# Patient Record
Sex: Male | Born: 1965 | Race: Black or African American | Hispanic: No | Smoking: Former smoker
Health system: Southern US, Community
[De-identification: ages and names within clinical notes are randomized; demographics above are authoritative.]

## PROBLEM LIST (undated history)

## (undated) DIAGNOSIS — J189 Pneumonia, unspecified organism: Secondary | ICD-10-CM

## (undated) DIAGNOSIS — C3411 Malignant neoplasm of upper lobe, right bronchus or lung: Secondary | ICD-10-CM

## (undated) DIAGNOSIS — E785 Hyperlipidemia, unspecified: Secondary | ICD-10-CM

## (undated) DIAGNOSIS — B1920 Unspecified viral hepatitis C without hepatic coma: Secondary | ICD-10-CM

## (undated) DIAGNOSIS — F1010 Alcohol abuse, uncomplicated: Secondary | ICD-10-CM

## (undated) DIAGNOSIS — I10 Essential (primary) hypertension: Secondary | ICD-10-CM

## (undated) HISTORY — PX: NO PAST SURGERIES: SHX2092

## (undated) HISTORY — DX: Unspecified viral hepatitis C without hepatic coma: B19.20

## (undated) HISTORY — DX: Hyperlipidemia, unspecified: E78.5

## (undated) HISTORY — DX: Alcohol abuse, uncomplicated: F10.10

## (undated) HISTORY — DX: Essential (primary) hypertension: I10

## (undated) HISTORY — DX: Malignant neoplasm of upper lobe, right bronchus or lung: C34.11

---

## 2000-09-09 ENCOUNTER — Emergency Department (HOSPITAL_COMMUNITY): Admission: EM | Admit: 2000-09-09 | Discharge: 2000-09-09 | Payer: Self-pay

## 2000-09-16 ENCOUNTER — Emergency Department (HOSPITAL_COMMUNITY): Admission: EM | Admit: 2000-09-16 | Discharge: 2000-09-16 | Payer: Self-pay | Admitting: Emergency Medicine

## 2004-12-17 ENCOUNTER — Emergency Department: Payer: Self-pay | Admitting: Emergency Medicine

## 2005-07-19 ENCOUNTER — Emergency Department: Payer: Self-pay | Admitting: Emergency Medicine

## 2005-07-20 ENCOUNTER — Emergency Department: Payer: Self-pay | Admitting: Emergency Medicine

## 2005-07-20 ENCOUNTER — Other Ambulatory Visit: Payer: Self-pay

## 2007-01-04 ENCOUNTER — Emergency Department: Payer: Self-pay | Admitting: Emergency Medicine

## 2009-06-20 ENCOUNTER — Emergency Department: Payer: Self-pay | Admitting: Emergency Medicine

## 2011-02-28 ENCOUNTER — Emergency Department: Payer: Self-pay | Admitting: Unknown Physician Specialty

## 2011-03-09 ENCOUNTER — Emergency Department: Payer: Self-pay | Admitting: Emergency Medicine

## 2011-04-14 ENCOUNTER — Emergency Department: Payer: Self-pay | Admitting: Internal Medicine

## 2011-11-19 ENCOUNTER — Ambulatory Visit: Payer: Self-pay | Admitting: Family Medicine

## 2011-11-23 LAB — HM HEPATITIS C SCREENING LAB: HM HEPATITIS C SCREENING: POSITIVE

## 2011-11-26 ENCOUNTER — Ambulatory Visit: Payer: Self-pay | Admitting: Family Medicine
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
## 2013-06-09 IMAGING — CR DG RIBS 2V*R*
1 series · 4 of 4 positions shown · non-contrast
Comparison: none

REASON FOR EXAM: rib pain
COMMENTS:

[Series 1: ap · 0.17mm/px · 4 of 4 slices shown]
[im 1/4]
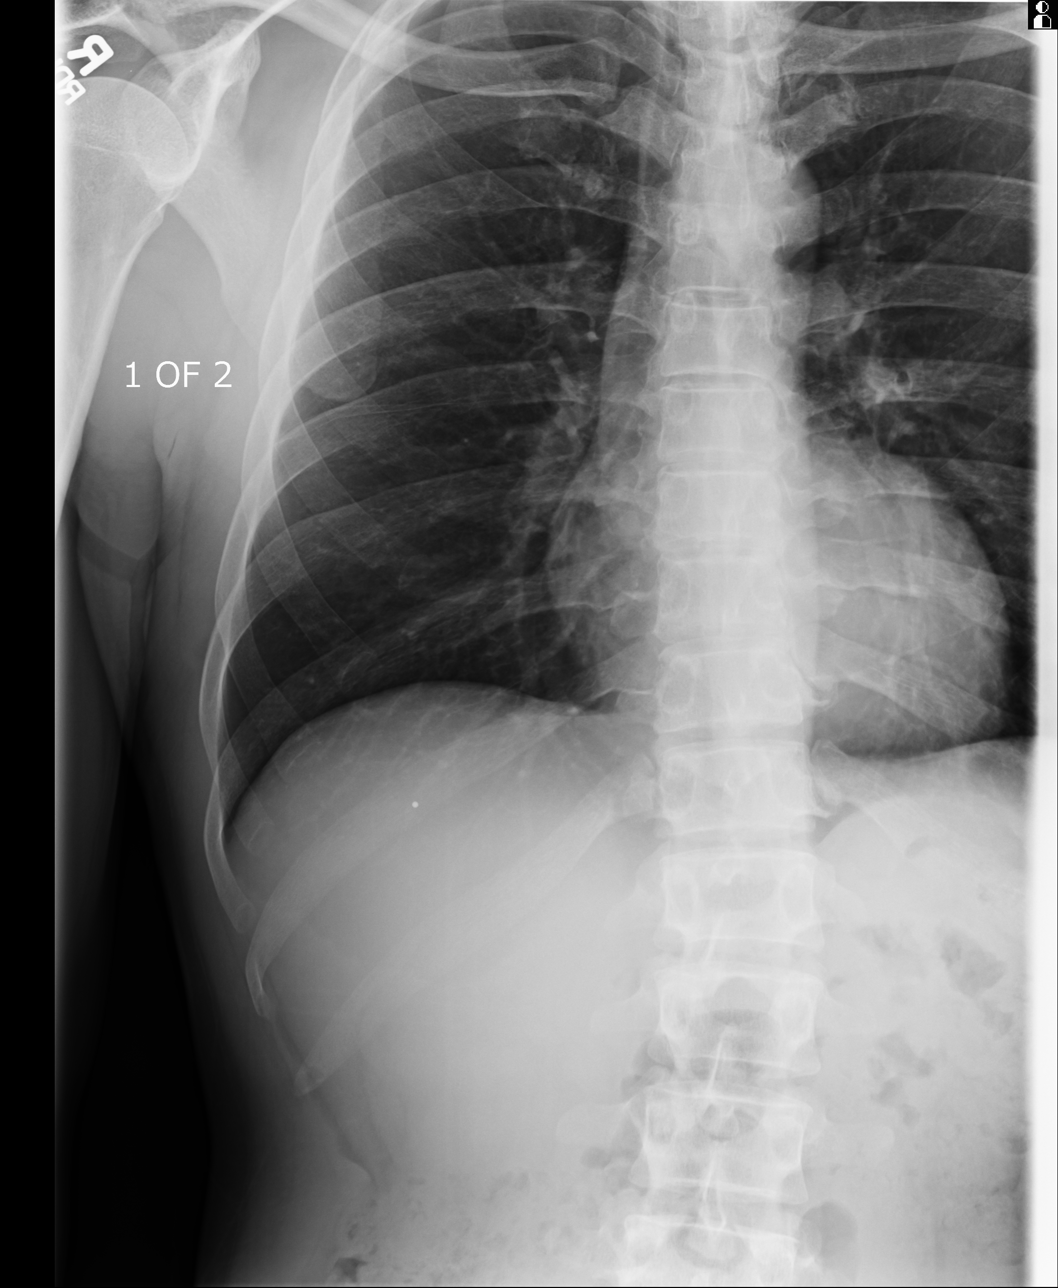
[im 2/4]
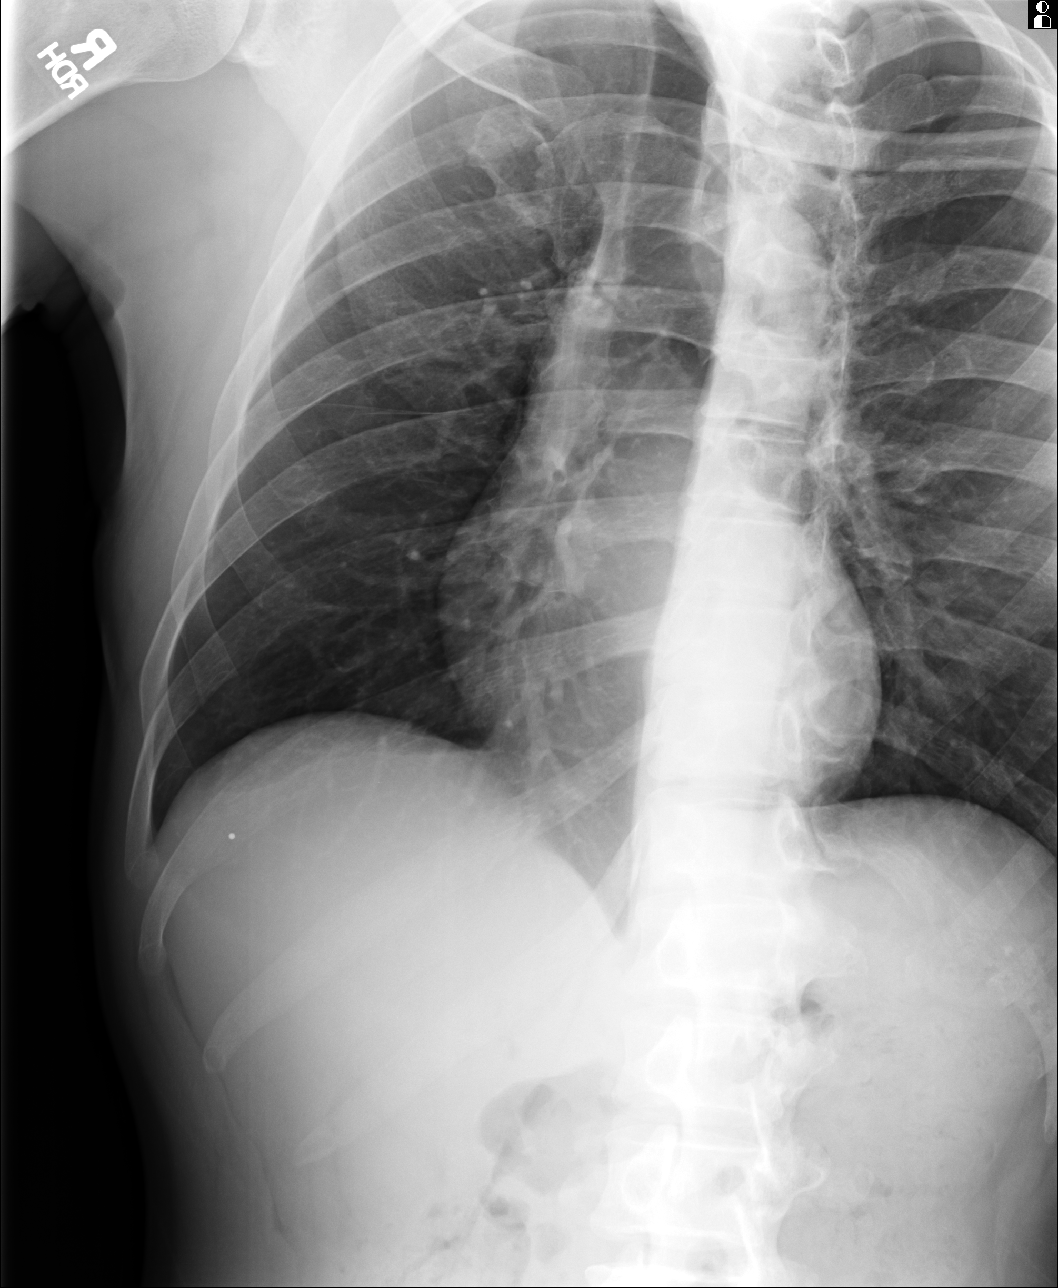
[im 3/4]
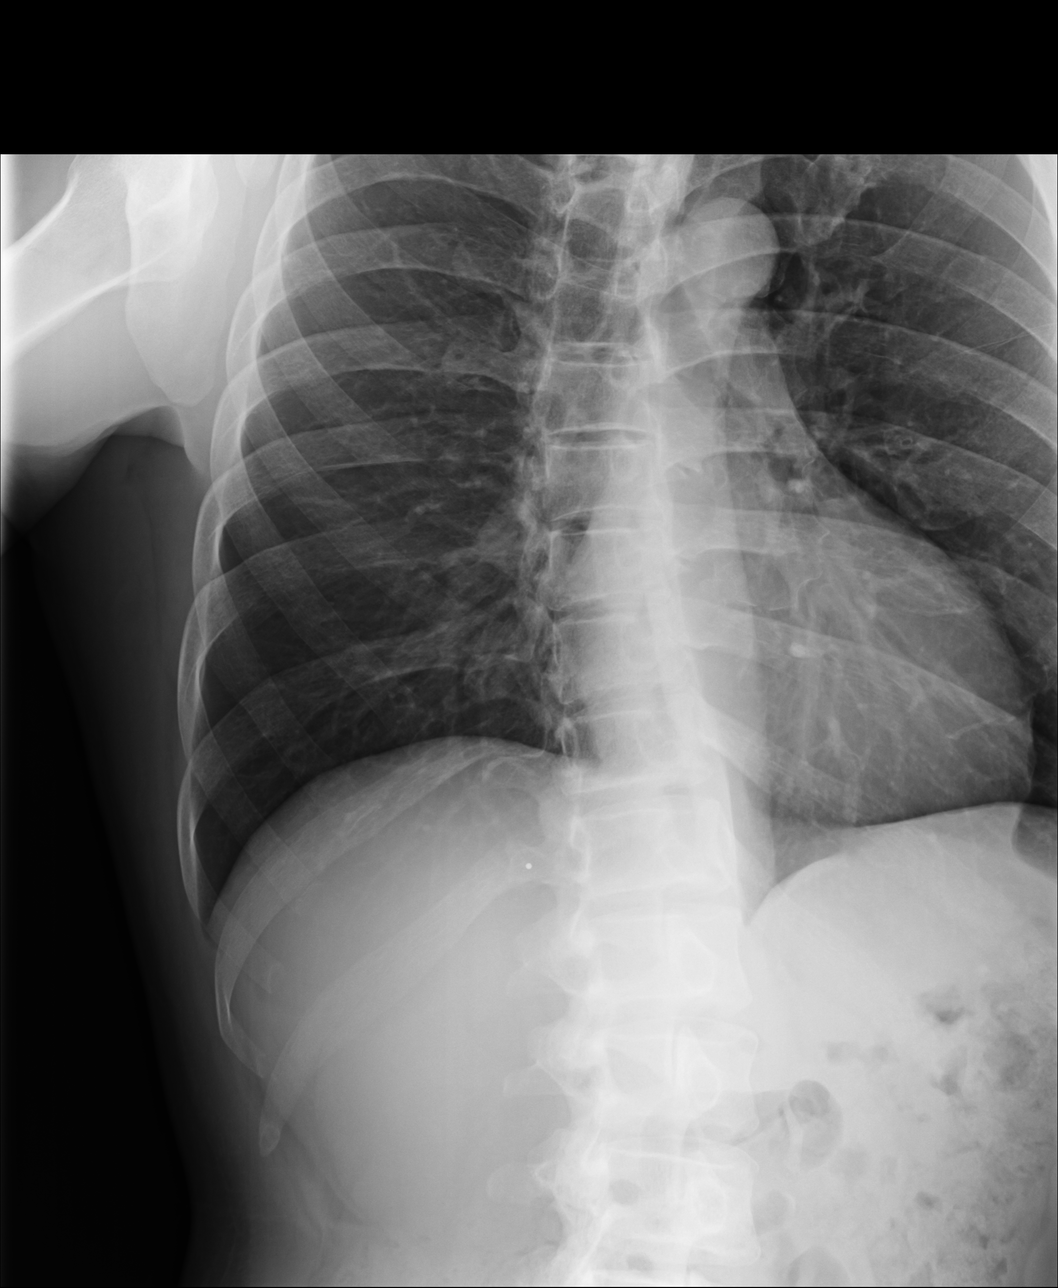
[im 4/4]
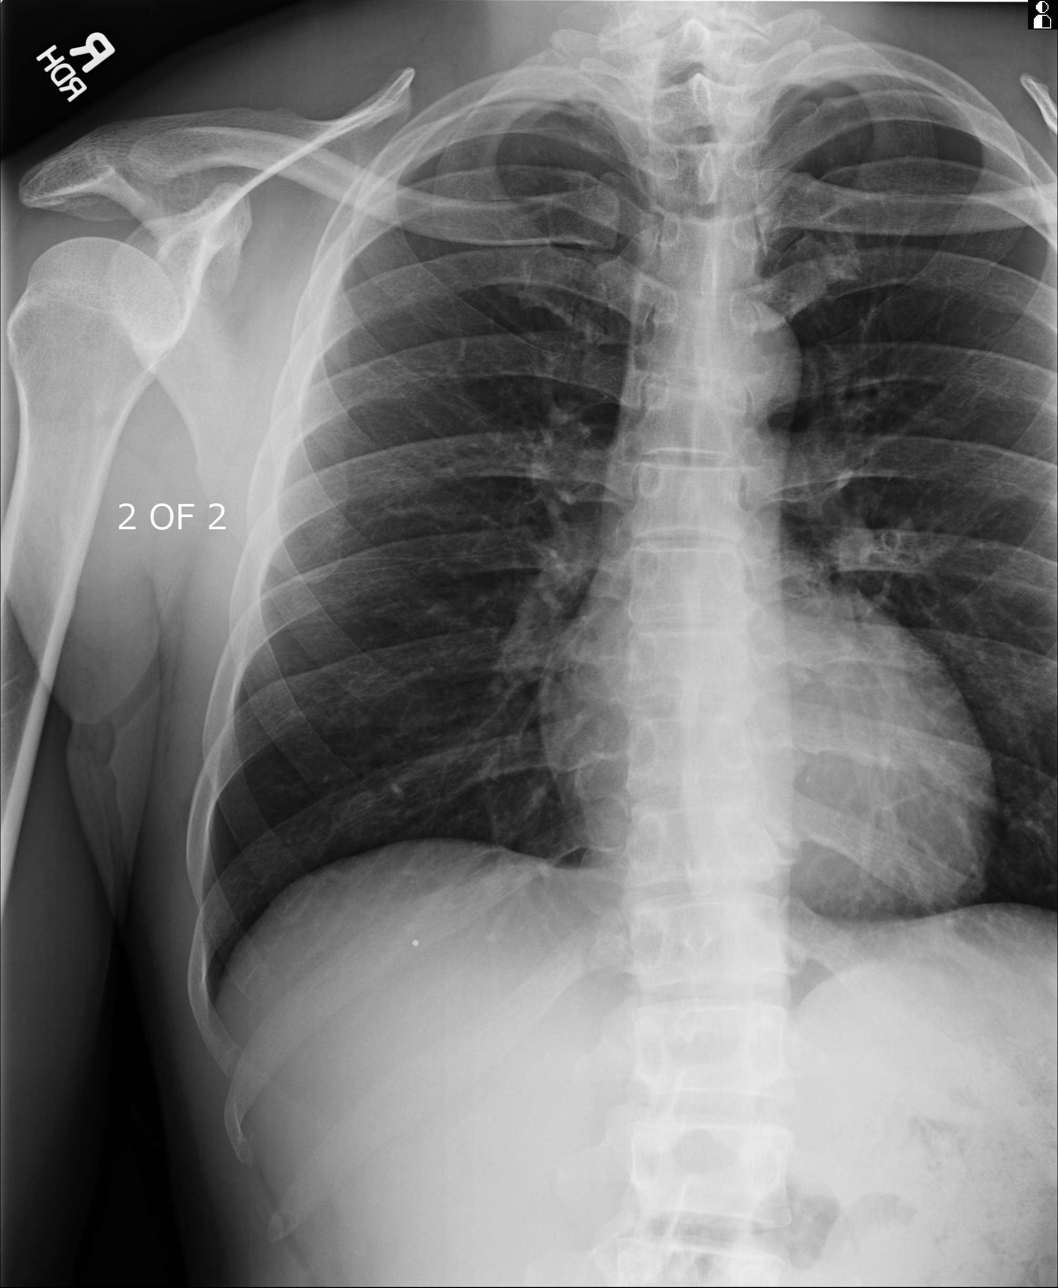

[4 of 4 positions shown; findings below may reference images not displayed]

PROCEDURE:     KDR - KDXR RIBS RIGHT UNILATERAL  - November 19, 2011 [DATE]

RESULT:     Multiple views of the right ribs were obtained. A metallic
marker was placed at the site of maximum symptomatology prior to
radiography. No fracture or other acute bony abnormality is identified. No
pneumothorax or pleural effusion is seen.
IMPRESSION: 1.     No rib fracture or other significant osseous abnormality of the right
ribs is identified.

## 2013-06-16 IMAGING — US ABDOMEN ULTRASOUND
1 series · 17 of 25 positions shown · non-contrast
Comparison: none

REASON FOR EXAM: complete   upper abd pain
COMMENTS:

[Series 1: abdomen ultrasound · 17 of 86 slices shown]
[im 1/86]
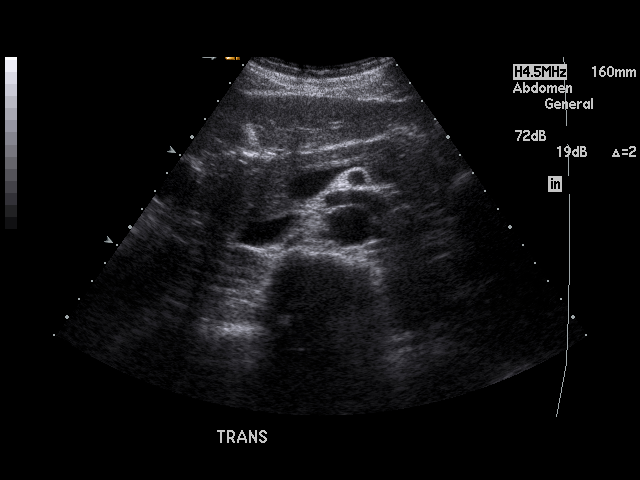
[im 8/86]
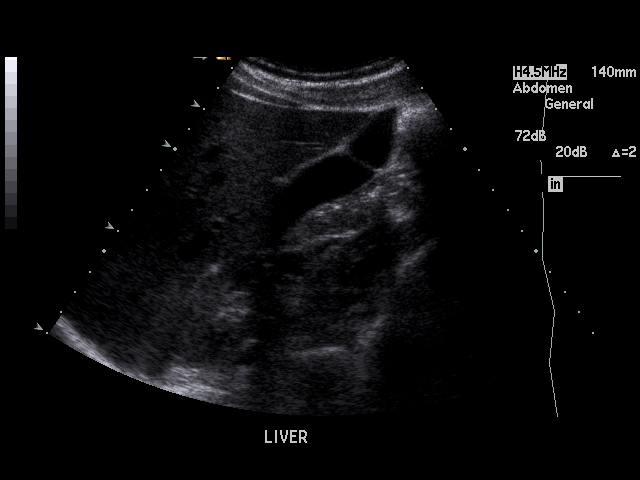
[im 11/86]
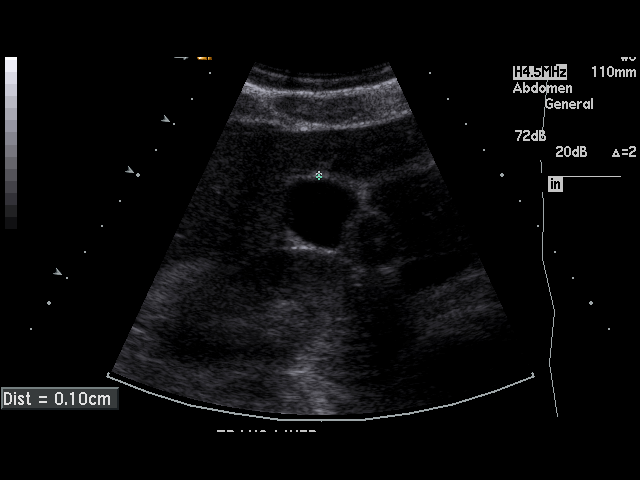
[im 18/86]
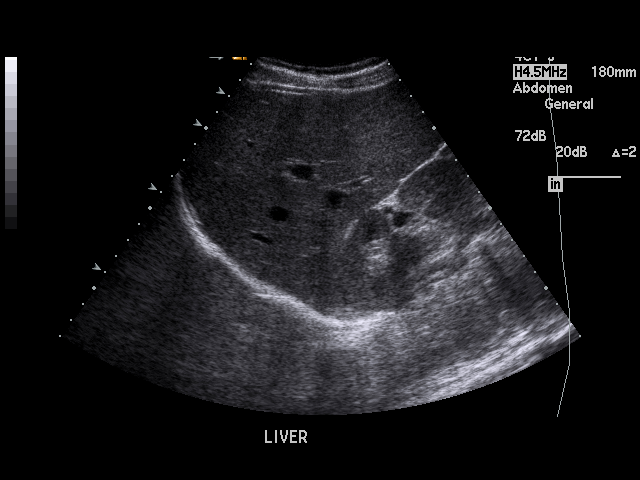
[im 22/86]
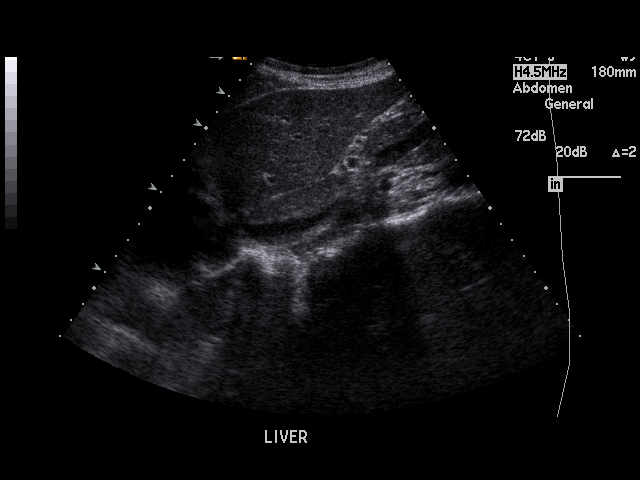
[im 29/86]
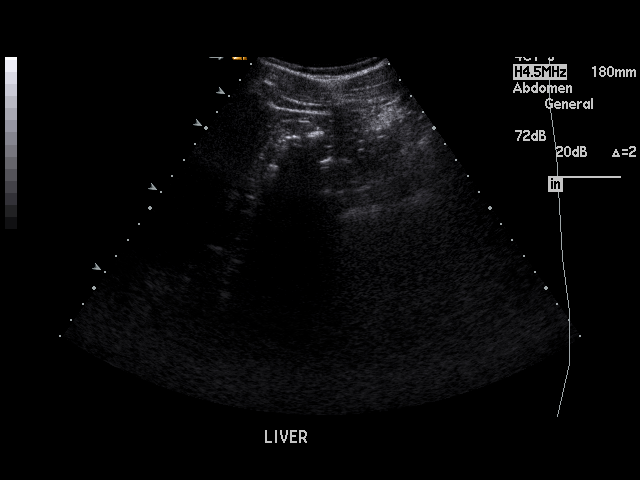
[im 32/86]
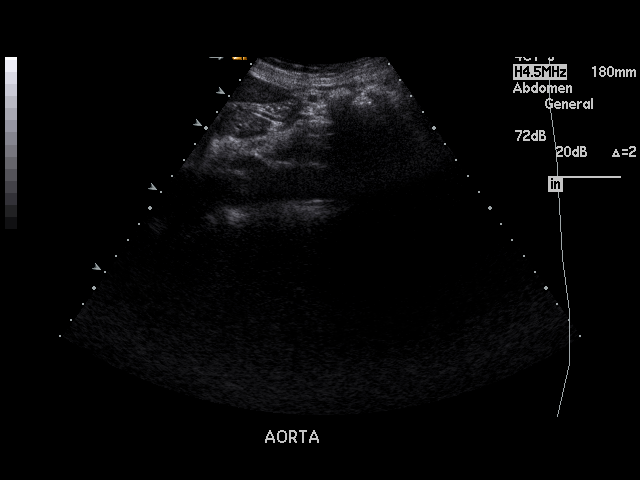
[im 39/86]
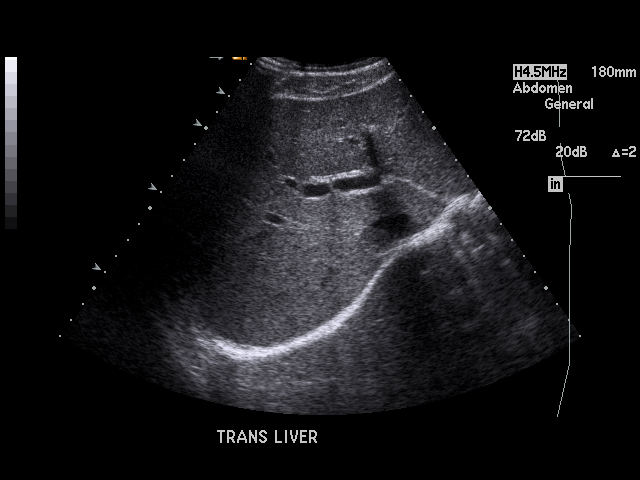
[im 43/86]
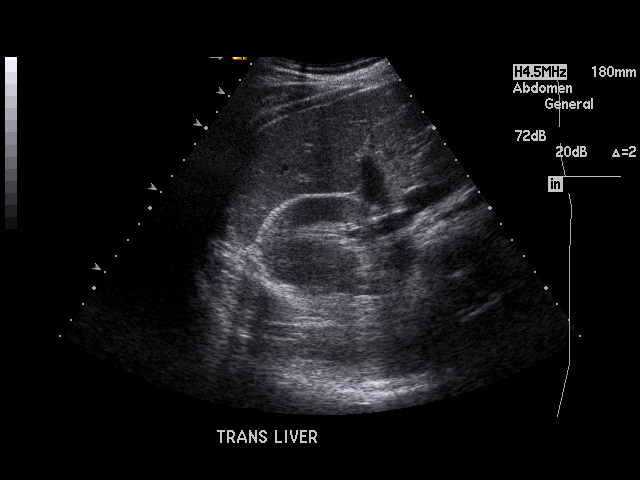
[im 47/86]
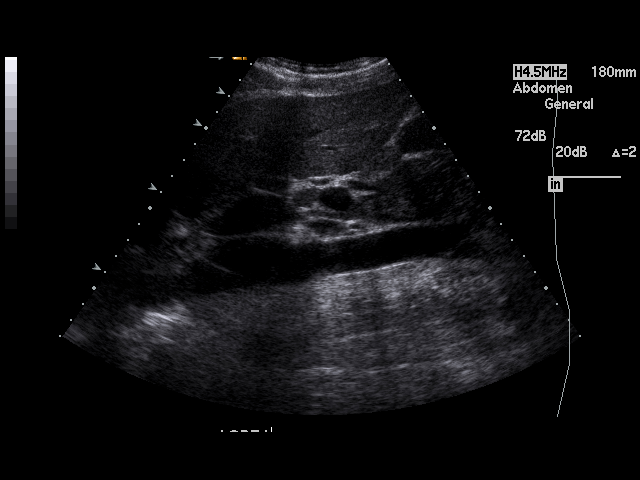
[im 54/86]
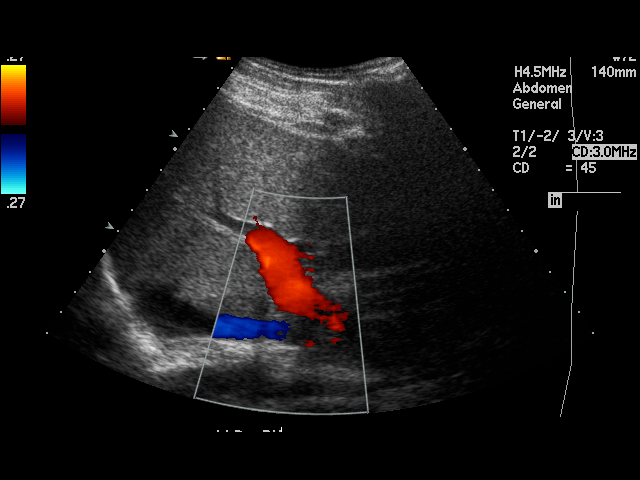
[im 57/86]
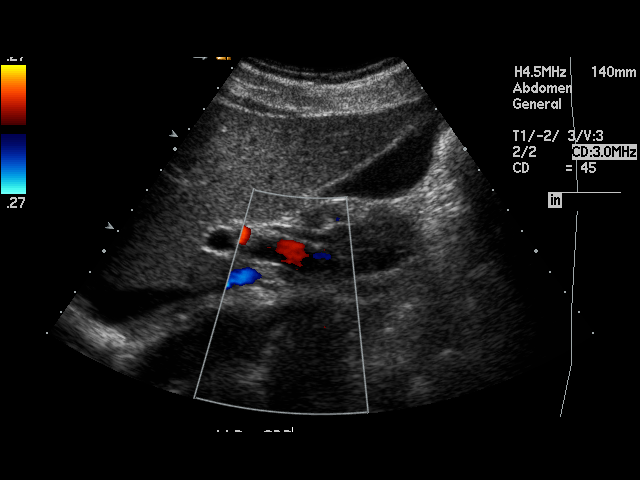
[im 64/86]
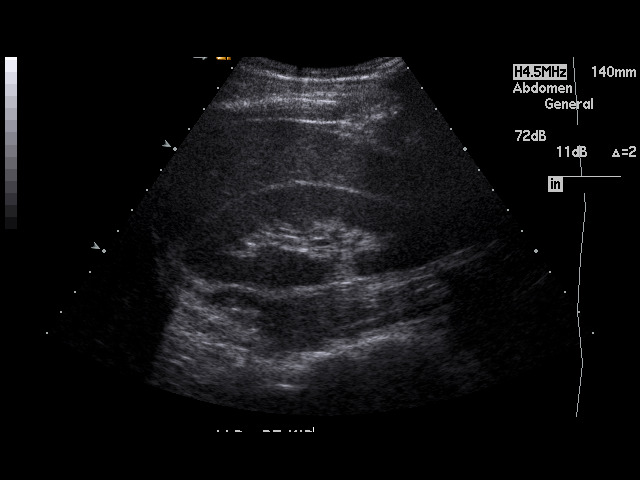
[im 68/86]
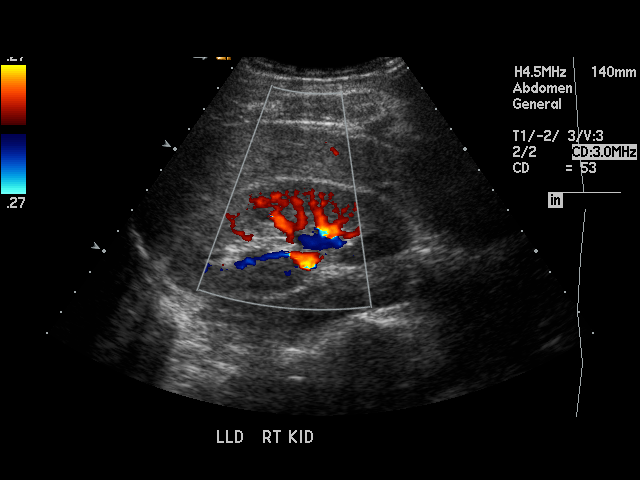
[im 75/86]
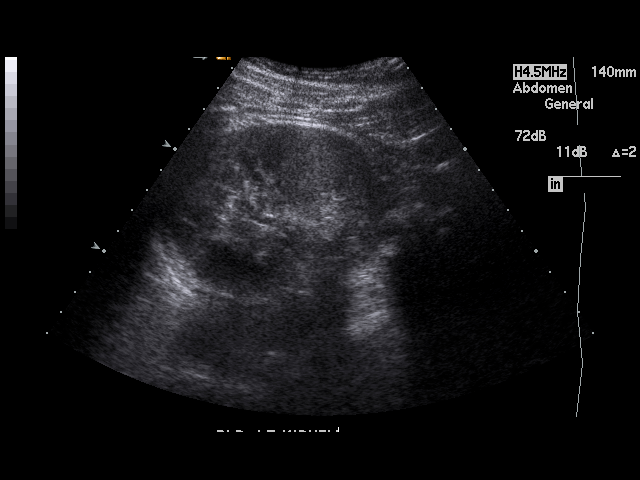
[im 78/86]
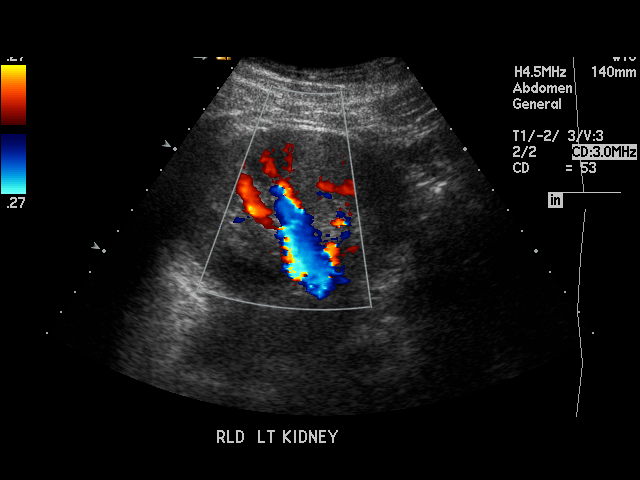
[im 86/86]
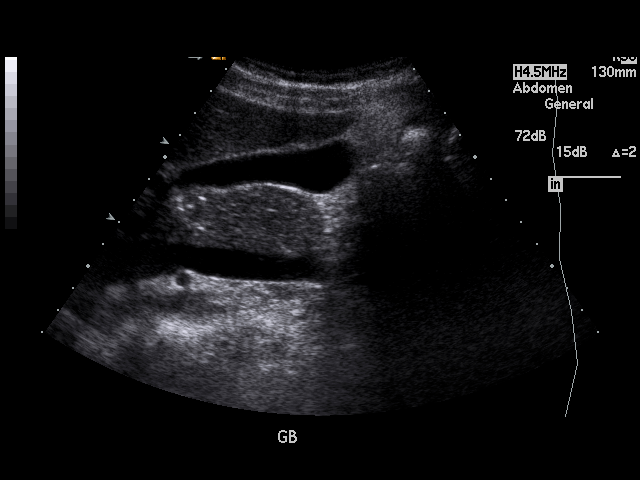

[17 of 25 positions shown; findings below may reference images not displayed]

PROCEDURE:     US  - US ABDOMEN GENERAL SURVEY  - November 26, 2011 [DATE]

RESULT:     The liver exhibits normal echotexture with no focal mass or
ductal dilation. Portal venous flow direction appears normal toward the
liver. The gallbladder is adequately distended with no evidence of stones,
wall thickening, or pericholecystic fluid. The common bile duct is normal at
5 mm in diameter.

The pancreas, spleen, abdominal aorta, inferior vena cava, and kidneys are
normal in appearance.
IMPRESSION: Normal abdominal ultrasound examination.

## 2014-09-16 ENCOUNTER — Ambulatory Visit: Payer: Self-pay | Admitting: Family Medicine

## 2014-09-16 LAB — CBC WITH DIFFERENTIAL/PLATELET
BASOS ABS: 0 10*3/uL (ref 0.0–0.1)
Basophil %: 0.9 %
EOS ABS: 0.1 10*3/uL (ref 0.0–0.7)
Eosinophil %: 2.9 %
HCT: 49.6 % (ref 40.0–52.0)
HGB: 15.8 g/dL (ref 13.0–18.0)
LYMPHS ABS: 2.3 10*3/uL (ref 1.0–3.6)
LYMPHS PCT: 49.3 %
MCH: 29.3 pg (ref 26.0–34.0)
MCHC: 31.9 g/dL — ABNORMAL LOW (ref 32.0–36.0)
MCV: 92 fL (ref 80–100)
MONOS PCT: 13.3 %
Monocyte #: 0.6 x10 3/mm (ref 0.2–1.0)
Neutrophil #: 1.6 10*3/uL (ref 1.4–6.5)
Neutrophil %: 33.6 %
Platelet: 136 10*3/uL — ABNORMAL LOW (ref 150–440)
RBC: 5.4 10*6/uL (ref 4.40–5.90)
RDW: 13.7 % (ref 11.5–14.5)
WBC: 4.6 10*3/uL (ref 3.8–10.6)

## 2014-09-16 LAB — COMPREHENSIVE METABOLIC PANEL
ALT: 138 U/L — AB
AST: 97 U/L — AB (ref 15–37)
Albumin: 3.8 g/dL (ref 3.4–5.0)
Alkaline Phosphatase: 53 U/L
Anion Gap: 4 — ABNORMAL LOW (ref 7–16)
BUN: 9 mg/dL (ref 7–18)
Bilirubin,Total: 0.4 mg/dL (ref 0.2–1.0)
CALCIUM: 9.3 mg/dL (ref 8.5–10.1)
Chloride: 106 mmol/L (ref 98–107)
Co2: 29 mmol/L (ref 21–32)
Creatinine: 0.96 mg/dL (ref 0.60–1.30)
EGFR (African American): >60
GLUCOSE: 105 mg/dL — AB (ref 65–99)
OSMOLALITY: 277 (ref 275–301)
Potassium: 4.1 mmol/L (ref 3.5–5.1)
Sodium: 139 mmol/L (ref 136–145)
Total Protein: 8 g/dL (ref 6.4–8.2)

## 2014-09-16 LAB — LIPID PANEL
CHOLESTEROL: 158 mg/dL (ref 0–200)
HDL Cholesterol: 67 mg/dL — ABNORMAL HIGH (ref 40–60)
LDL CHOLESTEROL, CALC: 83 mg/dL (ref 0–100)
Triglycerides: 42 mg/dL (ref 0–200)
VLDL Cholesterol, Calc: 8 mg/dL (ref 5–40)

## 2014-09-16 LAB — TSH: Thyroid Stimulating Horm: 1.23 u[IU]/mL

## 2014-09-17 LAB — PSA: PSA: 1.3 ng/mL (ref 0.0–4.0)

## 2014-10-23 ENCOUNTER — Ambulatory Visit: Payer: Self-pay | Admitting: Family Medicine

## 2014-10-23 LAB — COMPREHENSIVE METABOLIC PANEL
ANION GAP: 4 — AB (ref 7–16)
AST: 76 U/L — AB (ref 15–37)
Albumin: 3.4 g/dL (ref 3.4–5.0)
Alkaline Phosphatase: 48 U/L
BILIRUBIN TOTAL: 0.5 mg/dL (ref 0.2–1.0)
BUN: 9 mg/dL (ref 7–18)
CHLORIDE: 106 mmol/L (ref 98–107)
CO2: 30 mmol/L (ref 21–32)
Calcium, Total: 8.8 mg/dL (ref 8.5–10.1)
Creatinine: 0.94 mg/dL (ref 0.60–1.30)
Glucose: 81 mg/dL (ref 65–99)
Osmolality: 277 (ref 275–301)
Potassium: 3.7 mmol/L (ref 3.5–5.1)
SGPT (ALT): 99 U/L — ABNORMAL HIGH
Sodium: 140 mmol/L (ref 136–145)
Total Protein: 7.3 g/dL (ref 6.4–8.2)

## 2015-09-15 DIAGNOSIS — Z720 Tobacco use: Secondary | ICD-10-CM | POA: Insufficient documentation

## 2015-09-16 ENCOUNTER — Encounter: Payer: Self-pay | Admitting: Family Medicine

## 2015-09-16 ENCOUNTER — Ambulatory Visit (INDEPENDENT_AMBULATORY_CARE_PROVIDER_SITE_OTHER): Payer: PRIVATE HEALTH INSURANCE | Admitting: Family Medicine

## 2015-09-16 VITALS — BP 138/90 | HR 62 | Temp 97.9°F | Resp 14 | Wt 143.6 lb

## 2015-09-16 DIAGNOSIS — Z202 Contact with and (suspected) exposure to infections with a predominantly sexual mode of transmission: Secondary | ICD-10-CM

## 2015-09-16 MED ORDER — AZITHROMYCIN 1 G PO PACK: 2.0000 g | PACK | Freq: Once | ORAL | Status: DC

## 2015-09-16 NOTE — Progress Notes (Signed)
Patient ID: Nicholas Mccoy, male   DOB: October 21, 1965, 49 y.o.   MRN: [id number] Name: Nicholas Mccoy   MRN: [id number]    DOB: October 21, 1965   Date:09/16/2015       Progress Note  Subjective  Chief Complaint  Chief Complaint  Patient presents with  . Exposure to STD    Exposure to STD The patient's pertinent negatives include no genital itching or penile discharge. This is a new problem. The current episode started in the past 7 days. The patient is experiencing no pain. He is sexually active. Yes, his partner has an STD.   Past Surgical History  Procedure Laterality Date  . No past surgeries     Family History  Problem Relation Age of Onset  . Hypertension Mother   . Hypertension Father   . Heart disease Father   . Heart attack Father   . Heart attack Sister   . Hypertension Brother   . Healthy Brother   . Sickle cell anemia Sister    Patient Active Problem List   Diagnosis Date Noted  . Current tobacco use 09/15/2015    Social History  Substance Use Topics  . Smoking status: Current Every Day Smoker -- 0.50 packs/day for 25 years    Types: Cigarettes  . Smokeless tobacco: Never Used  . Alcohol Use: 0.0 oz/week    0 Standard drinks or equivalent per week     Comment: Moderate Use- 10 beers on the weekends    No current outpatient prescriptions on file.  No Known Allergies  Review of Systems  Constitutional: Negative.   HENT: Negative.   Eyes: Negative.   Respiratory: Negative.   Cardiovascular: Negative.   Gastrointestinal: Negative.   Genitourinary: Negative.  Negative for discharge.  Musculoskeletal: Negative.   Skin: Negative.   Neurological: Negative.   Endo/Heme/Allergies: Negative.   Psychiatric/Behavioral: Negative.     Objective  Filed Vitals:   09/16/15 1041  BP: 138/90  Pulse: 62  Temp: 97.9 F (36.6 C)  TempSrc: Oral  Resp: 14  Weight: 143 lb 9.6 oz (65.137 kg)  SpO2: 98%   Physical Exam  Constitutional: He is oriented to  person, place, and time and well-developed, well-nourished, and in no distress.  HENT:  Head: Normocephalic.  Eyes: Conjunctivae and EOM are normal.  White arcus bilaterally.  Neck: Neck supple.  Cardiovascular: Normal rate and regular rhythm.   Pulmonary/Chest: Effort normal and breath sounds normal.  Abdominal: Soft. Bowel sounds are normal.  Genitourinary: Penis normal. No discharge found.  Musculoskeletal: Normal range of motion.  Lymphadenopathy:    He has no cervical adenopathy.  Neurological: He is alert and oriented to person, place, and time.  Skin: No rash noted.  Psychiatric: Memory, affect and judgment normal.    Assessment & Plan  1. Exposure to STD Girlfriend diagnosed with chlamydia infection (patient unsure if it may have been trichomonas) and was advised to be checked for infection. No symptoms of dysuria or urethral discharge. He has no other sexual partners for the past 12 years. Will treat with azithromycin and get HIV antibody and NAA Chlamydia/Gonococcu/Trichomonas test. Recheck pending lab results. - HIV antibody (with reflex) - azithromycin (ZITHROMAX) 1 G powder; Take 2 packets (2 g total) by mouth once.  Dispense: 2 each; Refill: 0 - Chlamydia/Gonococcus/Trichomonas, NAA

## 2015-09-16 NOTE — Patient Instructions (Signed)
Sexually Transmitted Disease °A sexually transmitted disease (STD) is a disease or infection that may be passed (transmitted) from person to person, usually during sexual activity. This may happen by way of saliva, semen, blood, vaginal mucus, or urine. Common STDs include: °· Gonorrhea. °· Chlamydia. °· Syphilis. °· HIV and AIDS. °· Genital herpes. °· Hepatitis B and C. °· Trichomonas. °· Human papillomavirus (HPV). °· Pubic lice. °· Scabies. °· Mites. °· Bacterial vaginosis. °WHAT ARE CAUSES OF STDs? °An STD may be caused by bacteria, a virus, or parasites. STDs are often transmitted during sexual activity if one person is infected. However, they may also be transmitted through nonsexual means. STDs may be transmitted after:  °· Sexual intercourse with an infected person. °· Sharing sex toys with an infected person. °· Sharing needles with an infected person or using unclean piercing or tattoo needles. °· Having intimate contact with the genitals, mouth, or rectal areas of an infected person. °· Exposure to infected fluids during birth. °WHAT ARE THE SIGNS AND SYMPTOMS OF STDs? °Different STDs have different symptoms. Some people may not have any symptoms. If symptoms are present, they may include: °· Painful or bloody urination. °· Pain in the pelvis, abdomen, vagina, anus, throat, or eyes. °· A skin rash, itching, or irritation. °· Growths, ulcerations, blisters, or sores in the genital and anal areas. °· Abnormal vaginal discharge with or without bad odor. °· Penile discharge in men. °· Fever. °· Pain or bleeding during sexual intercourse. °· Swollen glands in the groin area. °· Yellow skin and eyes (jaundice). This is seen with hepatitis. °· Swollen testicles. °· Infertility. °· Sores and blisters in the mouth. °HOW ARE STDs DIAGNOSED? °To make a diagnosis, your health care provider may: °· Take a medical history. °· Perform a physical exam. °· Take a sample of any discharge to examine. °· Swab the throat,  cervix, opening to the penis, rectum, or vagina for testing. °· Test a sample of your first morning urine. °· Perform blood tests. °· Perform a Pap test, if this applies. °· Perform a colposcopy. °· Perform a laparoscopy. °HOW ARE STDs TREATED? °Treatment depends on the STD. Some STDs may be treated but not cured. °· Chlamydia, gonorrhea, trichomonas, and syphilis can be cured with antibiotic medicine. °· Genital herpes, hepatitis, and HIV can be treated, but not cured, with prescribed medicines. The medicines lessen symptoms. °· Genital warts from HPV can be treated with medicine or by freezing, burning (electrocautery), or surgery. Warts may come back. °· HPV cannot be cured with medicine or surgery. However, abnormal areas may be removed from the cervix, vagina, or vulva. °· If your diagnosis is confirmed, your recent sexual partners need treatment. This is true even if they are symptom-free or have a negative culture or evaluation. They should not have sex until their health care providers say it is okay. °· Your health care provider may test you for infection again 3 months after treatment. °HOW CAN I REDUCE MY RISK OF GETTING AN STD? °Take these steps to reduce your risk of getting an STD: °· Use latex condoms, dental dams, and water-soluble lubricants during sexual activity. Do not use petroleum jelly or oils. °· Avoid having multiple sex partners. °· Do not have sex with someone who has other sex partners °· Do not have sex with anyone you do not know or who is at high risk for an STD. °· Avoid risky sex practices that can break your skin. °· Do not have sex   if you have open sores on your mouth or skin. °· Avoid drinking too much alcohol or taking illegal drugs. Alcohol and drugs can affect your judgment and put you in a vulnerable position. °· Avoid engaging in oral and anal sex acts. °· Get vaccinated for HPV and hepatitis. If you have not received these vaccines in the past, talk to your health care  provider about whether one or both might be right for you. °· If you are at risk of being infected with HIV, it is recommended that you take a prescription medicine daily to prevent HIV infection. This is called pre-exposure prophylaxis (PrEP). You are considered at risk if: °¨ You are a man who has sex with other men (MSM). °¨ You are a heterosexual man or woman and are sexually active with more than one partner. °¨ You take drugs by injection. °¨ You are sexually active with a partner who has HIV. °· Talk with your health care provider about whether you are at high risk of being infected with HIV. If you choose to begin PrEP, you should first be tested for HIV. You should then be tested every 3 months for as long as you are taking PrEP. °WHAT SHOULD I DO IF I THINK I HAVE AN STD? °· See your health care provider. °· Tell your sexual partner(s). They should be tested and treated for any STDs. °· Do not have sex until your health care provider says it is okay. °WHEN SHOULD I GET IMMEDIATE MEDICAL CARE? °Contact your health care provider right away if:  °· You have severe abdominal pain. °· You are a man and notice swelling or pain in your testicles. °· You are a woman and notice swelling or pain in your vagina. °  °This information is not intended to replace advice given to you by your health care provider. Make sure you discuss any questions you have with your health care provider. °  °Document Released: 12/18/2002 Document Revised: 10/18/2014 Document Reviewed: 04/17/2013 °Elsevier Interactive Patient Education ©2016 [employer] ° °

## 2015-09-18 ENCOUNTER — Telehealth: Payer: Self-pay

## 2015-09-18 LAB — CHLAMYDIA/GONOCOCCUS/TRICHOMONAS, NAA
Chlamydia by NAA: NEGATIVE
GONOCOCCUS BY NAA: NEGATIVE
Trich vag by NAA: NEGATIVE

## 2015-09-18 NOTE — Telephone Encounter (Signed)
Patient advised as directed below. Patient verbalized understanding.  

## 2015-09-18 NOTE — Telephone Encounter (Signed)
LMTCB

## 2015-09-18 NOTE — Telephone Encounter (Signed)
-----   Message from Tamsen Roersennis E Chrismon, [place] sent at 09/18/2015  8:21 AM EST ----- No sign of any infections. The antibiotic given should prevent any STD problems.

## 2016-04-26 ENCOUNTER — Ambulatory Visit (INDEPENDENT_AMBULATORY_CARE_PROVIDER_SITE_OTHER): Payer: PRIVATE HEALTH INSURANCE | Admitting: Family Medicine

## 2016-04-26 ENCOUNTER — Encounter: Payer: Self-pay | Admitting: Family Medicine

## 2016-04-26 VITALS — BP 124/78 | HR 68 | Temp 98.2°F | Resp 16 | Wt 138.0 lb

## 2016-04-26 DIAGNOSIS — G44209 Tension-type headache, unspecified, not intractable: Secondary | ICD-10-CM

## 2016-04-26 MED ORDER — CYCLOBENZAPRINE HCL 5 MG PO TABS: 5.0000 mg | ORAL_TABLET | Freq: Three times a day (TID) | ORAL | Status: DC | PRN

## 2016-04-26 MED ORDER — IBUPROFEN 800 MG PO TABS: 800.0000 mg | ORAL_TABLET | Freq: Three times a day (TID) | ORAL | Status: DC | PRN

## 2016-04-26 NOTE — Patient Instructions (Signed)
Tension Headache A tension headache is a feeling of pain, pressure, or aching that is often felt over the front and sides of the head. The pain can be dull, or it can feel tight (constricting). Tension headaches are not normally associated with nausea or vomiting, and they do not get worse with physical activity. Tension headaches can last from 30 minutes to several days. This is the most common type of headache. CAUSES The exact cause of this condition is not known. Tension headaches often begin after stress, anxiety, or depression. Other triggers may include:  Alcohol.  Too much caffeine, or caffeine withdrawal.  Respiratory infections, such as colds, flu, or sinus infections.  Dental problems or teeth clenching.  Fatigue.  Holding your head and neck in the same position for a long period of time, such as while using a computer.  Smoking. SYMPTOMS Symptoms of this condition include:  A feeling of pressure around the head.  Dull, aching head pain.  Pain felt over the front and sides of the head.  Tenderness in the muscles of the head, neck, and shoulders. DIAGNOSIS This condition may be diagnosed based on your symptoms and a physical exam. Tests may be done, such as a CT scan or an MRI of your head. These tests may be done if your symptoms are severe or unusual. TREATMENT This condition may be treated with lifestyle changes and medicines to help relieve symptoms. HOME CARE INSTRUCTIONS Managing Pain  Take over-the-counter and prescription medicines only as told by your health care provider.  Lie down in a dark, quiet room when you have a headache.  If directed, apply ice to the head and neck area:  Put ice in a plastic bag.  Place a towel between your skin and the bag.  Leave the ice on for 20 minutes, 2-3 times per day.  Use a heating pad or a hot shower to apply heat to the head and neck area as told by your health care provider. Eating and Drinking  Eat meals on  a regular schedule.  Limit alcohol use.  Decrease your caffeine intake, or stop using caffeine. General Instructions  Keep all follow-up visits as told by your health care provider. This is important.  Keep a headache journal to help find out what may trigger your headaches. For example, write down:  What you eat and drink.  How much sleep you get.  Any change to your diet or medicines.  Try massage or other relaxation techniques.  Limit stress.  Sit up straight, and avoid tensing your muscles.  Do not use tobacco products, including cigarettes, chewing tobacco, or e-cigarettes. If you need help quitting, ask your health care provider.  Exercise regularly as told by your health care provider.  Get 7-9 hours of sleep, or the amount recommended by your health care provider. SEEK MEDICAL CARE IF:  Your symptoms are not helped by medicine.  You have a headache that is different from what you normally experience.  You have nausea or you vomit.  You have a fever. SEEK IMMEDIATE MEDICAL CARE IF:  Your headache becomes severe.  You have repeated vomiting.  You have a stiff neck.  You have a loss of vision.  You have problems with speech.  You have pain in your eye or ear.  You have muscular weakness or loss of muscle control.  You lose your balance or you have trouble walking.  You feel faint or you pass out.  You have confusion.     This information is not intended to replace advice given to you by your health care provider. Make sure you discuss any questions you have with your health care provider.   Document Released: 09/27/2005 Document Revised: 06/18/2015 Document Reviewed: 01/20/2015 Elsevier Interactive Patient Education 2016 Elsevier Inc.  

## 2016-04-26 NOTE — Progress Notes (Signed)
Patient: Nicholas RenWilliam B Mccoy Male    DOB: Jul 13, 1966   50 y.o.   MRN: [id number] Visit Date: 04/26/2016  Today's Provider: Dortha Kernennis Chrismon, PA   No chief complaint on file.  Subjective:    Headache  This is a new problem. The current episode started today. The problem occurs constantly. The pain is located in the temporal region. The pain radiates to the left neck. The pain quality is not similar to prior headaches. The quality of the pain is described as aching and sharp. The pain is moderate. Associated symptoms include ear pain. Pertinent negatives include no rhinorrhea. He has tried nothing for the symptoms.    No past medical history on file. Patient Active Problem List   Diagnosis Date Noted  . Current tobacco use 09/15/2015   Past Surgical History  Procedure Laterality Date  . No past surgeries     Family History  Problem Relation Age of Onset  . Hypertension Mother   . Hypertension Father   . Heart disease Father   . Heart attack Father   . Heart attack Sister   . Hypertension Brother   . Healthy Brother   . Sickle cell anemia Sister     No Known Allergies No current outpatient prescriptions on file prior to visit.   No current facility-administered medications on file prior to visit.   Review of Systems  Constitutional: Negative.   HENT: Positive for ear pain and nosebleeds. Negative for congestion, ear discharge, postnasal drip and rhinorrhea.   Respiratory: Negative.   Gastrointestinal: Negative.   Neurological: Positive for headaches.       Left temporal to occiput throbbing pressure pain today.   Social History  Substance Use Topics  . Smoking status: Current Every Day Smoker -- 0.50 packs/day for 25 years    Types: Cigarettes  . Smokeless tobacco: Never Used  . Alcohol Use: 0.0 oz/week    0 Standard drinks or equivalent per week     Comment: Moderate Use- 10 beers on the weekends   Objective:   BP 124/78 mmHg  Pulse 68  Temp(Src) 98.2  F (36.8 C)  Resp 16  Wt 138 lb (62.596 kg)  Physical Exam  Constitutional: He is oriented to person, place, and time. He appears well-developed and well-nourished. No distress.  HENT:  Head: Normocephalic and atraumatic.  Right Ear: Hearing and external ear normal.  Left Ear: Hearing and external ear normal.  Nose: Nose normal.  Mouth/Throat: Oropharynx is clear and moist.  Tender left temple back to the left occiput.  Eyes: Conjunctivae and lids are normal. Right eye exhibits no discharge. Left eye exhibits no discharge. No scleral icterus.  Neck: Neck supple. No thyromegaly present.  Cardiovascular: Normal rate and regular rhythm.   Pulmonary/Chest: Effort normal and breath sounds normal. No respiratory distress.  Abdominal: Bowel sounds are normal.  Musculoskeletal: Normal range of motion.  Lymphadenopathy:    He has no cervical adenopathy.  Neurological: He is alert and oriented to person, place, and time.  Skin: Skin is intact. No lesion and no rash noted.  Psychiatric: He has a normal mood and affect. His speech is normal and behavior is normal. Thought content normal.      Assessment & Plan:     1. Tension-type headache, not intractable, unspecified chronicity pattern Onset today with tenderness to palpate scalp in the left temple and occipital area. Full ROM of neck, no nystagmus, nausea or vomiting. May apply ice pack  or moist heat and be sure muscle relaxer and Ibuprofen does not cause too much sleepiness. Recheck prn. - cyclobenzaprine (FLEXERIL) 5 MG tablet; Take 1 tablet (5 mg total) by mouth 3 (three) times daily as needed for muscle spasms.  Dispense: 20 tablet; Refill: 0 - ibuprofen (ADVIL,MOTRIN) 800 MG tablet; Take 1 tablet (800 mg total) by mouth every 8 (eight) hours as needed for headache.  Dispense: 30 tablet; Refill: 0       Dortha Kern, PA  [facility]

## 2016-05-13 IMAGING — CR DG SHOULDER 3+V*R*
1 series · 3 of 3 positions shown · non-contrast
Comparison: None.

CLINICAL DATA: Right shoulder pain, no injury

EXAM:
DG SHOULDER 3+ VIEWS RIGHT

[Series 1: w shoulder grashey right · 0.14mm/px · 3 of 3 slices shown]
[im 1/3]
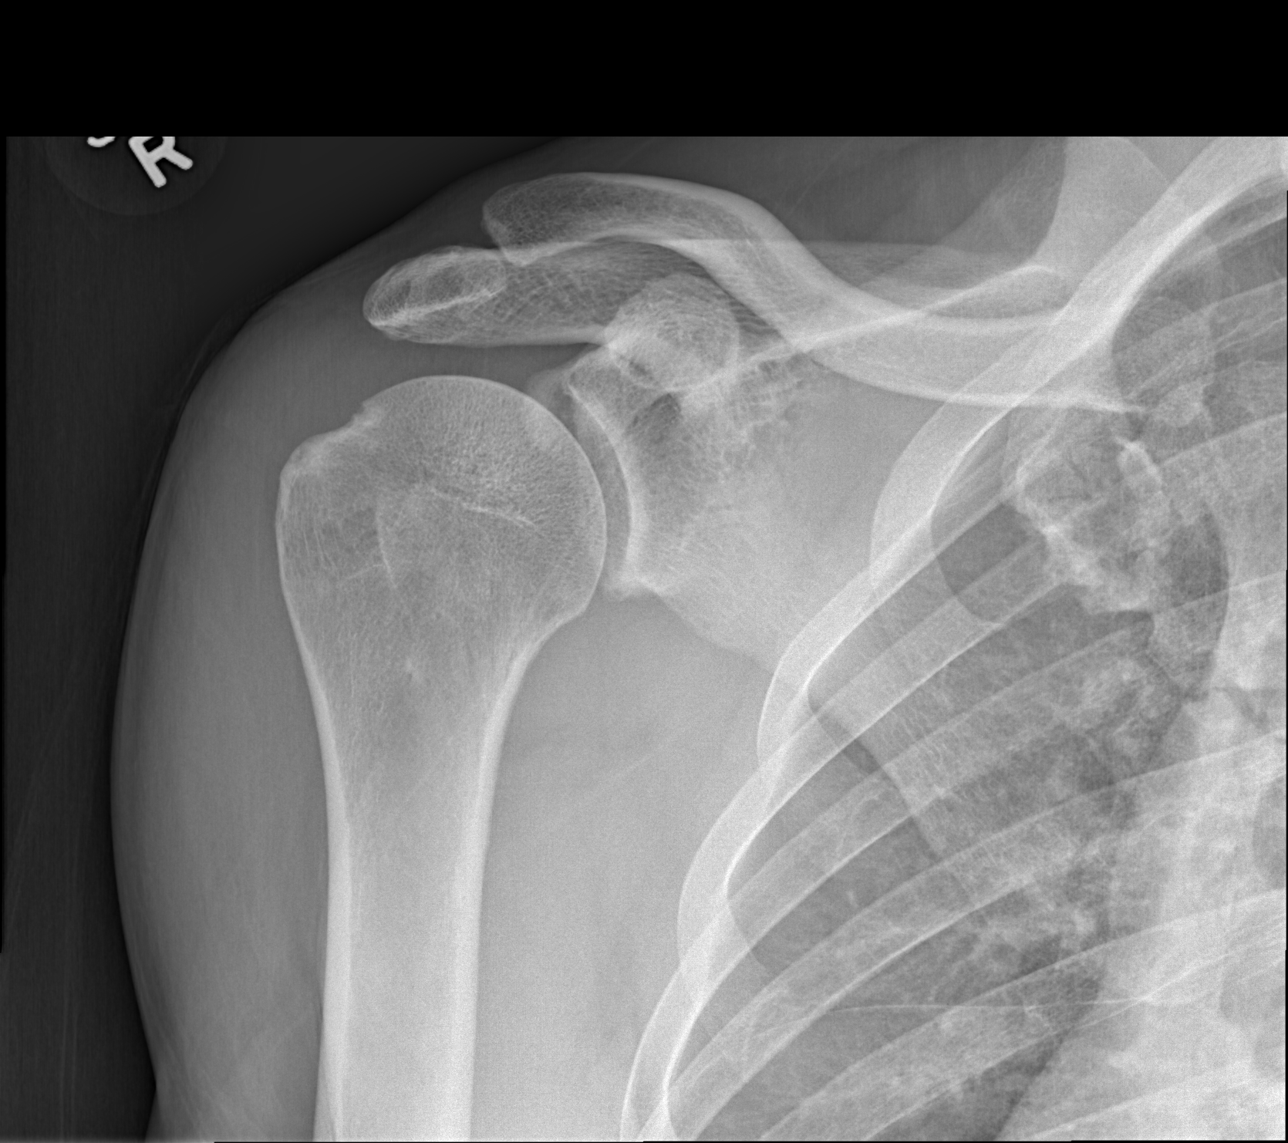
[im 2/3]
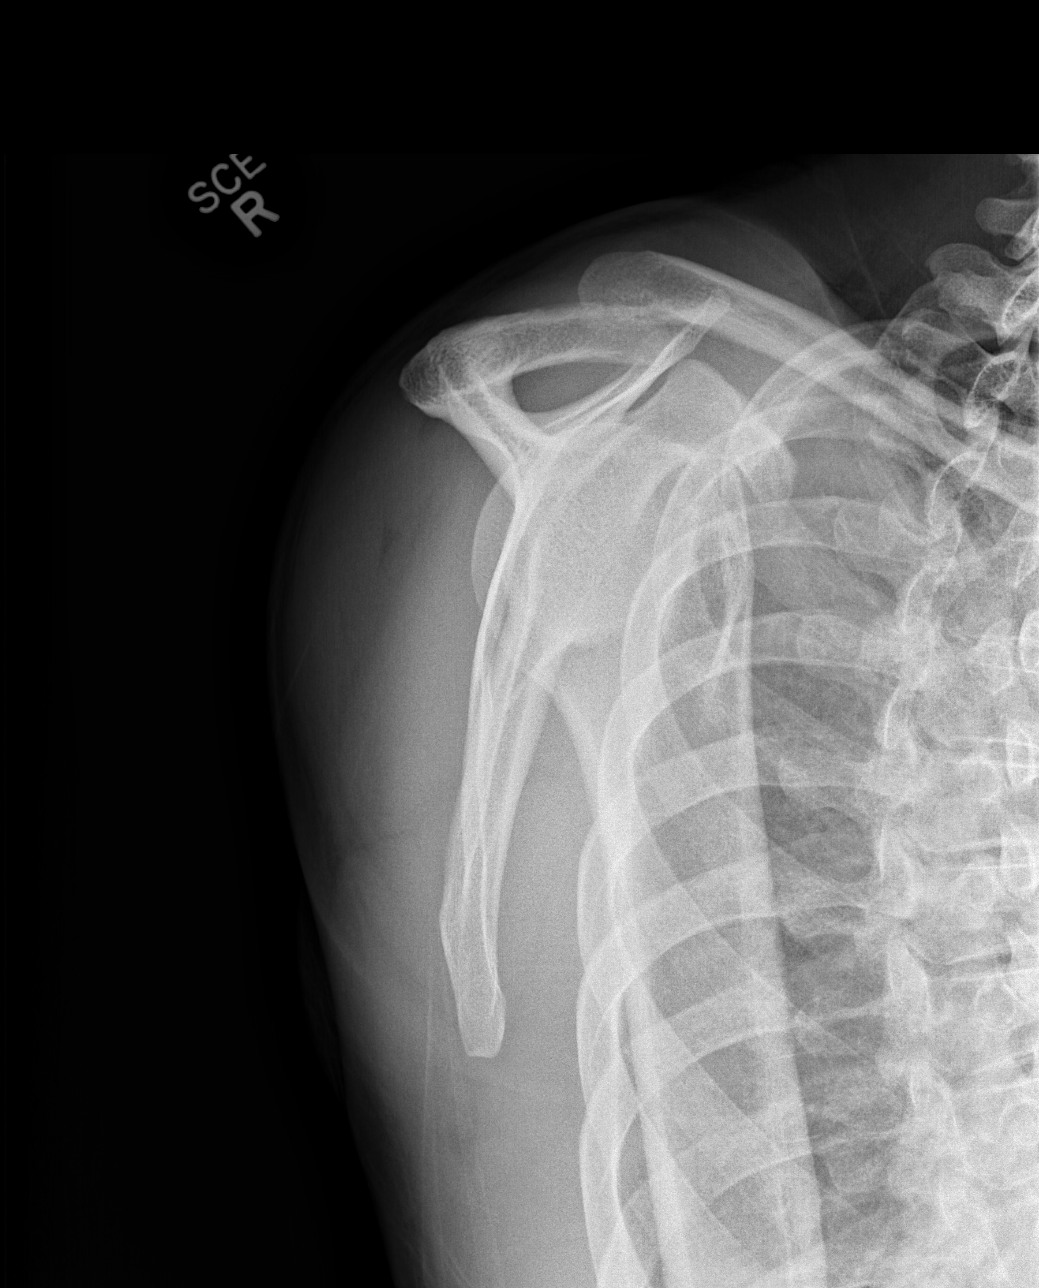
[im 3/3]
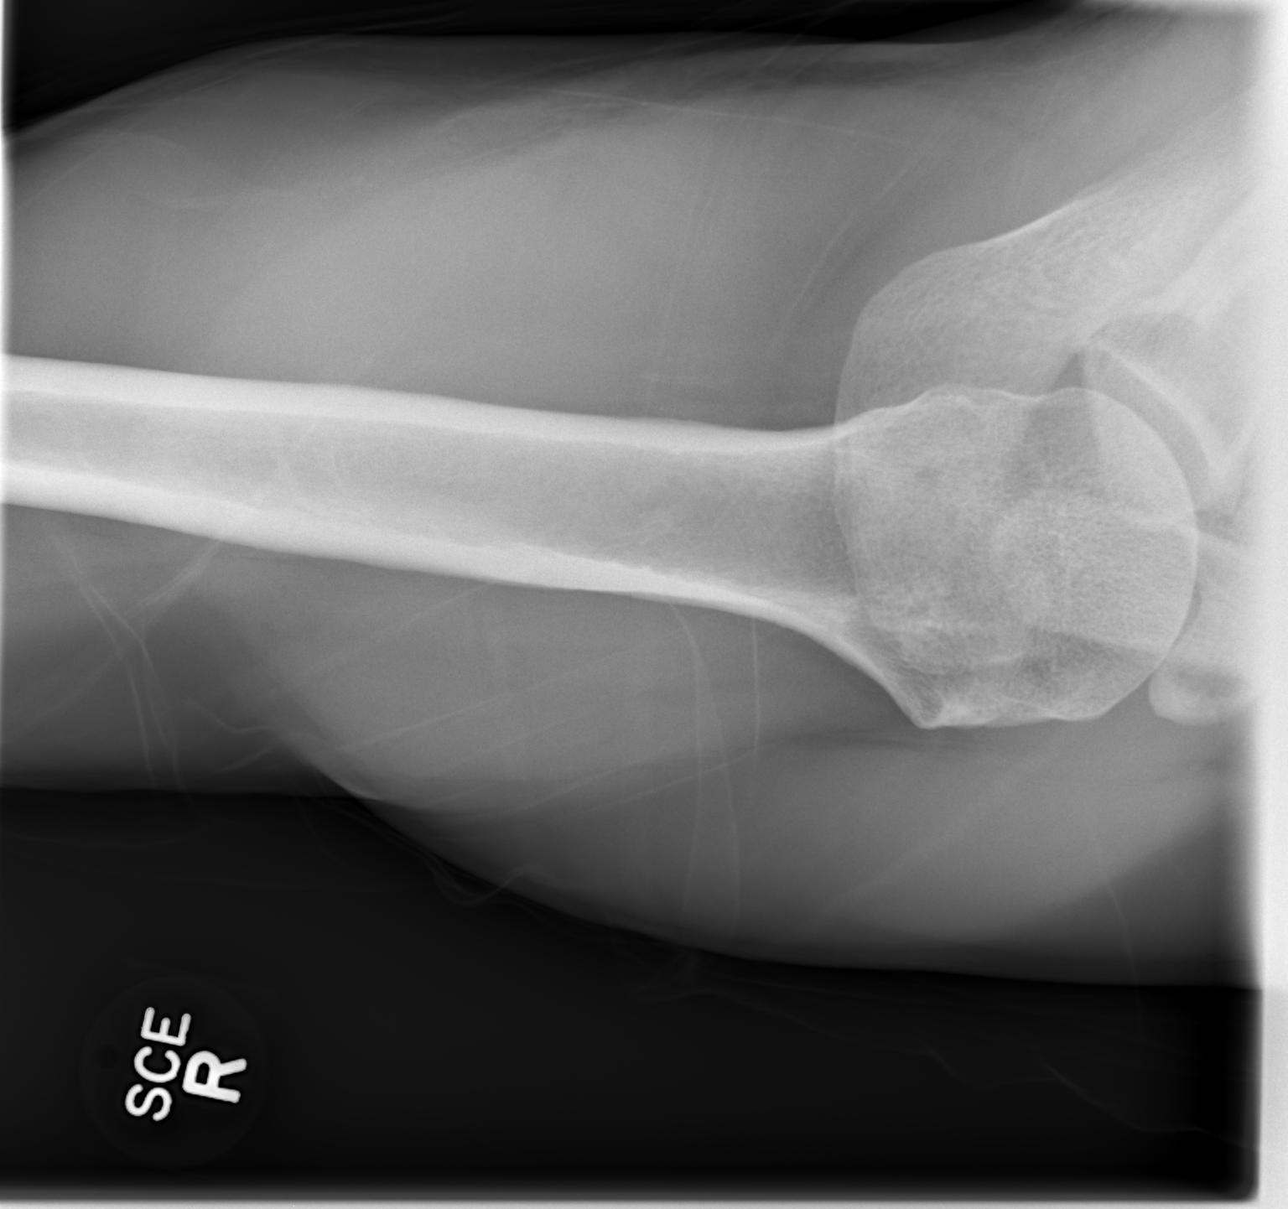

[3 of 3 positions shown; findings below may reference images not displayed]

FINDINGS: The right humeral head is in normal position with only mild
degenerative change of the right glenohumeral joint space. The right
AC joint is normally aligned. There is slight irregularity at the
insertion of the rotator cuff which may indicate chronic rotator
cuff disease. No soft tissue calcification is seen.
IMPRESSION: Mild degenerative joint disease of the right shoulder. Slight
irregularity at the insertion of the rotator cuff may indicate
chronic rotator cuff disease.

## 2018-03-24 ENCOUNTER — Ambulatory Visit (INDEPENDENT_AMBULATORY_CARE_PROVIDER_SITE_OTHER): Payer: PRIVATE HEALTH INSURANCE | Admitting: Family Medicine

## 2018-03-24 VITALS — BP 140/100 | HR 86 | Temp 98.1°F | Ht 67.0 in | Wt 151.2 lb

## 2018-03-24 DIAGNOSIS — M67432 Ganglion, left wrist: Secondary | ICD-10-CM | POA: Diagnosis not present

## 2018-03-24 DIAGNOSIS — I10 Essential (primary) hypertension: Secondary | ICD-10-CM | POA: Diagnosis not present

## 2018-03-24 DIAGNOSIS — Z114 Encounter for screening for human immunodeficiency virus [HIV]: Secondary | ICD-10-CM | POA: Diagnosis not present

## 2018-03-24 DIAGNOSIS — M7540 Impingement syndrome of unspecified shoulder: Secondary | ICD-10-CM

## 2018-03-24 DIAGNOSIS — Z720 Tobacco use: Secondary | ICD-10-CM | POA: Diagnosis not present

## 2018-03-24 DIAGNOSIS — Z23 Encounter for immunization: Secondary | ICD-10-CM | POA: Diagnosis not present

## 2018-03-24 DIAGNOSIS — M7520 Bicipital tendinitis, unspecified shoulder: Secondary | ICD-10-CM | POA: Insufficient documentation

## 2018-03-24 DIAGNOSIS — Z1211 Encounter for screening for malignant neoplasm of colon: Secondary | ICD-10-CM | POA: Diagnosis not present

## 2018-03-24 HISTORY — DX: Impingement syndrome of unspecified shoulder: M75.40

## 2018-03-24 HISTORY — DX: Bicipital tendinitis, unspecified shoulder: M75.20

## 2018-03-24 MED ORDER — LISINOPRIL 5 MG PO TABS: 5.0000 mg | ORAL_TABLET | Freq: Every day | ORAL | 1 refills | Status: DC

## 2018-03-24 NOTE — Progress Notes (Signed)
Patient: Nicholas Mccoy Male    DOB: 1966/09/24   52 y.o.   MRN: [id number] Visit Date: 03/24/2018   Today's Provider: Dortha Kernennis Chrismon, PA   Chief Complaint  Patient presents with  . Elevated Blood Pressure   Subjective:    HPI Patient presents today requesting a form be completed for employer due to HTN. He states he went for a pre-employment physical and his blood pressure was elevated. He reports BP reading was 156/101 sitting and 167/98 lying down. Patient denies dizziness and blurred vision. He states he does have episodes of headaches.     History reviewed. No pertinent past medical history. Patient Active Problem List   Diagnosis Date Noted  . Biceps tendinitis 03/24/2018  . Impingement syndrome of shoulder region 03/24/2018  . Current tobacco use 09/15/2015   Past Surgical History:  Procedure Laterality Date  . NO PAST SURGERIES     Family History  Problem Relation Age of Onset  . Hypertension Father   . Heart disease Father   . Heart attack Father   . Heart attack Sister   . Hypertension Brother   . Healthy Brother   . Sickle cell anemia Sister   . Hypertension Mother   . Hypertension Maternal Grandmother   . Hypertension Maternal Grandfather   . Hypertension Paternal Grandmother   . Hypertension Paternal Grandfather    No Known Allergies  No current outpatient medications on file.  Review of Systems  Constitutional: Negative.   Respiratory: Negative.   Cardiovascular: Negative.    Social History   Tobacco Use  . Smoking status: Current Every Day Smoker    Packs/day: 0.50    Years: 25.00    Pack years: 12.50    Types: Cigarettes  . Smokeless tobacco: Never Used  Substance Use Topics  . Alcohol use: Not Currently    Comment: stopped drinking 11/2017   Objective:   BP (!) 140/100 (BP Location: Right Arm, Patient Position: Sitting, Cuff Size: Normal)   Pulse 86   Temp 98.1 F (36.7 C) (Oral)   Ht 5\' 7"  (1.702 m)   Wt 151 lb 3.2 oz  (68.6 kg)   SpO2 98%   BMI 23.68 kg/m  BP Readings from Last 3 Encounters:  03/24/18 (!) 140/100  04/26/16 124/78  09/16/15 138/90   BP 140/98 RA supine, 142/100 RA sitting, 142/102 standing after 10 minute supine rest.  Physical Exam  Constitutional: He is oriented to person, place, and time. He appears well-developed and well-nourished. No distress.  HENT:  Head: Normocephalic and atraumatic.  Right Ear: Hearing and external ear normal.  Left Ear: Hearing and external ear normal.  Nose: Nose normal.  Many black teeth in very poor repair.  Eyes: Conjunctivae, EOM and lids are normal. Right eye exhibits no discharge. Left eye exhibits no discharge. No scleral icterus.  Bilateral white arcus senilis.  Neck: Normal range of motion. Neck supple. No JVD present. No thyromegaly present.  Cardiovascular: Normal rate, regular rhythm and normal heart sounds.  Pulmonary/Chest: Effort normal and breath sounds normal. No respiratory distress.  Abdominal: Soft. Bowel sounds are normal.  Musculoskeletal: Normal range of motion.  Soft ganglion cyst dorsum of the right wrist. Not tender and freely moveable.  Lymphadenopathy:    He has no cervical adenopathy.  Neurological: He is alert and oriented to person, place, and time.  Skin: Skin is warm, dry and intact. No lesion and no rash noted.  Psychiatric: He has a normal mood and  affect. His speech is normal and behavior is normal. Thought content normal.      Assessment & Plan:     1. 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 BP 140/98 RA supine, 142/100 RA sitting, 142/102 standing after 10 minute supine rest in the office today. Is a current smoker and rarely gets more than 2-3 caffeinated beverages in a day. Stopped all ETOH intake February 2019. Family history of hypertension in "nearly everybody". Will get routine screening labs and start Lisinopril 5 mg qd.  Recheck pending lab reports.  - CBC with Differential/Platelet - Comprehensive metabolic panel - Lipid panel - TSH - lisinopril (PRINIVIL,ZESTRIL) 5 MG tablet; Take 1 tablet (5 mg total) by mouth daily.  Dispense: 30 tablet; Refill: 1  2. Current tobacco use Has been smoking 1/2 ppd for the past 32 years. No chronic cough, dyspnea or wheezing. Recommend he discontinue and counseled about cessation programs. Wants to work on it himself by tapering off.  3. Ganglion cyst of dorsum of left wrist Asymptomatic soft ganglion cyst dorsum of the left wrist first noticed approximately 2 weeks ago. No known injury. Monitor for changes. May need aspiration.  4. Need for Tdap vaccination - Tdap vaccine greater than or equal to 7yo IM  5. Screening for HIV (human immunodeficiency virus) - HIV antibody  6. Colon cancer screening Asymptomatic. Get screening colonoscopy. - Ambulatory referral to Gastroenterology       Dortha Kern, PA  [facility]

## 2018-03-24 NOTE — Patient Instructions (Signed)

## 2018-03-25 LAB — LIPID PANEL
Chol/HDL Ratio: 3.4 ratio (ref 0.0–5.0)
Cholesterol, Total: 169 mg/dL (ref 100–199)
HDL: 50 mg/dL (ref >39)
LDL Calculated: 91 mg/dL (ref 0–99)
Triglycerides: 138 mg/dL (ref 0–149)
VLDL Cholesterol Cal: 28 mg/dL (ref 5–40)

## 2018-03-25 LAB — COMPREHENSIVE METABOLIC PANEL
ALBUMIN: 4.2 g/dL (ref 3.5–5.5)
ALT: 92 IU/L — AB (ref 0–44)
AST: 70 IU/L — ABNORMAL HIGH (ref 0–40)
Albumin/Globulin Ratio: 1.4 (ref 1.2–2.2)
Alkaline Phosphatase: 55 IU/L (ref 39–117)
BILIRUBIN TOTAL: 0.4 mg/dL (ref 0.0–1.2)
BUN / CREAT RATIO: 7 — AB (ref 9–20)
BUN: 5 mg/dL — ABNORMAL LOW (ref 6–24)
CALCIUM: 9.9 mg/dL (ref 8.7–10.2)
CHLORIDE: 103 mmol/L (ref 96–106)
CO2: 25 mmol/L (ref 20–29)
Creatinine, Ser: 0.71 mg/dL — ABNORMAL LOW (ref 0.76–1.27)
GFR, EST AFRICAN AMERICAN: 125 mL/min/{1.73_m2} (ref >59)
GFR, EST NON AFRICAN AMERICAN: 108 mL/min/{1.73_m2} (ref >59)
Globulin, Total: 3 g/dL (ref 1.5–4.5)
Glucose: 89 mg/dL (ref 65–99)
Potassium: 4 mmol/L (ref 3.5–5.2)
Sodium: 142 mmol/L (ref 134–144)
TOTAL PROTEIN: 7.2 g/dL (ref 6.0–8.5)

## 2018-03-25 LAB — CBC WITH DIFFERENTIAL/PLATELET
Basophils Absolute: 0 10*3/uL (ref 0.0–0.2)
Basos: 0 %
EOS (ABSOLUTE): 0.2 10*3/uL (ref 0.0–0.4)
Eos: 4 %
Hematocrit: 44 % (ref 37.5–51.0)
Hemoglobin: 14.7 g/dL (ref 13.0–17.7)
Immature Grans (Abs): 0 10*3/uL (ref 0.0–0.1)
Immature Granulocytes: 0 %
Lymphocytes Absolute: 2.8 10*3/uL (ref 0.7–3.1)
Lymphs: 57 %
MCH: 28.5 pg (ref 26.6–33.0)
MCHC: 33.4 g/dL (ref 31.5–35.7)
MCV: 85 fL (ref 79–97)
Monocytes Absolute: 0.4 10*3/uL (ref 0.1–0.9)
Monocytes: 9 %
Neutrophils Absolute: 1.5 10*3/uL (ref 1.4–7.0)
Neutrophils: 30 %
Platelets: 172 10*3/uL (ref 150–450)
RBC: 5.15 x10E6/uL (ref 4.14–5.80)
RDW: 14.1 % (ref 12.3–15.4)
WBC: 4.9 10*3/uL (ref 3.4–10.8)

## 2018-03-25 LAB — TSH: TSH: 1.42 u[IU]/mL (ref 0.450–4.500)

## 2018-03-25 LAB — HIV ANTIBODY (ROUTINE TESTING W REFLEX): HIV Screen 4th Generation wRfx: NONREACTIVE

## 2018-03-28 LAB — HEPATIC FUNCTION PANEL

## 2018-03-28 LAB — SPECIMEN STATUS REPORT

## 2018-03-29 LAB — HEPATITIS PANEL, ACUTE
HEP A IGM: NEGATIVE
HEP B C IGM: NEGATIVE
HEP B S AG: NEGATIVE
Hep C Virus Ab: >11 s/co ratio — ABNORMAL HIGH (ref 0.0–0.9)

## 2018-03-29 LAB — SPECIMEN STATUS REPORT

## 2018-03-30 ENCOUNTER — Telehealth: Payer: Self-pay

## 2018-03-30 DIAGNOSIS — R768 Other specified abnormal immunological findings in serum: Secondary | ICD-10-CM

## 2018-03-30 NOTE — Telephone Encounter (Signed)
LMTCB- lab is pending

## 2018-03-30 NOTE — Telephone Encounter (Signed)
-----   Message from Tamsen Roersennis E Chrismon, [place] sent at 03/30/2018  8:33 AM EDT ----- Hepatitis C antibody test is positive. Need to check HCV Nucleic Acid Amplification test. If positive, need referral to gastroenterology for treatment of active disease.

## 2018-03-30 NOTE — Telephone Encounter (Signed)
Patient advised. Lab slip printed at the front desk for pick up.  

## 2018-04-26 ENCOUNTER — Encounter: Payer: Self-pay | Admitting: *Deleted

## 2018-09-12 ENCOUNTER — Other Ambulatory Visit: Payer: Self-pay | Admitting: Family Medicine

## 2018-09-12 DIAGNOSIS — I10 Essential (primary) hypertension: Secondary | ICD-10-CM

## 2018-10-14 ENCOUNTER — Other Ambulatory Visit: Payer: Self-pay | Admitting: Family Medicine

## 2018-10-14 DIAGNOSIS — I10 Essential (primary) hypertension: Secondary | ICD-10-CM

## 2019-01-01 ENCOUNTER — Other Ambulatory Visit: Payer: Self-pay

## 2019-01-01 ENCOUNTER — Encounter: Payer: Self-pay | Admitting: Family Medicine

## 2019-01-01 ENCOUNTER — Telehealth: Payer: Self-pay | Admitting: Family Medicine

## 2019-01-01 ENCOUNTER — Ambulatory Visit: Payer: BLUE CROSS/BLUE SHIELD | Admitting: Family Medicine

## 2019-01-01 VITALS — BP 134/98 | HR 107 | Temp 98.4°F | Resp 16 | Wt 146.0 lb

## 2019-01-01 DIAGNOSIS — J320 Chronic maxillary sinusitis: Secondary | ICD-10-CM

## 2019-01-01 DIAGNOSIS — I10 Essential (primary) hypertension: Secondary | ICD-10-CM

## 2019-01-01 MED ORDER — AMOXICILLIN-POT CLAVULANATE 875-125 MG PO TABS: 1.0000 | ORAL_TABLET | Freq: Two times a day (BID) | ORAL | 0 refills | Status: DC

## 2019-01-01 MED ORDER — LISINOPRIL 10 MG PO TABS: 10.0000 mg | ORAL_TABLET | Freq: Every day | ORAL | 0 refills | Status: DC

## 2019-01-01 NOTE — Progress Notes (Signed)
Patient: Nicholas Mccoy Male    DOB: [birth year]   53 y.o.   MRN: [id number] Visit Date: 01/01/2019  Today's Provider: Dortha Kern, PA   Chief Complaint  Patient presents with  . Sinusitis   Subjective:     Sinusitis  This is a new problem. The current episode started in the past 7 days. The problem has been gradually worsening since onset. There has been no fever. The pain is mild. Associated symptoms include congestion, coughing, diaphoresis, headaches, sinus pressure and sneezing. Pertinent negatives include no chills, ear pain, hoarse voice, neck pain, shortness of breath, sore throat or swollen glands. Past treatments include nothing.   Patient has had sinus congestion and sinus pressure for 1 week. Patient also has symptoms of cough, sweats, headaches, and sneezing. Patient has not been taking any medications for his symptoms.   History reviewed. No pertinent past medical history. Patient Active Problem List   Diagnosis Date Noted  . Biceps tendinitis 03/24/2018  . Impingement syndrome of shoulder region 03/24/2018  . Current tobacco use 09/15/2015   Past Surgical History:  Procedure Laterality Date  . NO PAST SURGERIES     Family History  Problem Relation Age of Onset  . Hypertension Father   . Heart disease Father   . Heart attack Father   . Heart attack Sister   . Hypertension Brother   . Healthy Brother   . Sickle cell anemia Sister   . Hypertension Mother   . Hypertension Maternal Grandmother   . Hypertension Maternal Grandfather   . Hypertension Paternal Grandmother   . Hypertension Paternal Grandfather    No Known Allergies  Current Outpatient Medications:  .  lisinopril (PRINIVIL,ZESTRIL) 5 MG tablet, TAKE 1 TABLET BY MOUTH EVERY DAY, Disp: 90 tablet, Rfl: 0  Review of Systems  Constitutional: Positive for diaphoresis. Negative for appetite change, chills and fever.  HENT: Positive for congestion, sinus pressure and sneezing. Negative  for ear pain, hoarse voice and sore throat.   Respiratory: Positive for cough. Negative for chest tightness, shortness of breath and wheezing.   Cardiovascular: Negative for chest pain and palpitations.  Gastrointestinal: Negative for abdominal pain, nausea and vomiting.  Musculoskeletal: Negative for neck pain.  Neurological: Positive for headaches.   Social History   Tobacco Use  . Smoking status: Current Every Day Smoker    Packs/day: 0.50    Years: 25.00    Pack years: 12.50    Types: Cigarettes  . Smokeless tobacco: Never Used  Substance Use Topics  . Alcohol use: Not Currently    Comment: stopped drinking 11/2017     Objective:   BP (!) 140/100 (BP Location: Right Arm, Patient Position: Sitting, Cuff Size: Large)   Pulse (!) 107   Temp 98.4 F (36.9 C) (Oral)   Resp 16   Wt 146 lb (66.2 kg)   SpO2 96%   BMI 22.87 kg/m    BP Readings from Last 3 Encounters:  01/01/19 (!) 134/98  03/24/18 (!) 140/100  04/26/16 124/78    Vitals:   01/01/19 1450  BP: (!) 140/100  Pulse: (!) 107  Resp: 16  Temp: 98.4 F (36.9 C)  TempSrc: Oral  SpO2: 96%  Weight: 146 lb (66.2 kg)   Physical Exam Constitutional:      General: He is not in acute distress.    Appearance: He is well-developed.  HENT:     Head: Normocephalic and atraumatic.     Right Ear:  Hearing and tympanic membrane normal.     Left Ear: Hearing and tympanic membrane normal.     Nose: Nose normal.     Comments: Tender left maxillary sinus with no transillumination.    Mouth/Throat:     Pharynx: Oropharynx is clear.  Eyes:     General: Lids are normal. No scleral icterus.       Right eye: No discharge.        Left eye: No discharge.     Conjunctiva/sclera: Conjunctivae normal.  Neck:     Musculoskeletal: Neck supple.  Cardiovascular:     Rate and Rhythm: Regular rhythm. Tachycardia present.     Pulses: Normal pulses.     Heart sounds: Normal heart sounds.  Pulmonary:     Effort: Pulmonary effort is  normal. No respiratory distress.  Abdominal:     General: Bowel sounds are normal.     Palpations: Abdomen is soft.  Musculoskeletal: Normal range of motion.  Skin:    Findings: No lesion or rash.  Neurological:     Mental Status: He is alert and oriented to person, place, and time.  Psychiatric:        Speech: Speech normal.        Behavior: Behavior normal.        Thought Content: Thought content normal.       Assessment & Plan    1. Left maxillary sinusitis Onset with rhinitis from dust at work and increase in pollen counts over the past 2 weeks. Has progressed to some sweats and left maxillary sinus pressure pain. No cough, fever or shortness of breath. May use Mucinex-DM prn cough, Flonase Nasal Spray at bedtime and Claritin once a day. Add Augmentin and increase fluid intake. Follow up if no better in 7-10 days. Out of work 3 days. - amoxicillin-clavulanate (AUGMENTIN) 875-125 MG tablet; Take 1 tablet by mouth 2 (two) times daily.  Dispense: 20 tablet; Refill: 0  2. Essential hypertension BP still elevated with slight tachycardia. Will increase Lisinopril to 10 mg qd and asked him to recheck BP at home. Call report of readings in 7-10 days. - lisinopril (PRINIVIL,ZESTRIL) 10 MG tablet; Take 1 tablet (10 mg total) by mouth daily.  Dispense: 90 tablet; Refill: 0     Dortha Kern, PA  [facility]

## 2019-01-08 ENCOUNTER — Telehealth: Payer: Self-pay

## 2019-01-08 ENCOUNTER — Encounter: Payer: Self-pay | Admitting: *Deleted

## 2019-01-08 MED ORDER — ONDANSETRON 4 MG PO TBDP: 4.0000 mg | ORAL_TABLET | Freq: Three times a day (TID) | ORAL | 0 refills | Status: DC | PRN

## 2019-01-08 NOTE — Telephone Encounter (Signed)
Patient was advised. Rx sent to pharmacy.  

## 2019-01-08 NOTE — Telephone Encounter (Signed)
Patient was advised. Patient stated he would like to an antiemetic medication.

## 2019-01-08 NOTE — Telephone Encounter (Signed)
Please advise 

## 2019-01-08 NOTE — Telephone Encounter (Signed)
May have the note for Monday through Wednesday this week and should recheck Thursday if no better. Should have finished the antibiotic by that time. May need to be seen sooner if still vomiting tomorrow. Does he need an antiemetic medication?

## 2019-01-08 NOTE — Telephone Encounter (Signed)
Recommend Zofran-ODT 4 mg TID prn nausea and vomiting #12 (which ever pharmacy she prefers). GI upset may be due to Augmentin and need Activia or Probiotic supplement.

## 2019-01-08 NOTE — Telephone Encounter (Signed)
Patient reports nausea and vomiting daily. Patient was seen on 01/01/2019 and wants a note to stay out of work for a few more days.

## 2019-01-11 ENCOUNTER — Telehealth: Payer: Self-pay

## 2019-01-11 NOTE — Telephone Encounter (Signed)
Advised 

## 2019-01-11 NOTE — Telephone Encounter (Signed)
Advise patient a note was written to state he did not meet the screening criteria for a coronavirus infection. It may be picked up at the front desk.

## 2019-01-11 NOTE — Telephone Encounter (Signed)
Patient requesting a note stating that is did not have coronavirus and that he was treated for sinus infection. sd

## 2019-01-11 NOTE — Telephone Encounter (Signed)
Please review

## 2019-03-30 ENCOUNTER — Other Ambulatory Visit: Payer: Self-pay | Admitting: Family Medicine

## 2019-03-30 DIAGNOSIS — I10 Essential (primary) hypertension: Secondary | ICD-10-CM

## 2019-09-24 ENCOUNTER — Other Ambulatory Visit: Payer: Self-pay

## 2019-09-24 DIAGNOSIS — Z20822 Contact with and (suspected) exposure to covid-19: Secondary | ICD-10-CM

## 2019-09-25 ENCOUNTER — Other Ambulatory Visit: Payer: Self-pay | Admitting: Family Medicine

## 2019-09-25 DIAGNOSIS — I10 Essential (primary) hypertension: Secondary | ICD-10-CM

## 2019-09-25 LAB — NOVEL CORONAVIRUS, NAA: SARS-CoV-2, NAA: NOT DETECTED

## 2019-09-30 ENCOUNTER — Other Ambulatory Visit: Payer: Self-pay | Admitting: Family Medicine

## 2019-09-30 DIAGNOSIS — I10 Essential (primary) hypertension: Secondary | ICD-10-CM

## 2019-09-30 MED ORDER — LISINOPRIL 10 MG PO TABS: 10.0000 mg | ORAL_TABLET | Freq: Every day | ORAL | 0 refills | Status: DC

## 2020-01-18 ENCOUNTER — Ambulatory Visit: Payer: Self-pay | Attending: Internal Medicine

## 2020-01-18 DIAGNOSIS — Z23 Encounter for immunization: Secondary | ICD-10-CM

## 2020-01-18 NOTE — Progress Notes (Signed)
   Covid-19 Vaccination Clinic  Name:  Nicholas Mccoy    MRN: [id number] DOB: 07-21-1966  01/18/2020  Nicholas Mccoy was observed post Covid-19 immunization for 15 minutes without incident. He was provided with Vaccine Information Sheet and instruction to access the V-Safe system.   Nicholas Mccoy was instructed to call 911 with any severe reactions post vaccine: Marland Kitchen Difficulty breathing  . Swelling of face and throat  . A fast heartbeat  . A bad rash all over body  . Dizziness and weakness   Immunizations Administered    Name Date Dose VIS Date Route   Pfizer COVID-19 Vaccine 01/18/2020  9:32 AM 0.3 mL 09/21/2019 Intramuscular   Manufacturer: [employer], [employer]   Lot: [id number]   NDC: [address]-6

## 2020-02-13 ENCOUNTER — Ambulatory Visit: Payer: Self-pay | Attending: Internal Medicine

## 2021-10-13 ENCOUNTER — Emergency Department: Payer: Self-pay

## 2021-10-13 ENCOUNTER — Encounter: Payer: Self-pay | Admitting: *Deleted

## 2021-10-13 ENCOUNTER — Other Ambulatory Visit: Payer: Self-pay

## 2021-10-13 DIAGNOSIS — I10 Essential (primary) hypertension: Secondary | ICD-10-CM | POA: Insufficient documentation

## 2021-10-13 DIAGNOSIS — F17200 Nicotine dependence, unspecified, uncomplicated: Secondary | ICD-10-CM | POA: Insufficient documentation

## 2021-10-13 DIAGNOSIS — Z20822 Contact with and (suspected) exposure to covid-19: Secondary | ICD-10-CM | POA: Insufficient documentation

## 2021-10-13 DIAGNOSIS — J189 Pneumonia, unspecified organism: Secondary | ICD-10-CM | POA: Insufficient documentation

## 2021-10-13 LAB — RESP PANEL BY RT-PCR (FLU A&B, COVID) ARPGX2
Influenza A by PCR: NEGATIVE
Influenza B by PCR: NEGATIVE
SARS Coronavirus 2 by RT PCR: NEGATIVE

## 2021-10-13 NOTE — ED Triage Notes (Signed)
Pt  has sinus congestion and cough for 3 days.  Fever today  pt taking alka seltzer without relief.  Pt alert  speech clear.

## 2021-10-14 ENCOUNTER — Emergency Department
Admission: EM | Admit: 2021-10-14 | Discharge: 2021-10-14 | Disposition: A | Discharge status: Home / Self Care | Payer: Self-pay | Attending: Emergency Medicine | Admitting: Emergency Medicine

## 2021-10-14 DIAGNOSIS — J189 Pneumonia, unspecified organism: Secondary | ICD-10-CM

## 2021-10-14 LAB — CBC WITH DIFFERENTIAL/PLATELET
Abs Immature Granulocytes: 0.01 10*3/uL (ref 0.00–0.07)
Basophils Absolute: 0 10*3/uL (ref 0.0–0.1)
Basophils Relative: 0 %
Eosinophils Absolute: 0.2 10*3/uL (ref 0.0–0.5)
Eosinophils Relative: 3 %
HCT: 48.1 % (ref 39.0–52.0)
Hemoglobin: 16 g/dL (ref 13.0–17.0)
Immature Granulocytes: 0 %
Lymphocytes Relative: 31 %
Lymphs Abs: 1.6 10*3/uL (ref 0.7–4.0)
MCH: 30.2 pg (ref 26.0–34.0)
MCHC: 33.3 g/dL (ref 30.0–36.0)
MCV: 90.9 fL (ref 80.0–100.0)
Monocytes Absolute: 0.7 10*3/uL (ref 0.1–1.0)
Monocytes Relative: 13 %
Neutro Abs: 2.7 10*3/uL (ref 1.7–7.7)
Neutrophils Relative %: 53 %
Platelets: 116 10*3/uL — ABNORMAL LOW (ref 150–400)
RBC: 5.29 MIL/uL (ref 4.22–5.81)
RDW: 12.4 % (ref 11.5–15.5)
Smear Review: DECREASED
WBC: 5.1 10*3/uL (ref 4.0–10.5)
nRBC: 0 % (ref 0.0–0.2)

## 2021-10-14 LAB — BASIC METABOLIC PANEL
Anion gap: 6 (ref 5–15)
BUN: 10 mg/dL (ref 6–20)
CO2: 28 mmol/L (ref 22–32)
Calcium: 9.9 mg/dL (ref 8.9–10.3)
Chloride: 104 mmol/L (ref 98–111)
Creatinine, Ser: 0.88 mg/dL (ref 0.61–1.24)
GFR, Estimated: >60 mL/min (ref >60)
Glucose, Bld: 92 mg/dL (ref 70–99)
Potassium: 4.1 mmol/L (ref 3.5–5.1)
Sodium: 138 mmol/L (ref 135–145)

## 2021-10-14 LAB — PROCALCITONIN: Procalcitonin: <0.1 ng/mL

## 2021-10-14 LAB — LACTIC ACID, PLASMA: Lactic Acid, Venous: 1.2 mmol/L (ref 0.5–1.9)

## 2021-10-14 MED ORDER — AMOXICILLIN-POT CLAVULANATE 875-125 MG PO TABS: 1.0000 | ORAL_TABLET | Freq: Two times a day (BID) | ORAL | 0 refills | Status: AC

## 2021-10-14 MED ORDER — AMOXICILLIN-POT CLAVULANATE 875-125 MG PO TABS
1.0000 | ORAL_TABLET | Freq: Once | ORAL | Status: AC
  Administered 2021-10-14: 1 via ORAL

## 2021-10-14 MED ORDER — AZITHROMYCIN 500 MG PO TABS
ORAL_TABLET | ORAL | Status: AC
  Filled 2021-10-14: qty 1

## 2021-10-14 MED ORDER — AZITHROMYCIN 250 MG PO TABS: ORAL_TABLET | ORAL | 0 refills | Status: DC

## 2021-10-14 MED ORDER — ACETAMINOPHEN 500 MG PO TABS
1000.0000 mg | ORAL_TABLET | Freq: Once | ORAL | Status: AC
  Administered 2021-10-14: 1000 mg via ORAL
  Filled 2021-10-14: qty 2

## 2021-10-14 MED ORDER — AMLODIPINE BESYLATE 5 MG PO TABS: 5.0000 mg | ORAL_TABLET | Freq: Every day | ORAL | 1 refills | Status: DC

## 2021-10-14 MED ORDER — AMOXICILLIN-POT CLAVULANATE 875-125 MG PO TABS
ORAL_TABLET | ORAL | Status: AC
  Filled 2021-10-14: qty 1

## 2021-10-14 MED ORDER — AZITHROMYCIN 500 MG PO TABS
500.0000 mg | ORAL_TABLET | Freq: Once | ORAL | Status: AC
  Administered 2021-10-14: 500 mg via ORAL

## 2021-10-14 NOTE — ED Provider Notes (Signed)
[facility] Provider Note    Event Date/Time   First MD Initiated Contact with Patient 10/14/21 0518     (approximate)   History   Nasal Congestion   HPI  Nicholas Mccoy is a 56 y.o. male with a history of hypertension who presents for evaluation of cough and congestion.  Patient reports that his symptoms started 3 days ago and have been getting progressively worse.  Reports productive cough but denies fever, body aches, chills, chest pain, shortness of breath, vomiting, diarrhea.  No known sick contact exposures.  Patient is a smoker but denies history of COPD or asthma.  Has been taking Alka-Seltzer at home.    PMH HTN  Past Surgical History:  Procedure Laterality Date   NO PAST SURGERIES       Physical Exam   Triage Vital Signs: ED Triage Vitals  Enc Vitals Group     BP 10/13/21 2133 (!) 163/113     Pulse Rate 10/13/21 2133 (!) 112     Resp 10/13/21 2133 20     Temp 10/13/21 2133 100 F (37.8 C)     Temp Source 10/13/21 2133 Oral     SpO2 10/13/21 2133 99 %     Weight 10/13/21 2134 40 lb (18.1 kg)     Height 10/13/21 2134 5\' 6"  (1.676 m)     Head Circumference --      Peak Flow --      Pain Score 10/13/21 2133 8     Pain Loc --      Pain Edu? --      Excl. in GC? --     Most recent vital signs: Vitals:   10/13/21 2133 10/14/21 0521  BP: (!) 163/113 (!) 164/107  Pulse: (!) 112 86  Resp: 20 18  Temp: 100 F (37.8 C) 98.7 F (37.1 C)  SpO2: 99% 98%     Constitutional: Alert and oriented. Well appearing and in no apparent distress. HEENT:      Head: Normocephalic and atraumatic.         Eyes: Conjunctivae are normal. Sclera is non-icteric.       Mouth/Throat: Mucous membranes are moist.       Neck: Supple with no signs of meningismus. Cardiovascular: Regular rate and rhythm. No murmurs, gallops, or rubs. 2+ symmetrical distal pulses are present in all extremities.  Respiratory: Normal respiratory effort. Lungs are  clear to auscultation bilaterally.  Gastrointestinal: Soft, non tender, and non distended with positive bowel sounds. No rebound or guarding. Genitourinary: No CVA tenderness. Musculoskeletal:  No edema, cyanosis, or erythema of extremities. Neurologic: Normal speech and language. Face is symmetric. Moving all extremities. No gross focal neurologic deficits are appreciated. Skin: Skin is warm, dry and intact. No rash noted. Psychiatric: Mood and affect are normal. Speech and behavior are normal.  ED Results / Procedures / Treatments   Labs (all labs ordered are listed, but only abnormal results are displayed) Labs Reviewed  CBC WITH DIFFERENTIAL/PLATELET - Abnormal; Notable for the following components:      Result Value   Platelets 116 (*)    All other components within normal limits  RESP PANEL BY RT-PCR (FLU A&B, COVID) ARPGX2  BASIC METABOLIC PANEL  LACTIC ACID, PLASMA  PROCALCITONIN     EKG  none   RADIOLOGY I have personally reviewed the images performed during this visit and I agree with the Radiologist's read.   Interpretation by Radiologist:  DG Chest 2 View  Result Date: 10/13/2021 CLINICAL DATA:  Cough.  Fever. EXAM: CHEST - 2 VIEW COMPARISON:  Remote radiograph 11/29/2011 FINDINGS: Patchy perifissural opacity in the right upper lobe. Mild patchy opacity at the left lung base. Heart is in size with normal mediastinal contours. No pulmonary edema, pleural effusion, or pneumothorax. No acute osseous abnormalities are seen. IMPRESSION: Patchy right upper lobe and left lower lobe opacities, suspicious for pneumonia in the setting of cough and fever. Electronically Signed   By: Narda RutherfordMelanie  Sanford M.D.   On: 10/13/2021 21:58      PROCEDURES:  Critical Care performed: No  Procedures    IMPRESSION / MDM / ASSESSMENT AND PLAN / ED COURSE  I reviewed the triage vital signs and the nursing notes.  56 y.o. male with a history of hypertension who presents for evaluation  of cough and congestion x 3 days.  Patient initially had a low-grade fever and mild tachycardia and that resolved after Tylenol.  Patient has normal work of breathing and normal sats, otherwise extremely well-appearing.  Patient was ambulated by me and remained with sats at 100% and normal work of breathing.  Lungs are clear to auscultation.  Ddx: Viral syndrome versus sinusitis versus COVID versus flu versus pneumonia   Plan: Chest x-ray, CBC, BMP, procalcitonin, lactic acid, COVID and flu swab.   MEDICATIONS GIVEN IN ED: Medications  azithromycin (ZITHROMAX) 500 MG tablet (has no administration in time range)  amoxicillin-clavulanate (AUGMENTIN) 875-125 MG per tablet (has no administration in time range)  acetaminophen (TYLENOL) tablet 1,000 mg (1,000 mg Oral Given 10/14/21 0132)  amoxicillin-clavulanate (AUGMENTIN) 875-125 MG per tablet 1 tablet (1 tablet Oral Given 10/14/21 0536)  azithromycin (ZITHROMAX) tablet 500 mg (500 mg Oral Given 10/14/21 0536)     ED COURSE: Chest x-ray visualized by me consistent with pneumonia, confirmed by radiology.  COVID and flu negative.  No signs of sepsis with normal white count, negative procalcitonin and lactic acidosis.  Tachycardia resolved after patient defervesced.  No hypoxia, no tachypnea, no tachycardia, normal work of breathing and normal sats both at rest and with ambulation.  With no signs of sepsis, normal work of breathing, normal sats patient does not require admission to the hospital at this time.  Discussed my standard return precautions for chest pain or shortness of breath.  Patient was started on Augmentin and azithromycin.  Recommended close follow-up with his primary care doctor.  Patient has a history of hypertension has been out of his medications for a very long time.  Prescription for amlodipine was provided.   Consults: None   EMR reviewed review patient's last visit with his primary care doctor from March 2020 where he was seen for  his high blood pressure         FINAL CLINICAL IMPRESSION(S) / ED DIAGNOSES   Final diagnoses:  Community acquired pneumonia, unspecified laterality     Rx / DC Orders   ED Discharge Orders          Ordered    amoxicillin-clavulanate (AUGMENTIN) 875-125 MG tablet  2 times daily        10/14/21 0523    azithromycin (ZITHROMAX) 250 MG tablet        10/14/21 0523    amLODipine (NORVASC) 5 MG tablet  Daily        10/14/21 0523             Note:  This document was prepared using Dragon voice recognition software and may include unintentional dictation errors.  Don Perking, [place], MD 10/14/21 0630

## 2021-10-14 NOTE — ED Notes (Signed)
Patient discharged to home per MD order. Patient in stable condition, and deemed medically cleared by ED provider for discharge. Discharge instructions reviewed with patient/family using "Teach Back"; verbalized understanding of medication education and administration, and information about follow-up care. Denies further concerns. ° °

## 2021-10-14 NOTE — Discharge Instructions (Signed)

## 2023-04-01 ENCOUNTER — Encounter: Payer: Self-pay | Admitting: Family Medicine

## 2023-04-01 ENCOUNTER — Ambulatory Visit (INDEPENDENT_AMBULATORY_CARE_PROVIDER_SITE_OTHER): Payer: 59 | Admitting: Family Medicine

## 2023-04-01 VITALS — BP 138/100 | HR 78 | Temp 98.3°F | Ht 66.0 in | Wt 144.0 lb

## 2023-04-01 DIAGNOSIS — Z0000 Encounter for general adult medical examination without abnormal findings: Secondary | ICD-10-CM

## 2023-04-01 DIAGNOSIS — F1010 Alcohol abuse, uncomplicated: Secondary | ICD-10-CM | POA: Diagnosis not present

## 2023-04-01 DIAGNOSIS — K921 Melena: Secondary | ICD-10-CM

## 2023-04-01 DIAGNOSIS — Z716 Tobacco abuse counseling: Secondary | ICD-10-CM

## 2023-04-01 DIAGNOSIS — R768 Other specified abnormal immunological findings in serum: Secondary | ICD-10-CM | POA: Diagnosis not present

## 2023-04-01 DIAGNOSIS — I10 Essential (primary) hypertension: Secondary | ICD-10-CM

## 2023-04-01 DIAGNOSIS — D696 Thrombocytopenia, unspecified: Secondary | ICD-10-CM | POA: Diagnosis not present

## 2023-04-01 DIAGNOSIS — Z136 Encounter for screening for cardiovascular disorders: Secondary | ICD-10-CM | POA: Diagnosis not present

## 2023-04-01 DIAGNOSIS — Z7689 Persons encountering health services in other specified circumstances: Secondary | ICD-10-CM

## 2023-04-01 DIAGNOSIS — H18413 Arcus senilis, bilateral: Secondary | ICD-10-CM | POA: Diagnosis not present

## 2023-04-01 DIAGNOSIS — Z1211 Encounter for screening for malignant neoplasm of colon: Secondary | ICD-10-CM

## 2023-04-01 LAB — BASIC METABOLIC PANEL
Potassium: 4.6 mEq/L (ref 3.5–5.1)
Sodium: 141 (ref 137–147)

## 2023-04-01 MED ORDER — NICOTINE 14 MG/24HR TD PT24: 14.0000 mg | MEDICATED_PATCH | Freq: Every day | TRANSDERMAL | 0 refills | Status: DC

## 2023-04-01 MED ORDER — AMLODIPINE BESYLATE 5 MG PO TABS: 5.0000 mg | ORAL_TABLET | Freq: Every day | ORAL | 1 refills | Status: DC

## 2023-04-01 NOTE — Progress Notes (Signed)
New patient visit   Patient: Nicholas Mccoy   DOB: [birth year]   57 y.o. Male  MRN: [id number] Visit Date: 04/01/2023  Today's healthcare provider: Sherlyn Hay, DO   Chief Complaint  Patient presents with   Annual Exam    Pt states he has been having blood in his stool for about 5 months intermittently. Pt had blood in stool this morning and may go a week or two without seeing blood in stool.   Subjective    Nicholas Mccoy is a 57 y.o. male who presents today as a new patient to establish care.  HPI HPI     Annual Exam    Additional comments: Pt states he has been having blood in his stool for about 5 months intermittently. Pt had blood in stool this morning and may go a week or two without seeing blood in stool.      Last edited by Daneen Schick, CMA on 04/01/2023 10:33 AM.      Patient states he last drinks insurance for some time and that is why he has not been back into the doctor.  He endorses that he has had blood occasionally in his stool and sometimes while wiping over the past 5 months; these instances are occasionally separated by several weeks.  Alcohol abuse 3-4x per day drinking 1/2 ppd smoking since 57 years old when he started smoking; stopped for three years then restarted  No constipation/diarrhea. Has bowel movement every day. No history of hemorrhoids to his knowledge. No colonoscopy previously  In 2020 - quit drinking for 1.5-2 years after having pains in his liver region and being advised to stop. - Loves sweet tea - Doesn't like sodas   History reviewed. No pertinent past medical history. Past Surgical History:  Procedure Laterality Date   NO PAST SURGERIES     Family Status  Relation Name Status   Father  Deceased       2014-03-10   Sister 1 Deceased at age 37   Brother 1 Alive   Brother 2 Alive   Sister 2 Alive   Mother  Alive   Daughter  Alive   Son  Alive   MGM  Deceased   MGF  Deceased   PGM  Deceased   PGF   Deceased   Daughter  Alive   Family History  Problem Relation Age of Onset   Hypertension Father    Heart disease Father    Heart attack Father    Heart attack Sister    Hypertension Brother    Healthy Brother    Sickle cell anemia Sister    Hypertension Mother    Hypertension Maternal Grandmother    Hypertension Maternal Grandfather    Hypertension Paternal Grandmother    Hypertension Paternal Grandfather    Social History   Socioeconomic History   Marital status: Divorced    Spouse name: Not on file   Number of children: Not on file   Years of education: Not on file   Highest education level: Not on file  Occupational History   Not on file  Tobacco Use   Smoking status: Every Day    Packs/day: 0.50    Years: 25.00    Additional pack years: 0.00    Total pack years: 12.50    Types: Cigarettes   Smokeless tobacco: Never  Substance and Sexual Activity   Alcohol use: Yes    Comment: stopped drinking 11/2017   Drug use: No  Sexual activity: Yes  Other Topics Concern   Not on file  Social History Narrative   Not on file   Social Determinants of Health   Financial Resource Strain: Not on file  Food Insecurity: Not on file  Transportation Needs: Not on file  Physical Activity: Not on file  Stress: Not on file  Social Connections: Not on file   Outpatient Medications Prior to Visit  Medication Sig Note   [DISCONTINUED] amLODipine (NORVASC) 5 MG tablet Take 1 tablet (5 mg total) by mouth daily. 04/01/2023: Pt states he stopped taking in 2022 because he felt better   [DISCONTINUED] azithromycin (ZITHROMAX) 250 MG tablet Take 1 a day for 4 days (Patient not taking: Reported on 04/01/2023)    [DISCONTINUED] ondansetron (ZOFRAN ODT) 4 MG disintegrating tablet Take 1 tablet (4 mg total) by mouth 3 (three) times daily as needed for nausea or vomiting. (Patient not taking: Reported on 04/01/2023)    No facility-administered medications prior to visit.   No Known  Allergies  Immunization History  Administered Date(s) Administered   PFIZER(Purple Top)SARS-COV-2 Vaccination 01/18/2020   Tdap 03/24/2018    Health Maintenance  Topic Date Due   Colonoscopy  Never done   Zoster Vaccines- Shingrix (1 of 2) Never done   COVID-19 Vaccine (2 - 2023-24 season) 06/11/2022   INFLUENZA VACCINE  05/12/2023   DTaP/Tdap/Td (2 - Td or Tdap) 03/24/2028   Hepatitis C Screening  Completed   HIV Screening  Completed   HPV VACCINES  Aged Out    Patient Care Team: Decie Verne, Monico Blitz, DO as PCP - General (Family Medicine)  Review of Systems  Gastrointestinal:  Positive for blood in stool.  Allergic/Immunologic: Positive for environmental allergies.  All other systems reviewed and are negative.      Objective    BP (!) 138/100 (BP Location: Right Arm, Patient Position: Sitting, Cuff Size: Normal)   Pulse 78   Temp 98.3 F (36.8 C) (Oral)   Ht 5\' 6"  (1.676 m)   Wt 144 lb (65.3 kg)   BMI 23.24 kg/m    Physical Exam Vitals reviewed.  Constitutional:      General: He is not in acute distress.    Appearance: Normal appearance. He is well-developed. He is not diaphoretic.  HENT:     Head: Normocephalic and atraumatic.     Right Ear: Tympanic membrane, ear canal and external ear normal.     Left Ear: Tympanic membrane, ear canal and external ear normal.     Nose: Nose normal.     Mouth/Throat:     Mouth: Mucous membranes are moist.     Pharynx: Oropharynx is clear. No oropharyngeal exudate.  Eyes:     General: Lids are normal. Vision grossly intact. Gaze aligned appropriately. Scleral icterus (bilateral) present.     Conjunctiva/sclera:     Right eye: Right conjunctiva is injected.     Left eye: Left conjunctiva is injected.     Pupils: Pupils are equal, round, and reactive to light.      Comments: Bilateral bluish-grey rings around iris  Neck:     Thyroid: No thyromegaly.  Cardiovascular:     Rate and Rhythm: Normal rate and regular rhythm.      Pulses: Normal pulses.     Heart sounds: Normal heart sounds. No murmur heard. Pulmonary:     Effort: Pulmonary effort is normal. No respiratory distress.     Breath sounds: Normal breath sounds. No wheezing or rales.  Abdominal:  General: There is no distension.     Palpations: Abdomen is soft.     Tenderness: There is no abdominal tenderness.  Musculoskeletal:        General: No deformity.     Cervical back: Neck supple.     Right lower leg: No edema.     Left lower leg: No edema.  Lymphadenopathy:     Cervical: No cervical adenopathy.  Skin:    General: Skin is warm and dry.     Findings: No rash.  Neurological:     Mental Status: He is alert and oriented to person, place, and time. Mental status is at baseline.     Gait: Gait normal.  Psychiatric:        Mood and Affect: Mood normal.        Behavior: Behavior normal.        Thought Content: Thought content normal.     Depression Screen    04/01/2023   10:35 AM 03/24/2018    2:21 PM  PHQ 2/9 Scores  PHQ - 2 Score 0 0  PHQ- 9 Score 0    No results found for any visits on 04/01/23.  Assessment & Plan     1. Establishing care with new doctor, encounter for  2. Annual physical exam Physical exam done today with abnormalities as noted.  Will check routine blood work as noted below. - Comprehensive metabolic panel - Lipid panel  3. Screening for AAA (abdominal aortic aneurysm) 16-pack-year smoking history.  Patient has not had an presented to screen for an abdominal Reddick aneurysm that he knows of or that I see in his chart.  Will go ahead and order one as noted below - VAS [place] AAA DUPLEX; Future  4. Essential hypertension Patient's blood pressure is mildly elevated today.  However, he did not realize the ER started him on a regular blood pressure medication and finished the medications he was given last year without any follow-up.  Re-prescribed amlodipine as noted below.  Will also check a urine microalbumin  ratio in addition to the routine lab work noted elsewhere. - Microalbumin / creatinine urine ratio - amLODipine (NORVASC) 5 MG tablet; Take 1 tablet (5 mg total) by mouth daily.  Dispense: 30 tablet; Refill: 1  5. Thrombocytopenia (HCC) Patient has history of thrombocytopenia in the 110s.  Will recheck today. - CBC  6. Hepatitis C antibody positive in blood Going through records, I noted that the patient had a positive hepatitis C screening in 2019.  I see a recommendation/order 03/30/2018 for a follow-up HCV nucleic acid amplification test with plan for referral to GI if positive.  However, I do not see any evidence that the patient ever followed up to have this done.  It appears that this coincided with the time he was advised to stop drinking in relation to his right upper quadrant pain. - HCV Ab w Reflex to Quant PCR  7. Arcus senilis of cornea, bilateral Noted during physical exam.  Checking lipid panel today.  8. Encounter for smoking cessation counseling Patient initially was uninterested in smoking cessation.  However, while being counseled regarding availability of medications should he change his mind, he opted to trial a nicotine patch, which is prescribed below. - nicotine (NICODERM CQ - DOSED IN MG/24 HOURS) 14 mg/24hr patch; Place 1 patch (14 mg total) onto the skin daily.  Dispense: 28 patch; Refill: 0  9. Alcohol abuse, daily use Spent approximately 10 to 15 minutes counseling patient on  the risks of continued alcohol abuse.  Strongly encouraged him to cut back to, at most, one drink daily, with an occasional second drink approximately 2 times per week.  I advised him that, if he was willing to cut back to no alcohol at all, I definitely encourage that.  I discussed with him alternative drinks he could have instead as noted in HPI.  10. Hematochezia Patient has had occasional hematochezia over the past 5 months with no notable hemorrhoids.  Will refer to GI as noted. -  Ambulatory referral to Gastroenterology  11. Colon cancer screening Patient is overdue for colon cancer screening and he is amenable to referral.  Referral sent as noted below. - Ambulatory referral to Gastroenterology    Return in about 4 weeks (around 04/29/2023) for EtOH/BP.     The entirety of the information documented in the History of Present Illness, Review of Systems and Physical Exam were personally obtained by me. Portions of this information were initially documented by the CMA, Hyacinth Meeker, and reviewed by me for thoroughness and accuracy.   I discussed the assessment and treatment plan with the patient  The patient was provided an opportunity to ask questions and all were answered. The patient agreed with the plan and demonstrated an understanding of the instructions.   The patient was advised to call back or seek an in-person evaluation if the symptoms worsen or if the condition fails to improve as anticipated.    Sherlyn Hay, DO  [facility] [phone] (phone) [phone] (fax)  [facility]

## 2023-04-03 LAB — CBC
Hematocrit: 51.1 % — ABNORMAL HIGH (ref 37.5–51.0)
Hemoglobin: 16.7 g/dL (ref 13.0–17.7)
MCH: 29 pg (ref 26.6–33.0)
MCHC: 32.7 g/dL (ref 31.5–35.7)
MCV: 89 fL (ref 79–97)
Platelets: 164 10*3/uL (ref 150–450)
RBC: 5.76 x10E6/uL (ref 4.14–5.80)
RDW: 12.9 % (ref 11.6–15.4)
WBC: 4.3 10*3/uL (ref 3.4–10.8)

## 2023-04-03 LAB — HCV RT-PCR, QUANT (NON-GRAPH)
HCV log10: 5.613 log10 IU/mL
Hepatitis C Quantitation: 410000 IU/mL

## 2023-04-03 LAB — COMPREHENSIVE METABOLIC PANEL
ALT: 104 IU/L — ABNORMAL HIGH (ref 0–44)
AST: 122 IU/L — ABNORMAL HIGH (ref 0–40)
Albumin: 4.3 g/dL (ref 3.8–4.9)
Alkaline Phosphatase: 103 IU/L (ref 44–121)
BUN/Creatinine Ratio: 8 — ABNORMAL LOW (ref 9–20)
BUN: 7 mg/dL (ref 6–24)
Bilirubin Total: 0.5 mg/dL (ref 0.0–1.2)
CO2: 22 mmol/L (ref 20–29)
Calcium: 10.3 mg/dL — ABNORMAL HIGH (ref 8.7–10.2)
Chloride: 105 mmol/L (ref 96–106)
Creatinine, Ser: 0.83 mg/dL (ref 0.76–1.27)
Globulin, Total: 4.1 g/dL (ref 1.5–4.5)
Glucose: 94 mg/dL (ref 70–99)
Potassium: 4.6 mmol/L (ref 3.5–5.2)
Sodium: 141 mmol/L (ref 134–144)
Total Protein: 8.4 g/dL (ref 6.0–8.5)
eGFR: 102 mL/min/{1.73_m2} (ref >59)

## 2023-04-03 LAB — LIPID PANEL
Chol/HDL Ratio: 3.3 ratio (ref 0.0–5.0)
Cholesterol, Total: 182 mg/dL (ref 100–199)
HDL: 55 mg/dL (ref >39)
LDL Chol Calc (NIH): 112 mg/dL — ABNORMAL HIGH (ref 0–99)
Triglycerides: 79 mg/dL (ref 0–149)
VLDL Cholesterol Cal: 15 mg/dL (ref 5–40)

## 2023-04-03 LAB — MICROALBUMIN / CREATININE URINE RATIO
Creatinine, Urine: 124.2 mg/dL
Microalb/Creat Ratio: 31 mg/g creat — ABNORMAL HIGH (ref 0–29)
Microalbumin, Urine: 38.9 ug/mL

## 2023-04-03 LAB — HCV AB W REFLEX TO QUANT PCR: HCV Ab: REACTIVE — AB

## 2023-04-05 ENCOUNTER — Other Ambulatory Visit: Payer: Self-pay | Admitting: Family Medicine

## 2023-04-05 DIAGNOSIS — F1010 Alcohol abuse, uncomplicated: Secondary | ICD-10-CM

## 2023-04-05 DIAGNOSIS — Z136 Encounter for screening for cardiovascular disorders: Secondary | ICD-10-CM

## 2023-04-05 DIAGNOSIS — Z1211 Encounter for screening for malignant neoplasm of colon: Secondary | ICD-10-CM

## 2023-04-05 DIAGNOSIS — Z87891 Personal history of nicotine dependence: Secondary | ICD-10-CM

## 2023-04-05 DIAGNOSIS — Z716 Tobacco abuse counseling: Secondary | ICD-10-CM

## 2023-04-05 DIAGNOSIS — H18413 Arcus senilis, bilateral: Secondary | ICD-10-CM

## 2023-04-05 DIAGNOSIS — Z0000 Encounter for general adult medical examination without abnormal findings: Secondary | ICD-10-CM

## 2023-04-05 DIAGNOSIS — R768 Other specified abnormal immunological findings in serum: Secondary | ICD-10-CM

## 2023-04-05 DIAGNOSIS — Z7689 Persons encountering health services in other specified circumstances: Secondary | ICD-10-CM

## 2023-04-05 DIAGNOSIS — K921 Melena: Secondary | ICD-10-CM

## 2023-04-05 DIAGNOSIS — D696 Thrombocytopenia, unspecified: Secondary | ICD-10-CM

## 2023-04-05 DIAGNOSIS — I10 Essential (primary) hypertension: Secondary | ICD-10-CM

## 2023-04-06 ENCOUNTER — Other Ambulatory Visit: Payer: Self-pay | Admitting: Family Medicine

## 2023-04-06 ENCOUNTER — Encounter: Payer: Self-pay | Admitting: Family Medicine

## 2023-04-06 ENCOUNTER — Ambulatory Visit (INDEPENDENT_AMBULATORY_CARE_PROVIDER_SITE_OTHER): Payer: 59

## 2023-04-06 DIAGNOSIS — E785 Hyperlipidemia, unspecified: Secondary | ICD-10-CM | POA: Insufficient documentation

## 2023-04-06 DIAGNOSIS — Z136 Encounter for screening for cardiovascular disorders: Secondary | ICD-10-CM | POA: Diagnosis not present

## 2023-04-06 DIAGNOSIS — Z87891 Personal history of nicotine dependence: Secondary | ICD-10-CM | POA: Diagnosis not present

## 2023-04-06 MED ORDER — ROSUVASTATIN CALCIUM 20 MG PO TABS
20.0000 mg | ORAL_TABLET | Freq: Every day | ORAL | 3 refills | Status: DC
Start: 2023-04-06 — End: 2023-05-11

## 2023-04-21 LAB — IRON,TIBC AND FERRITIN PANEL
%SAT: 41
Iron: 106
TIBC: 258

## 2023-04-21 LAB — HEPATIC FUNCTION PANEL
ALT: 17 U/L (ref 10–40)
AST: 20 (ref 14–40)
Bilirubin, Total: 0.3

## 2023-04-21 LAB — CBC AND DIFFERENTIAL
Hemoglobin: 11.4 — AB (ref 13.5–17.5)
WBC: 5.2

## 2023-04-21 LAB — BASIC METABOLIC PANEL
BUN: 37 — AB (ref 4–21)
Chloride: 106 (ref 99–108)
Glucose: 98

## 2023-04-21 LAB — CBC: RBC: 3.73 — AB (ref 3.87–5.11)

## 2023-04-23 ENCOUNTER — Other Ambulatory Visit: Payer: Self-pay | Admitting: Family Medicine

## 2023-04-23 DIAGNOSIS — I10 Essential (primary) hypertension: Secondary | ICD-10-CM

## 2023-04-28 ENCOUNTER — Ambulatory Visit (INDEPENDENT_AMBULATORY_CARE_PROVIDER_SITE_OTHER): Payer: 59 | Admitting: Family Medicine

## 2023-04-28 ENCOUNTER — Encounter: Payer: Self-pay | Admitting: Family Medicine

## 2023-04-28 VITALS — BP 122/88 | HR 98 | Temp 97.7°F | Resp 12 | Ht 66.0 in | Wt 147.2 lb

## 2023-04-28 DIAGNOSIS — R252 Cramp and spasm: Secondary | ICD-10-CM | POA: Diagnosis not present

## 2023-04-28 DIAGNOSIS — R748 Abnormal levels of other serum enzymes: Secondary | ICD-10-CM | POA: Diagnosis not present

## 2023-04-28 DIAGNOSIS — F1010 Alcohol abuse, uncomplicated: Secondary | ICD-10-CM

## 2023-04-28 DIAGNOSIS — I10 Essential (primary) hypertension: Secondary | ICD-10-CM

## 2023-04-28 NOTE — Progress Notes (Unsigned)
Established patient visit   Patient: Nicholas Mccoy   DOB: 09/29/1966   57 y.o. Male  MRN: [id number] Visit Date: 04/28/2023  Today's healthcare provider: Sherlyn Hay, DO   Chief Complaint  Patient presents with   Medical Management of Chronic Issues    04/01/23 patient was restarted on amlodipine 5 mg daily for hypertension. Patient reports good compliance and tolerance of medication. Patient is not checking BP at home. He denies any chest pain, shortness of breath or swelling around feet or ankles.  Patient reports he was not able to tolerate patches. He reports they made him feel nauseated.    Subjective    HPI HPI     Medical Management of Chronic Issues    Additional comments: 04/01/23 patient was restarted on amlodipine 5 mg daily for hypertension. Patient reports good compliance and tolerance of medication. Patient is not checking BP at home. He denies any chest pain, shortness of breath or swelling around feet or ankles.  Patient reports he was not able to tolerate patches. He reports they made him feel nauseated.         Comments   Cramps: patient reports cramps in hands and toes x 2 weeks. Cramps come and go.       Last edited by Myles Lipps, CMA on 04/28/2023  9:25 AM.       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  - has been ongoing for approx. two weeks Started on rosuvastatin from previous lab results  Tobacco: Down to under a half pack a day (~6 cigarettes per day)  Alcohol: Some days doesn't dirnk at all (M-Th); other days (weekend), drinks two beers (or occasional shot of liquor) Drinking sweet tea and V8 tomato puree (up to two per day for breakfast with his meds, then will eat breakfast; chicken biscuit)   Medications: Outpatient Medications Prior to Visit  Medication  Sig   amLODipine (NORVASC) 5 MG tablet TAKE 1 TABLET (5 MG TOTAL) BY MOUTH DAILY.   rosuvastatin (CRESTOR) 20 MG tablet Take 1 tablet (20 mg total) by mouth daily.   [DISCONTINUED] nicotine (NICODERM CQ - DOSED IN MG/24 HOURS) 14 mg/24hr patch Place 1 patch (14 mg total) onto the skin daily. (Patient not taking: Reported on 04/28/2023)   No facility-administered medications prior to visit.    Review of Systems  Constitutional:  Negative for appetite change, chills and fever.  Respiratory:  Negative for chest tightness, shortness of breath and wheezing.   Cardiovascular:  Negative for chest pain and palpitations.  Gastrointestinal:  Negative for abdominal pain, nausea and vomiting.  Musculoskeletal:  Positive for myalgias (muscle spasms in hands).         Objective    BP 122/88 (BP Location: Right Arm, Patient Position: Sitting, Cuff Size: Normal)   Pulse 98   Temp 97.7 F (36.5 C) (Temporal)   Resp 12   Ht 5\' 6"  (1.676 m)   Wt 147 lb 3.2 oz (66.8 kg)   SpO2 99%   BMI 23.76 kg/m      Physical Exam Vitals and nursing note reviewed.  Constitutional:      General: He is not in acute distress.    Appearance: Normal appearance.  HENT:     Head: Normocephalic and atraumatic.  Eyes:  General: No scleral icterus.    Conjunctiva/sclera: Conjunctivae normal.  Cardiovascular:     Rate and Rhythm: Normal rate.  Pulmonary:     Effort: Pulmonary effort is normal.  Neurological:     Mental Status: He is alert and oriented to person, place, and time. Mental status is at baseline.  Psychiatric:        Mood and Affect: Mood normal.        Behavior: Behavior normal.      The 10-year ASCVD risk score (Arnett DK, et al., 2019) is: 17.2%   Values used to calculate the score:     Age: 7 years     Sex: Male     Is Non-Hispanic African American: Yes     Diabetic: No     Tobacco smoker: Yes     Systolic Blood Pressure: 122 mmHg     Is BP treated: Yes     HDL Cholesterol: 55  mg/dL     Total Cholesterol: 182 mg/dL    Assessment & Plan    Essential hypertension Assessment & Plan: Patient's blood pressure is improved today.  As he has only been taking it for about 3 weeks, we will continue him on amlodipine 5 mg.  If he remains elevated at the next visit, we will consider increasing the dose at that time.   Alcohol abuse, daily use Assessment & Plan: Patient has significantly improved his alcohol use and reports that he frequently does not drink during the weekdays.  Strongly encouraged him to continue this behavior.  Will continue to monitor.   Hypercalcemia Assessment & Plan: Re-check elevated calcium today.  If this is normal, will decrease your cholesterol medication (rosuvastatin) to 10 mg every night before bed.  If we do this, we will restart him on the 20 mg pills after 1 month of the lower dose.  Orders: -     Vitamin D 1,25 dihydroxy -     VITAMIN D 25 Hydroxy (Vit-D Deficiency, Fractures) -     Parathyroid hormone, intact (no Ca) -     Calcium, ionized -     Comprehensive metabolic panel  Cramping of hands Assessment & Plan: We will be checking calcium to evaluate this as a potential cause for the patient's cramping.  Also discussed the possibility of reducing the patient's rosuvastatin dose temporarily since that is the most recent change.  Patient had been on amlodipine before without problem, so this is less likely to be the source of his cramping.  Orders: -     Calcium, ionized  Cramping of feet -     Calcium, ionized -     Comprehensive metabolic panel  Elevated liver enzymes -     Comprehensive metabolic panel -     Gamma GT -     Protime-INR    Return in about 3 months (around 07/29/2023) for HTN.      I discussed the assessment and treatment plan with the patient  The patient was provided an opportunity to ask questions and all were answered. The patient agreed with the plan and demonstrated an understanding of the  instructions.   The patient was advised to call back or seek an in-person evaluation if the symptoms worsen or if the condition fails to improve as anticipated.    Sherlyn Hay, DO  [facility] [phone] (phone) [phone] (fax)  [facility]

## 2023-04-28 NOTE — Patient Instructions (Addendum)
Remember to talk with GI (stomach doctor) about your hepatitis C result.  We will check your elevated calcium today.  If this is normal, we will decrease your cholesterol medication (rosuvastatin) to 10 mg every night before bed.  Hold onto the 20 mg pills and we will restart them after 1 month of the lower dose.

## 2023-04-29 LAB — COMPREHENSIVE METABOLIC PANEL
ALT: 77 IU/L — ABNORMAL HIGH (ref 0–44)
AST: 96 IU/L — ABNORMAL HIGH (ref 0–40)
Albumin: 4.4 g/dL (ref 3.8–4.9)
Alkaline Phosphatase: 99 IU/L (ref 44–121)
Bilirubin Total: 0.6 mg/dL (ref 0.0–1.2)
CO2: 24 mmol/L (ref 20–29)
Calcium: 10.4 mg/dL — ABNORMAL HIGH (ref 8.7–10.2)
Globulin, Total: 3.9 g/dL (ref 1.5–4.5)
Total Protein: 8.3 g/dL (ref 6.0–8.5)
eGFR: 102 mL/min/{1.73_m2} (ref >59)

## 2023-04-29 LAB — PARATHYROID HORMONE, INTACT (NO CA): PTH: 13 pg/mL — ABNORMAL LOW (ref 15–65)

## 2023-04-29 LAB — PROTIME-INR: Prothrombin Time: 11.4 s (ref 9.1–12.0)

## 2023-04-29 LAB — CALCIUM, IONIZED

## 2023-04-29 LAB — VITAMIN D 25 HYDROXY (VIT D DEFICIENCY, FRACTURES): Vit D, 25-Hydroxy: 37 ng/mL (ref 30.0–100.0)

## 2023-04-30 LAB — COMPREHENSIVE METABOLIC PANEL
BUN/Creatinine Ratio: 10 (ref 9–20)
BUN: 8 mg/dL (ref 6–24)
Chloride: 103 mmol/L (ref 96–106)
Creatinine, Ser: 0.84 mg/dL (ref 0.76–1.27)
Glucose: 90 mg/dL (ref 70–99)
Potassium: 4.8 mmol/L (ref 3.5–5.2)
Sodium: 142 mmol/L (ref 134–144)

## 2023-04-30 LAB — VITAMIN D 1,25 DIHYDROXY

## 2023-04-30 LAB — PROTIME-INR: INR: 1.1 (ref 0.9–1.2)

## 2023-04-30 LAB — GAMMA GT: GGT: 111 IU/L — ABNORMAL HIGH (ref 0–65)

## 2023-05-02 NOTE — Progress Notes (Signed)
Order(s) created erroneously. Erroneous order ID: [id number]  Order moved by: Deatra Canter  Order move date/time: 05/02/2023 11:15 AM  Source Patient: [id number]  Source Contact: 04/28/2023  Destination Patient: [id number]  Destination Contact: 12/26/2012

## 2023-05-02 NOTE — Progress Notes (Signed)
Order(s) created erroneously. Erroneous order ID: [id number]  Order moved by: Deatra Canter  Order move date/time: 05/02/2023 11:16 AM  Source Patient: [id number]  Source Contact: 04/28/2023  Destination Patient: [id number]  Destination Contact: 12/26/2012

## 2023-05-02 NOTE — Progress Notes (Signed)
Order(s) created erroneously. Erroneous order ID: [id number]  Order moved by: Deatra Canter  Order move date/time: 05/02/2023 11:15 AM  Source Patient: [id number]  Source Contact: 04/28/2023  Destination Patient: [id number]  Destination Contact: 12/26/2012

## 2023-05-02 NOTE — Progress Notes (Signed)
Order(s) created erroneously. Erroneous order ID: [id number]  Order moved by: Deatra Canter  Order move date/time: 05/02/2023 11:16 AM  Source Patient: [id number]  Source Contact: 04/28/2023  Destination Patient: [id number]  Destination Contact: 12/26/2012

## 2023-05-02 NOTE — Progress Notes (Signed)
Order(s) created erroneously. Erroneous order ID: [id number]  Order moved by: Deatra Canter  Order move date/time: 05/02/2023 11:12 AM  Source Patient: [id number]  Source Contact: 04/28/2023  Destination Patient: [id number]  Destination Contact: 12/26/2012

## 2023-05-02 NOTE — Progress Notes (Signed)
Order(s) created erroneously. Erroneous order ID: [id number]  Order moved by: Deatra Canter  Order move date/time: 05/02/2023 11:14 AM  Source Patient: [id number]  Source Contact: 04/28/2023  Destination Patient: [id number]  Destination Contact: 12/26/2012

## 2023-05-04 ENCOUNTER — Ambulatory Visit: Payer: Self-pay | Admitting: *Deleted

## 2023-05-04 DIAGNOSIS — R748 Abnormal levels of other serum enzymes: Secondary | ICD-10-CM | POA: Insufficient documentation

## 2023-05-04 DIAGNOSIS — F1010 Alcohol abuse, uncomplicated: Secondary | ICD-10-CM

## 2023-05-04 DIAGNOSIS — R252 Cramp and spasm: Secondary | ICD-10-CM

## 2023-05-04 HISTORY — DX: Hypercalcemia: E83.52

## 2023-05-04 HISTORY — DX: Cramp and spasm: R25.2

## 2023-05-04 HISTORY — DX: Alcohol abuse, uncomplicated: F10.10

## 2023-05-04 NOTE — Assessment & Plan Note (Signed)
Patient has significantly improved his alcohol use and reports that he frequently does not drink during the weekdays.  Strongly encouraged him to continue this behavior.  Will continue to monitor.

## 2023-05-04 NOTE — Assessment & Plan Note (Signed)
We will be checking calcium to evaluate this as a potential cause for the patient's cramping.  Also discussed the possibility of reducing the patient's rosuvastatin dose temporarily since that is the most recent change.  Patient had been on amlodipine before without problem, so this is less likely to be the source of his cramping.

## 2023-05-04 NOTE — Assessment & Plan Note (Signed)
Patient's blood pressure is improved today.  As he has only been taking it for about 3 weeks, we will continue him on amlodipine 5 mg.  If he remains elevated at the next visit, we will consider increasing the dose at that time.

## 2023-05-04 NOTE — Telephone Encounter (Signed)
Message from Phill Myron sent at 05/04/2023 12:23 PM EDT  Summary: itching after taking medication   Patient states he is having a reaction after taking the medication: rosuvastatin (CRESTOR) 20 MG tablet medication it is making him itch.          Call History  Contact Date/Time Type Contact Phone/Fax User  05/04/2023 12:20 PM EDT Phone (Incoming) Ahamed, [place] (Self) [phone] Robbie Louis   Reason for Disposition  [1] Caller has URGENT medicine question about med that PCP or specialist prescribed AND [2] triager unable to answer question    Crestor is causing him to itch.   When he doesn't take it he stops itching.   Dr. Payton Mccallum mentioned lowering his dose from 20 mg to 10 mg depending on one last lab result she is waiting to come in per a MyChart message Dr. Payton Mccallum sent him this morning.   He last took Crestor on 7/21 and he has not been itching since then.  Answer Assessment - Initial Assessment Questions 1. NAME of MEDICINE: "What medicine(s) are you calling about?"     Crestor is making me itch.   04/06/2023  20 mg was started.  I've got some bumps on my chest.  I'm itching on my face.   It is causing me to have cramps on my right side in my right leg and arm.    Dr. Payton Mccallum mentioned she was  going to reduce it depending on what one of my lab results came back as.    I got a message that Dr. Payton Mccallum may reduce the dose via MyChart today.   She is still waiting on one more test result to come back.   She mentioned lowering the dose to 10 mg depending on the last test result she is waiting on.   2. QUESTION: "What is your question?" (e.g., double dose of medicine, side effect)     I needed to know if she is going to reduce the dose of Crestor. I also wanted to let her know that I started itching after starting the Crestor 20 mg.  I didn't take it one day and I did not itch.   I took it again and I was itching again.    I have not taken any more Crestor  since 05/01/2023 and I'm not itching any more.    I wanted to let Dr. Payton Mccallum know that before she does anything with my dose. I let him know I would send Dr. Payton Mccallum a message and let her know about the itching so she could factor that into her decision after the last lab result came in.    3. PRESCRIBER: "Who prescribed the medicine?" Reason: if prescribed by specialist, call should be referred to that group.     Dr. Payton Mccallum 4. SYMPTOMS: "Do you have any symptoms?" If Yes, ask: "What symptoms are you having?"  "How bad are the symptoms (e.g., mild, moderate, severe)     Itching.     If I don't take it I don't itch.   I start it back and I'm itching again.   I did get little bumps on my chest and my face was itching.   I took the last dose on 7/21 and I am not itching any more. 5. PREGNANCY:  "Is there any chance that you are pregnant?" "When was your last menstrual period?"     N/A  Protocols used: Medication Question Call-A-AH

## 2023-05-04 NOTE — Assessment & Plan Note (Signed)
Re-check elevated calcium today.  If this is normal, will decrease your cholesterol medication (rosuvastatin) to 10 mg every night before bed.  If we do this, we will restart him on the 20 mg pills after 1 month of the lower dose.

## 2023-05-04 NOTE — Telephone Encounter (Signed)
  Chief Complaint: Crestor is causing him to itch.   He received a message via MyChart from Dr. Payton Mccallum that she is waiting on one more lab result to come back before deciding whether to lower his dose of Crestor from 20 mg to 10 mg.   When he does not take the Crestor he stops itching but when he restarts it he is itching again. Symptoms: Little bumps on his chest and itching on his face and various areas.    He also c/o having cramps when taking the Crestor. Frequency: Itches when he takes it and stops itching when he does not take it. Pertinent Negatives: Patient denies rashes but did get little bumps on his chest. Disposition: [] ED /[] Urgent Care (no appt availability in office) / [] Appointment(In office/virtual)/ []  [facility]/ [] Home Care/ [] Refused Recommended Disposition /[] [place] [facility]/ [x]  Follow-up with PCP Additional Notes: Message sent to Dr. Payton Mccallum.    Pt. Agreeable to someone calling him back.

## 2023-05-05 ENCOUNTER — Encounter: Payer: Self-pay | Admitting: Family Medicine

## 2023-05-11 ENCOUNTER — Encounter: Payer: Self-pay | Admitting: Physician Assistant

## 2023-05-11 ENCOUNTER — Ambulatory Visit (INDEPENDENT_AMBULATORY_CARE_PROVIDER_SITE_OTHER): Payer: 59 | Admitting: Physician Assistant

## 2023-05-11 VITALS — BP 144/98 | HR 83 | Temp 97.9°F | Ht 66.0 in | Wt 147.5 lb

## 2023-05-11 DIAGNOSIS — B1710 Acute hepatitis C without hepatic coma: Secondary | ICD-10-CM

## 2023-05-11 DIAGNOSIS — K625 Hemorrhage of anus and rectum: Secondary | ICD-10-CM

## 2023-05-11 NOTE — Progress Notes (Signed)
Celso Amy, PA-C [address]  [address]  [place], [place] [address]  Main: [phone]  Fax: [phone]   Gastroenterology Consultation  Referring Provider:     Sherlyn Hay, DO Primary Care Physician:  Sherlyn Hay, DO Primary Gastroenterologist:  Celso Amy, PA-C / Dr. Wyline Mood   Reason for Consultation:     Rectal bleeding        HPI:   Nicholas Mccoy is a 57 y.o. y/o male referred for consultation & management  by Sherlyn Hay, DO.  Referred to evaluate rectal bleeding.  No previous colonoscopy.  No family history of colon cancer.  Patient states he has had intermittent rectal bleeding since February 2024.  Comes and goes.  He has seen bright red blood on the tissue and in the toilet after bowel movements.  He denies abdominal pain, constipation, diarrhea, rectal pain, or weight loss.  Patient has daily alcohol use for many years.  History of hypertension and elevated LFTs.  Recently diagnosed with active hepatitis C, no previous treatment.  Patient states he was initially diagnosed with hepatitis C 7 or 8 years ago.  He has never had treatment.    Labs 04/28/23 showed elevated AST 96, ALT 77.  Normal total bilirubin 0.6, alk phos 99.  Elevated calcium 10.4.  Otherwise normal BMP.  Low PTH 13.  Elevated GGT 111.  PT 11.4, INR 1.1.  Lab 03/2023 showed positive hepatitis C antibody, reactive.  Positive HCVRNA 410,000 IU/mL.  Labs consistent with active hepatitis C infection.  Lab 03/2023 showed AST 122, ALT 104.  Normal CBC with hemoglobin 16.7, platelets 164.  No recent abdominal imaging   History reviewed. No pertinent past medical history.  Past Surgical History:  Procedure Laterality Date   NO PAST SURGERIES      Prior to Admission medications   Medication Sig Start Date End Date Taking? Authorizing Provider  amLODipine (NORVASC) 5 MG tablet TAKE 1 TABLET (5 MG TOTAL) BY MOUTH DAILY. 04/25/23 04/24/24  Sherlyn Hay, DO  rosuvastatin (CRESTOR)  20 MG tablet Take 1 tablet (20 mg total) by mouth daily. 04/06/23   Sherlyn Hay, DO    Family History  Problem Relation Age of Onset   Hypertension Father    Heart disease Father    Heart attack Father    Heart attack Sister    Hypertension Brother    Healthy Brother    Sickle cell anemia Sister    Hypertension Mother    Hypertension Maternal Grandmother    Hypertension Maternal Grandfather    Hypertension Paternal Grandmother    Hypertension Paternal Grandfather      Social History   Tobacco Use   Smoking status: Every Day    Current packs/day: 0.50    Average packs/day: 0.5 packs/day for 25.0 years (12.5 ttl pk-yrs)    Types: Cigarettes   Smokeless tobacco: Never  Substance Use Topics   Alcohol use: Yes    Comment: stopped drinking 11/2017   Drug use: No    Allergies as of 05/11/2023   (No Known Allergies)    Review of Systems:    All systems reviewed and negative except where noted in HPI.   Physical Exam:  BP (!) 142/96   Pulse 83   Temp 97.9 F (36.6 C)   Ht 5\' 6"  (1.676 m)   Wt 147 lb 8 oz (66.9 kg)   BMI 23.81 kg/m  No LMP for male patient. Psych:  Alert and cooperative. Normal mood and affect. General:   Alert,  Well-developed, well-nourished, pleasant and cooperative in NAD Head:  Normocephalic and atraumatic. Eyes:  Sclera clear, no icterus.   Conjunctiva pink. Neck:  Supple; no masses or thyromegaly. Lungs:  Respirations even and unlabored.  Clear throughout to auscultation.   No wheezes, crackles, or rhonchi. No acute distress. Heart:  Regular rate and rhythm; no murmurs, clicks, rubs, or gallops. Abdomen:  Normal bowel sounds.  No bruits.  Soft, and non-distended without masses, hepatosplenomegaly or hernias noted.  No Tenderness.  No guarding or rebound tenderness.    Neurologic:  Alert and oriented x3;  grossly normal neurologically. Psych:  Alert and cooperative. Normal mood and affect.  Imaging Studies: No results found.  Assessment  and Plan:   Nicholas Mccoy is a 57 y.o. y/o male has been referred for:  1.  Active hepatitis C infection; no previous treatment  Labs: HIV, hepatitis C genotype, HCV RNA, hepatitis A and B labs.  RUQ ultrasound with elastography to check for fibrosis.  Follow-up with MD after colonoscopy to initiate treatment for HCV.  He is advised to stop all alcohol.  Patient education handout was given and discussed from up-to-date.  Preventive measures to prevent transmission was discussed.  2.  Rectal bleeding; recent hemoglobin 16.7g.  No anemia.  No previous colonoscopy.  Scheduling Colonoscopy I discussed risks of colonoscopy with patient to include risk of bleeding, colon perforation, and risk of sedation.  Patient expressed understanding and agrees to proceed with colonoscopy.   Follow up with MD after colonoscopy to discuss hepatitis C treatment.  Celso Amy, PA-C

## 2023-05-11 NOTE — Patient Instructions (Addendum)
Ultrasound scheduled @ [facility]  on 05/16/23 @ 8;15 arrival. Nothing to eat/drink after midnight.

## 2023-05-13 NOTE — Progress Notes (Signed)
1.  He is immune to hepatitis A.  No active hepatitis A infection. 2.  He is not immune to hepatitis B.  Please offer hepatitis B vaccine. 3.  HIV test is negative. 4.  Still waiting on hepatitis C genotype result.

## 2023-05-16 ENCOUNTER — Ambulatory Visit
Admission: RE | Admit: 2023-05-16 | Discharge: 2023-05-16 | Disposition: A | Discharge status: Home / Self Care | Payer: 59 | Source: Ambulatory Visit | Attending: Physician Assistant | Admitting: Physician Assistant

## 2023-05-16 ENCOUNTER — Telehealth: Payer: Self-pay

## 2023-05-16 DIAGNOSIS — K625 Hemorrhage of anus and rectum: Secondary | ICD-10-CM

## 2023-05-16 NOTE — Telephone Encounter (Signed)
.  Left message for patient to return call to discuss lab results.      He is immune to hepatitis A.  No active hepatitis A infection.  2.  He is not immune to hepatitis B.  Please offer hepatitis B vaccine.  3.  HIV test is negative.  4.  Still waiting on hepatitis C genotype result.
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
## 2023-05-18 ENCOUNTER — Ambulatory Visit
Admission: RE | Admit: 2023-05-18 | Discharge: 2023-05-18 | Disposition: A | Discharge status: Home / Self Care | Payer: 59 | Source: Ambulatory Visit | Attending: Physician Assistant | Admitting: Physician Assistant

## 2023-05-18 DIAGNOSIS — Z944 Liver transplant status: Secondary | ICD-10-CM | POA: Diagnosis not present

## 2023-05-18 DIAGNOSIS — K625 Hemorrhage of anus and rectum: Secondary | ICD-10-CM | POA: Diagnosis not present

## 2023-05-18 DIAGNOSIS — B1920 Unspecified viral hepatitis C without hepatic coma: Secondary | ICD-10-CM | POA: Diagnosis not present

## 2023-05-18 DIAGNOSIS — K709 Alcoholic liver disease, unspecified: Secondary | ICD-10-CM | POA: Diagnosis not present

## 2023-05-18 DIAGNOSIS — R932 Abnormal findings on diagnostic imaging of liver and biliary tract: Secondary | ICD-10-CM | POA: Diagnosis not present

## 2023-05-23 NOTE — Progress Notes (Signed)
Notify patient RUQ ultrasound shows normal liver and normal gallbladder.  No liver lesions or gallstones.  Very good elastography score 5.5 shows no evidence of cirrhosis or scar tissue in the liver.  Continue with current plan.

## 2023-05-24 ENCOUNTER — Telehealth: Payer: Self-pay

## 2023-05-24 NOTE — Telephone Encounter (Signed)
Left message for patient to return call to office regarding results.  Notify patient RUQ ultrasound shows normal liver and normal gallbladder.  No liver lesions or gallstones.  Very good elastography score 5.5 shows no evidence of cirrhosis or scar tissue in the liver.  Continue with current plan.

## 2023-05-25 NOTE — Telephone Encounter (Signed)
Left message for patient to return call to office regarding Ultrasound results.

## 2023-05-26 NOTE — Telephone Encounter (Signed)
Tired reaching patient multiple times-results message sent to my chart.

## 2023-06-21 ENCOUNTER — Encounter: Payer: Self-pay | Admitting: Gastroenterology

## 2023-06-27 ENCOUNTER — Telehealth: Payer: Self-pay | Admitting: Physician Assistant

## 2023-06-27 ENCOUNTER — Telehealth: Payer: Self-pay | Admitting: *Deleted

## 2023-06-27 MED ORDER — PEG 3350-KCL-NABCB-NACL-NASULF 236 G PO SOLR: 4000.0000 mL | Freq: Once | ORAL | 0 refills | Status: AC

## 2023-06-27 MED ORDER — GAVILYTE-C 240 G PO SOLR: 4000.0000 mL | Freq: Once | ORAL | 0 refills | Status: AC

## 2023-06-27 NOTE — Telephone Encounter (Signed)
Patient called office because he was not able to pick up his prep solution. Checked and apologize to patient. Sent Rx to [facility] as requested.  Verified Rx is in the process to be filled for patient.  Patient verbalized understanding.

## 2023-06-27 NOTE — Telephone Encounter (Signed)
This must be an old message, I have already spoken to patient this morning, 2 times.

## 2023-06-27 NOTE — Telephone Encounter (Signed)
Pt requesting prep to be called in for procedure to [facility].

## 2023-06-28 ENCOUNTER — Ambulatory Visit: Payer: 59 | Admitting: Certified Registered"

## 2023-06-28 ENCOUNTER — Ambulatory Visit
Admission: RE | Admit: 2023-06-28 | Discharge: 2023-06-28 | Disposition: A | Discharge status: Home / Self Care | Payer: 59 | Attending: Gastroenterology | Admitting: Gastroenterology

## 2023-06-28 ENCOUNTER — Encounter: Payer: Self-pay | Admitting: Gastroenterology

## 2023-06-28 ENCOUNTER — Encounter
Admission: RE | Disposition: A | Discharge status: Home / Self Care | Payer: Self-pay | Source: Home / Self Care | Attending: Gastroenterology

## 2023-06-28 DIAGNOSIS — D123 Benign neoplasm of transverse colon: Secondary | ICD-10-CM | POA: Insufficient documentation

## 2023-06-28 DIAGNOSIS — Z8619 Personal history of other infectious and parasitic diseases: Secondary | ICD-10-CM | POA: Diagnosis not present

## 2023-06-28 DIAGNOSIS — Z8249 Family history of ischemic heart disease and other diseases of the circulatory system: Secondary | ICD-10-CM | POA: Insufficient documentation

## 2023-06-28 DIAGNOSIS — D126 Benign neoplasm of colon, unspecified: Secondary | ICD-10-CM

## 2023-06-28 DIAGNOSIS — I10 Essential (primary) hypertension: Secondary | ICD-10-CM | POA: Insufficient documentation

## 2023-06-28 DIAGNOSIS — K635 Polyp of colon: Secondary | ICD-10-CM

## 2023-06-28 DIAGNOSIS — K649 Unspecified hemorrhoids: Secondary | ICD-10-CM | POA: Diagnosis not present

## 2023-06-28 DIAGNOSIS — K625 Hemorrhage of anus and rectum: Secondary | ICD-10-CM | POA: Diagnosis not present

## 2023-06-28 DIAGNOSIS — D128 Benign neoplasm of rectum: Secondary | ICD-10-CM | POA: Diagnosis not present

## 2023-06-28 DIAGNOSIS — K640 First degree hemorrhoids: Secondary | ICD-10-CM | POA: Diagnosis not present

## 2023-06-28 DIAGNOSIS — F17210 Nicotine dependence, cigarettes, uncomplicated: Secondary | ICD-10-CM | POA: Insufficient documentation

## 2023-06-28 HISTORY — PX: POLYPECTOMY: SHX5525

## 2023-06-28 HISTORY — PX: COLONOSCOPY WITH PROPOFOL: SHX5780

## 2023-06-28 HISTORY — DX: Hemorrhage of anus and rectum: K62.5

## 2023-06-28 SURGERY — COLONOSCOPY WITH PROPOFOL
Anesthesia: General

## 2023-06-28 MED ORDER — LIDOCAINE HCL (CARDIAC) PF 100 MG/5ML IV SOSY
PREFILLED_SYRINGE | INTRAVENOUS | Status: DC | PRN
  Administered 2023-06-28: 50 mg via INTRAVENOUS

## 2023-06-28 MED ORDER — PROPOFOL 10 MG/ML IV BOLUS
INTRAVENOUS | Status: DC | PRN
  Administered 2023-06-28: 50 mg via INTRAVENOUS

## 2023-06-28 MED ORDER — SODIUM CHLORIDE 0.9 % IV SOLN: INTRAVENOUS | Status: DC

## 2023-06-28 MED ORDER — PROPOFOL 500 MG/50ML IV EMUL
INTRAVENOUS | Status: DC | PRN
  Administered 2023-06-28: 150 ug/kg/min via INTRAVENOUS

## 2023-06-28 NOTE — Op Note (Signed)
[facility] Gastroenterology Patient Name: Nicholas Mccoy Procedure Date: 06/28/2023 9:43 AM MRN: [id number] Account #: [id number] Date of Birth: [birth year] Admit Type: Outpatient Age: 57 Room: [facility] ENDO ROOM 2 Gender: Male Note Status: Finalized Instrument Name: Nelda Marseille [id number] Procedure:             Colonoscopy Indications:           Rectal bleeding Providers:             Wyline Mood MD, MD Referring MD:          Jacquenette Shone Medicines:             Monitored Anesthesia Care Complications:         No immediate complications. Procedure:             Pre-Anesthesia Assessment:                        - Prior to the procedure, a History and Physical was                         performed, and patient medications, allergies and                         sensitivities were reviewed. The patient's tolerance                         of previous anesthesia was reviewed.                        - The risks and benefits of the procedure and the                         sedation options and risks were discussed with the                         patient. All questions were answered and informed                         consent was obtained.                        - ASA Grade Assessment: II - A patient with mild                         systemic disease.                        After obtaining informed consent, the colonoscope was                         passed under direct vision. Throughout the procedure,                         the patient's blood pressure, pulse, and oxygen                         saturations were monitored continuously. The                         Colonoscope was introduced through the anus and  advanced to the the cecum, identified by the                         appendiceal orifice. The colonoscopy was performed                         with ease. The patient tolerated the procedure well.                         The quality of the  bowel preparation was excellent.                         The ileocecal valve, appendiceal orifice, and rectum                         were photographed. Findings:      The perianal and digital rectal examinations were normal.      A 5 mm polyp was found in the transverse colon. The polyp was sessile.       The polyp was removed with a cold snare. Resection and retrieval were       complete.      A 10 mm polyp was found in the rectum. The polyp was semi-pedunculated.       The polyp was removed with a hot snare. Resection and retrieval were       complete.      Four sessile polyps were found in the rectum. The polyps were 5 to 6 mm       in size. These polyps were removed with a cold snare. Resection and       retrieval were complete.      Non-bleeding internal hemorrhoids were found during retroflexion. The       hemorrhoids were large and Grade I (internal hemorrhoids that do not       prolapse).      The exam was otherwise without abnormality on direct and retroflexion       views. Impression:            - One 5 mm polyp in the transverse colon, removed with                         a cold snare. Resected and retrieved.                        - One 10 mm polyp in the rectum, removed with a hot                         snare. Resected and retrieved.                        - Four 5 to 6 mm polyps in the rectum, removed with a                         cold snare. Resected and retrieved.                        - Non-bleeding internal hemorrhoids.                        - The  examination was otherwise normal on direct and                         retroflexion views. Recommendation:        - Discharge patient to home (with escort).                        - Resume previous diet.                        - Continue present medications.                        - Await pathology results.                        - Repeat colonoscopy for surveillance based on                         pathology  results. Procedure Code(s):     --- Professional ---                        [phone], Colonoscopy, flexible; with removal of                         tumor(s), polyp(s), or other lesion(s) by snare                         technique Diagnosis Code(s):     --- Professional ---                        D12.3, Benign neoplasm of transverse colon (hepatic                         flexure or splenic flexure)                        D12.8, Benign neoplasm of rectum                        K64.0, First degree hemorrhoids                        K62.5, Hemorrhage of anus and rectum CPT copyright 2022 American Medical Association. All rights reserved. The codes documented in this report are preliminary and upon coder review may  be revised to meet current compliance requirements. Wyline Mood, MD Wyline Mood MD, MD 06/28/2023 10:17:41 AM This report has been signed electronically. Number of Addenda: 0 Note Initiated On: 06/28/2023 9:43 AM Scope Withdrawal Time: 0 hours 16 minutes 9 seconds  Total Procedure Duration: 0 hours 21 minutes 57 seconds  Estimated Blood Loss:  Estimated blood loss: none.      [facility]

## 2023-06-28 NOTE — Anesthesia Postprocedure Evaluation (Signed)
Anesthesia Post Note  Patient: Nicholas Mccoy  Procedure(s) Performed: COLONOSCOPY WITH PROPOFOL POLYPECTOMY  Patient location during evaluation: PACU Anesthesia Type: General Level of consciousness: awake and alert Pain management: pain level controlled Vital Signs Assessment: post-procedure vital signs reviewed and stable Respiratory status: spontaneous breathing, nonlabored ventilation, respiratory function stable and patient connected to nasal cannula oxygen Cardiovascular status: blood pressure returned to baseline and stable Postop Assessment: no apparent nausea or vomiting Anesthetic complications: no  No notable events documented.   Last Vitals:  Vitals:   06/28/23 0910 06/28/23 1020  BP: (!) 151/94 109/81  Pulse: 60   Resp: 16   Temp: (!) 36.3 C (!) 36.3 C  SpO2: 100%     Last Pain:  Vitals:   06/28/23 1020  TempSrc: Temporal  PainSc: 0-No pain                 Stephanie Coup
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
## 2023-06-28 NOTE — Transfer of Care (Signed)
Immediate Anesthesia Transfer of Care Note  Patient: Nicholas Mccoy  Procedure(s) Performed: COLONOSCOPY WITH PROPOFOL POLYPECTOMY  Patient Location: PACU and Endoscopy Unit  Anesthesia Type:General  Level of Consciousness: drowsy and patient cooperative  Airway & Oxygen Therapy: Patient Spontanous Breathing  Post-op Assessment: Report given to RN and Post -op Vital signs reviewed and stable  Post vital signs: Reviewed and stable  Last Vitals:  Vitals Value Taken Time  BP 109/81 06/28/23 1019  Temp    Pulse 79 06/28/23 1020  Resp 20 06/28/23 1020  SpO2 99 % 06/28/23 1020  Vitals shown include unfiled device data.  Last Pain:  Vitals:   06/28/23 0910  TempSrc: Temporal         Complications: No notable events documented.

## 2023-06-28 NOTE — Anesthesia Procedure Notes (Signed)
Procedure Name: MAC Date/Time: 06/28/2023 9:53 AM  Performed by: Elmarie Mainland, CRNAPre-anesthesia Checklist: Patient identified, Emergency Drugs available, Suction available and Patient being monitored Patient Re-evaluated:Patient Re-evaluated prior to induction Oxygen Delivery Method: Nasal cannula

## 2023-06-28 NOTE — H&P (Signed)
Nicholas Mood, MD [address], [place], [place], [address] [address], [place], [place], [address] Phone: [phone]  Fax: [phone]  Primary Care Physician:  Sherlyn Hay, DO   Pre-Procedure History & Physical: HPI:  Nicholas Mccoy is a 57 y.o. male is here for an colonoscopy.   Past Medical History:  Diagnosis Date   Alcohol abuse    Hepatitis C    Hyperlipidemia    Hypertension     Past Surgical History:  Procedure Laterality Date   NO PAST SURGERIES      Prior to Admission medications   Medication Sig Start Date End Date Taking? Authorizing Provider  amLODipine (NORVASC) 5 MG tablet TAKE 1 TABLET (5 MG TOTAL) BY MOUTH DAILY. 04/25/23 04/24/24 Yes Pardue, Monico Blitz, DO    Allergies as of 05/11/2023   (No Known Allergies)    Family History  Problem Relation Age of Onset   Hypertension Father    Heart disease Father    Heart attack Father    Heart attack Sister    Hypertension Brother    Healthy Brother    Sickle cell anemia Sister    Hypertension Mother    Hypertension Maternal Grandmother    Hypertension Maternal Grandfather    Hypertension Paternal Grandmother    Hypertension Paternal Grandfather     Social History   Socioeconomic History   Marital status: Divorced    Spouse name: Not on file   Number of children: Not on file   Years of education: Not on file   Highest education level: 10th grade  Occupational History   Not on file  Tobacco Use   Smoking status: Every Day    Current packs/day: 0.50    Average packs/day: 0.5 packs/day for 25.0 years (12.5 ttl pk-yrs)    Types: Cigarettes   Smokeless tobacco: Never  Vaping Use   Vaping status: Never Used  Substance and Sexual Activity   Alcohol use: Yes    Comment: stopped drinking 11/2017   Drug use: No   Sexual activity: Yes  Other Topics Concern   Not on file  Social History Narrative   Not on file   Social Determinants of Health   Financial  Resource Strain: Low Risk  (04/27/2023)   Overall Financial Resource Strain (CARDIA)    Difficulty of Paying Living Expenses: Not hard at all  Food Insecurity: No Food Insecurity (04/27/2023)   Hunger Vital Sign    Worried About Running Out of Food in the Last Year: Never true    Ran Out of Food in the Last Year: Never true  Transportation Needs: No Transportation Needs (04/27/2023)   PRAPARE - Administrator, Civil Service (Medical): No    Lack of Transportation (Non-Medical): No  Physical Activity: Sufficiently Active (04/27/2023)   Exercise Vital Sign    Days of Exercise per Week: 5 days    Minutes of Exercise per Session: 30 min  Stress: Patient Declined (04/27/2023)   [employer] of Occupational Health - Occupational Stress Questionnaire    Feeling of Stress : Patient declined  Social Connections: Unknown (04/27/2023)   Social Connection and Isolation Panel [NHANES]    Frequency of Communication with Friends and Family: More than three times a week    Frequency of Social Gatherings with Friends and Family: Twice a week    Attends Religious Services: Patient declined    Database administrator or Organizations: No  Attends Banker Meetings: Not on file    Marital Status: Divorced  Intimate Partner Violence: Not on file    Review of Systems: See HPI, otherwise negative ROS  Physical Exam: BP (!) 151/94   Pulse 60   Temp (!) 97.4 F (36.3 C) (Temporal)   Resp 16   Wt 65.3 kg   SpO2 100%   BMI 23.24 kg/m  General:   Alert,  pleasant and cooperative in NAD Head:  Normocephalic and atraumatic. Neck:  Supple; no masses or thyromegaly. Lungs:  Clear throughout to auscultation, normal respiratory effort.    Heart:  +S1, +S2, Regular rate and rhythm, No edema. Abdomen:  Soft, nontender and nondistended. Normal bowel sounds, without guarding, and without rebound.   Neurologic:  Alert and  oriented x4;  grossly normal  neurologically.  Impression/Plan: Nicholas Mccoy is here for an colonoscopy to be performed for rectal bleeding  Risks, benefits, limitations, and alternatives regarding  colonoscopy have been reviewed with the patient.  Questions have been answered.  All parties agreeable.   Nicholas Mood, MD  06/28/2023, 9:45 AM

## 2023-06-29 ENCOUNTER — Encounter: Payer: Self-pay | Admitting: Gastroenterology

## 2023-07-07 LAB — SURGICAL PATHOLOGY

## 2023-07-11 ENCOUNTER — Encounter: Payer: Self-pay | Admitting: Gastroenterology

## 2023-07-26 ENCOUNTER — Ambulatory Visit: Payer: 59 | Admitting: Gastroenterology

## 2023-07-26 NOTE — Progress Notes (Deleted)
    Wyline Mood MD, MRCP(U.K) [address]  [address]  [place], [place] [address]  Main: [phone]  Fax: [phone]   Primary Care Physician: Sherlyn Hay, DO  Primary Gastroenterologist:  Dr. Wyline Mood   No chief complaint on file.   HPI: USBALDO PANNONE is a 57 y.o. male initially referred and seen for hep C diagnosed 7-8 years back  Gt 1a treatment naive , non cirrhotic , rectal bleeding , history of alochol abuse   Current Outpatient Medications  Medication Sig Dispense Refill   amLODipine (NORVASC) 5 MG tablet TAKE 1 TABLET (5 MG TOTAL) BY MOUTH DAILY. 90 tablet 1   No current facility-administered medications for this visit.    Allergies as of 07/26/2023   (No Known Allergies)       Interval history   ***/***/202*   ***/***/2024   ROS:  General: Negative for anorexia, weight loss, fever, chills, fatigue, weakness. ENT: Negative for hoarseness, difficulty swallowing , nasal congestion. CV: Negative for chest pain, angina, palpitations, dyspnea on exertion, peripheral edema.  Respiratory: Negative for dyspnea at rest, dyspnea on exertion, cough, sputum, wheezing.  GI: See history of present illness. GU:  Negative for dysuria, hematuria, urinary incontinence, urinary frequency, nocturnal urination.  Endo: Negative for unusual weight change.    Physical Examination:   There were no vitals taken for this visit.  General: Well-nourished, well-developed in no acute distress.  Eyes: No icterus. Conjunctivae pink. Mouth: Oropharyngeal mucosa moist and pink , no lesions erythema or exudate. Lungs: Clear to auscultation bilaterally. Non-labored. Heart: Regular rate and rhythm, no murmurs rubs or gallops.  Abdomen: Bowel sounds are normal, nontender, nondistended, no hepatosplenomegaly or masses, no abdominal bruits or hernia , no rebound or guarding.   Extremities: No lower extremity edema. No clubbing or deformities. Neuro: Alert and oriented x 3.   Grossly intact. Skin: Warm and dry, no jaundice.   Psych: Alert and cooperative, normal mood and affect.   Imaging Studies: No results found.  Assessment and Plan:   KAVEH KISSINGER is a 57 y.o. y/o male here to follow up for Hep C Gt 1a  ,VL 410K tratment naive, rectal bleeding. Immune to hep A.USG elastrography normal - no clear evidence of cirrhosis. Albumin 4.4 , plt 164      Dr Wyline Mood  MD,MRCP [facility]) Follow up in ***  BP check ***

## 2023-07-29 ENCOUNTER — Encounter: Payer: Self-pay | Admitting: Family Medicine

## 2023-07-29 ENCOUNTER — Ambulatory Visit (INDEPENDENT_AMBULATORY_CARE_PROVIDER_SITE_OTHER): Payer: BLUE CROSS/BLUE SHIELD | Admitting: Family Medicine

## 2023-07-29 VITALS — BP 135/85 | HR 87 | Temp 98.2°F | Resp 20 | Ht 66.0 in | Wt 148.1 lb

## 2023-07-29 DIAGNOSIS — B182 Chronic viral hepatitis C: Secondary | ICD-10-CM | POA: Insufficient documentation

## 2023-07-29 DIAGNOSIS — I10 Essential (primary) hypertension: Secondary | ICD-10-CM

## 2023-07-29 DIAGNOSIS — Z720 Tobacco use: Secondary | ICD-10-CM

## 2023-07-29 DIAGNOSIS — Z716 Tobacco abuse counseling: Secondary | ICD-10-CM | POA: Diagnosis not present

## 2023-07-29 DIAGNOSIS — E785 Hyperlipidemia, unspecified: Secondary | ICD-10-CM

## 2023-07-29 MED ORDER — ROSUVASTATIN CALCIUM 5 MG PO TABS
5.0000 mg | ORAL_TABLET | Freq: Every day | ORAL | 0 refills | Status: DC
Start: 2023-07-29 — End: 2023-09-26

## 2023-07-29 NOTE — Assessment & Plan Note (Signed)
Patient's blood pressure is elevated today. He reports he has not taken his medication this morning and will be taking it after the visit. Will not change his blood pressure medication due to this. Recheck on next visit.

## 2023-07-29 NOTE — Assessment & Plan Note (Signed)
Discussed options to support tobacco cessation, including nicotine patches, nicotine gum and nicotine lozenges. Patient reports the nicotine patches previously caused him nausea.  Advised him to double check the patches he still has at home to see if they are 21 mg or 7 mg, as the 21 mg patches would be much too high a dose for him at this point. Also discussed possibility of Fum to address oral fixation of bringing something up to his mouth to inhale, as it is a smokeless item.

## 2023-07-29 NOTE — Progress Notes (Signed)
Established patient visit   Patient: Nicholas Mccoy   DOB: 10-17-1965   57 y.o. Male  MRN: [id number] Visit Date: 07/29/2023  Today's healthcare provider: Sherlyn Hay, DO   Chief Complaint  Patient presents with   Follow-up    3 mo f/u HTN    Subjective    HPI Bleeding determined to be from hemorrhoids - not bothering him currently Colonoscopy done - some polyps present; biopsy specimens negative for high-grade dysplasia or malignancy   - Due for follow up 05-31-2026  Mom died in 13-Jun-2023. Buried her 05/21/23. He stopped drinking two days later.  F/u rosuvastatin. Stopped medication fully, cramping has resolved.  Tobacco: Was down to approximately 6 cigarettes/day in July 2024 Still smoking? 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   No facility-administered medications prior to visit.    Review of Systems  Respiratory: Negative.  Negative for cough, shortness of breath and wheezing.   Cardiovascular:  Negative for chest pain, palpitations and leg swelling.  Neurological:  Negative for weakness and headaches.        Objective    BP 135/85 (BP Location: Right Arm, Patient Position: Sitting, Cuff Size: Normal)   Pulse 87   Temp 98.2 F (36.8 C)   Resp 20   Ht 5\' 6"  (1.676 m)   Wt 148 lb 1.6 oz (67.2 kg)   SpO2 100%   BMI 23.90 kg/m     Physical Exam Constitutional:      Appearance: Normal appearance.  HENT:     Head: Normocephalic and atraumatic.  Eyes:     General: No scleral icterus.    Extraocular Movements: Extraocular movements intact.     Conjunctiva/sclera: Conjunctivae normal.  Cardiovascular:     Rate and Rhythm: Normal rate and regular rhythm.     Pulses: Normal pulses.     Heart sounds: Normal heart sounds.  Pulmonary:     Effort:  Pulmonary effort is normal. No respiratory distress.     Breath sounds: Normal breath sounds.  Abdominal:     General: Bowel sounds are normal. There is no distension.     Palpations: Abdomen is soft. There is no mass.     Tenderness: There is no abdominal tenderness. There is no guarding.  Skin:    General: Skin is warm and dry.  Neurological:     Mental Status: He is alert and oriented to person, place, and time. Mental status is at baseline.  Psychiatric:        Mood and Affect: Mood normal.        Behavior: Behavior normal.      No results found for any visits on 07/29/23.  Assessment & Plan    Dyslipidemia, goal LDL below 70 Assessment & Plan: Trying low-dose rosuvastatin If has cramping, he will notify the clinic and we will switch to low-dose alternative If still has cramping with that, will start ezetimibe. Follow-up in 3 months and repeat lipid panel at that point  Orders: -     Rosuvastatin Calcium; Take 1 tablet (5 mg total) by mouth daily.  Dispense: 90 tablet; Refill: 0  Current tobacco use Assessment & Plan: Discussed options to support tobacco cessation, including nicotine patches, nicotine gum and nicotine lozenges. Patient reports  the nicotine patches previously caused him nausea.  Advised him to double check the patches he still has at home to see if they are 21 mg or 7 mg, as the 21 mg patches would be much too high a dose for him at this point. Also discussed possibility of Fum to address oral fixation of bringing something up to his mouth to inhale, as it is a smokeless item.   Encounter for smoking cessation counseling  Essential hypertension Assessment & Plan: Patient's blood pressure is elevated today. He reports he has not taken his medication this morning and will be taking it after the visit. Will not change his blood pressure medication due to this. Recheck on next visit.   Chronic hepatitis C without hepatic coma (HCC) Assessment &  Plan: Did. Following with gastroenterology; will defer to them for management.    Return in about 3 months (around 10/29/2023) for HTN, HLD.      I discussed the assessment and treatment plan with the patient  The patient was provided an opportunity to ask questions and all were answered. The patient agreed with the plan and demonstrated an understanding of the instructions.   The patient was advised to call back or seek an in-person evaluation if the symptoms worsen or if the condition fails to improve as anticipated.  Total time was 40 minutes. That includes chart review before the visit, the actual patient visit, and time spent on documentation after the visit.   Sherlyn Hay, DO  [facility] [phone] (phone) [phone] (fax)  [facility]

## 2023-07-29 NOTE — Assessment & Plan Note (Addendum)
Trying low-dose rosuvastatin If has cramping, he will notify the clinic and we will switch to low-dose alternative If still has cramping with that, will start ezetimibe. Follow-up in 3 months and repeat lipid panel at that point

## 2023-07-29 NOTE — Assessment & Plan Note (Addendum)
Did. Following with gastroenterology; will defer to them for management.

## 2023-09-24 ENCOUNTER — Other Ambulatory Visit: Payer: Self-pay | Admitting: Family Medicine

## 2023-09-24 DIAGNOSIS — I10 Essential (primary) hypertension: Secondary | ICD-10-CM

## 2023-09-24 DIAGNOSIS — E785 Hyperlipidemia, unspecified: Secondary | ICD-10-CM

## 2023-09-26 NOTE — Telephone Encounter (Signed)
Requested Prescriptions  Pending Prescriptions Disp Refills   amLODipine (NORVASC) 5 MG tablet [Pharmacy Med Name: AMLODIPINE BESYLATE 5 MG TAB] 90 tablet 2    Sig: TAKE 1 TABLET (5 MG TOTAL) BY MOUTH DAILY.     Cardiovascular: Calcium Channel Blockers 2 Passed - 09/26/2023  1:04 PM      Passed - Last BP in normal range    BP Readings from Last 1 Encounters:  07/29/23 135/85         Passed - Last Heart Rate in normal range    Pulse Readings from Last 1 Encounters:  07/29/23 87         Passed - Valid encounter within last 6 months    Recent Outpatient Visits           1 month ago Dyslipidemia, goal LDL below 70   [facility] Pardue, Monico Blitz, DO   5 months ago Essential hypertension   [facility] Pardue, Monico Blitz, DO   5 months ago Establishing care with new doctor, encounter for   [facility] Pardue, Monico Blitz, DO   4 years ago Left maxillary sinusitis   [facility] Chrismon, [place], [place]   5 years ago Essential hypertension   [facility] [place], [place], [place]               rosuvastatin (CRESTOR) 5 MG tablet [Pharmacy Med Name: ROSUVASTATIN CALCIUM 5 MG TAB] 90 tablet 1    Sig: TAKE 1 TABLET (5 MG TOTAL) BY MOUTH DAILY.     Cardiovascular:  Antilipid - Statins 2 Failed - 09/26/2023  1:04 PM      Failed - Lipid Panel in normal range within the last 12 months    Cholesterol, Total  Date Value Ref Range Status  04/01/2023 182 100 - 199 mg/dL Final   Cholesterol  Date Value Ref Range Status  09/16/2014 158 0 - 200 mg/dL Final   Ldl Cholesterol, Calc  Date Value Ref Range Status  09/16/2014 83 0 - 100 mg/dL Final   LDL Chol Calc (NIH)  Date Value Ref Range Status  04/01/2023 112 (H) 0 - 99 mg/dL Final   HDL Cholesterol  Date Value Ref Range Status  09/16/2014 67 (H) 40 - 60 mg/dL Final   HDL  Date Value Ref Range Status   04/01/2023 55 >39 mg/dL Final   Triglycerides  Date Value Ref Range Status  04/01/2023 79 0 - 149 mg/dL Final  98/08/9146 42 0 - 200 mg/dL Final         Passed - Cr in normal range and within 360 days    Creatinine  Date Value Ref Range Status  10/23/2014 0.94 0.60 - 1.30 mg/dL Final   Creatinine, Ser  Date Value Ref Range Status  04/28/2023 0.84 0.76 - 1.27 mg/dL Final         Passed - Patient is not pregnant      Passed - Valid encounter within last 12 months    Recent Outpatient Visits           1 month ago Dyslipidemia, goal LDL below 70   [facility] Pardue, Monico Blitz, DO   5 months ago Essential hypertension    [facility] Pardue, Monico Blitz, DO   5 months ago Establishing care with new doctor, encounter for   [facility],  Monico Blitz, DO   4 years ago Left maxillary sinusitis   [facility] Chrismon, Jodell [place], [place]   5 years ago Essential hypertension   [facility] Chrismon, [place], [place]

## 2023-10-24 ENCOUNTER — Emergency Department: Admission: EM | Admit: 2023-10-24 | Discharge: 2023-10-24 | Discharge status: Against Medical Advice | Payer: 59

## 2023-10-24 DIAGNOSIS — J189 Pneumonia, unspecified organism: Secondary | ICD-10-CM | POA: Diagnosis not present

## 2023-10-24 DIAGNOSIS — R000 Tachycardia, unspecified: Secondary | ICD-10-CM | POA: Diagnosis not present

## 2023-10-24 DIAGNOSIS — R079 Chest pain, unspecified: Secondary | ICD-10-CM | POA: Diagnosis not present

## 2023-10-24 DIAGNOSIS — R918 Other nonspecific abnormal finding of lung field: Secondary | ICD-10-CM | POA: Diagnosis not present

## 2023-10-24 DIAGNOSIS — I10 Essential (primary) hypertension: Secondary | ICD-10-CM | POA: Diagnosis not present

## 2023-10-24 DIAGNOSIS — Z743 Need for continuous supervision: Secondary | ICD-10-CM | POA: Diagnosis not present

## 2023-10-24 DIAGNOSIS — C342 Malignant neoplasm of middle lobe, bronchus or lung: Secondary | ICD-10-CM | POA: Diagnosis not present

## 2023-10-24 DIAGNOSIS — J438 Other emphysema: Secondary | ICD-10-CM | POA: Diagnosis not present

## 2023-10-24 DIAGNOSIS — R42 Dizziness and giddiness: Secondary | ICD-10-CM | POA: Diagnosis not present

## 2023-10-24 DIAGNOSIS — R071 Chest pain on breathing: Secondary | ICD-10-CM | POA: Diagnosis not present

## 2023-10-24 DIAGNOSIS — J432 Centrilobular emphysema: Secondary | ICD-10-CM | POA: Diagnosis not present

## 2023-10-24 NOTE — ED Notes (Signed)
 No answer when called several times from lobby

## 2023-10-24 NOTE — ED Notes (Signed)
 Right upper arm g PIV d/c'd  site clean and dry. Catheter intact.

## 2023-10-24 NOTE — ED Notes (Signed)
 First nurse note-pt brought in via ems from home with chest pain.  Pt has dizziness.  Asa 324mg  and ntg spray given by ems.  Iv in place.  Bp 142/95, pulse 101, cbg 119 per ems.  Pt in wheelchair in lobby, alert.

## 2023-10-25 DIAGNOSIS — R918 Other nonspecific abnormal finding of lung field: Secondary | ICD-10-CM | POA: Diagnosis not present

## 2023-10-25 DIAGNOSIS — J432 Centrilobular emphysema: Secondary | ICD-10-CM | POA: Diagnosis not present

## 2023-10-25 DIAGNOSIS — J438 Other emphysema: Secondary | ICD-10-CM | POA: Diagnosis not present

## 2023-10-25 DIAGNOSIS — R079 Chest pain, unspecified: Secondary | ICD-10-CM | POA: Diagnosis not present

## 2023-11-02 ENCOUNTER — Encounter: Payer: Self-pay | Admitting: *Deleted

## 2023-11-02 DIAGNOSIS — R918 Other nonspecific abnormal finding of lung field: Secondary | ICD-10-CM

## 2023-11-02 NOTE — Progress Notes (Signed)
Referral received from [facility] for lung mass. Pt scheduled with Dr. Alena Bills for new pt consult on 11/03/23. Images requested from [facility] and will be uploaded once received. Nothing further needed at this time.

## 2023-11-03 ENCOUNTER — Encounter: Payer: Self-pay | Admitting: Internal Medicine

## 2023-11-03 ENCOUNTER — Ambulatory Visit
Admission: RE | Admit: 2023-11-03 | Discharge: 2023-11-03 | Disposition: A | Discharge status: Home / Self Care | Payer: Self-pay | Source: Ambulatory Visit | Attending: Internal Medicine | Admitting: Internal Medicine

## 2023-11-03 ENCOUNTER — Encounter: Payer: Self-pay | Admitting: *Deleted

## 2023-11-03 ENCOUNTER — Inpatient Hospital Stay: Payer: 59

## 2023-11-03 ENCOUNTER — Inpatient Hospital Stay: Payer: 59 | Attending: Internal Medicine | Admitting: Internal Medicine

## 2023-11-03 VITALS — BP 118/89 | HR 80 | Temp 98.7°F | Resp 18 | Wt 143.0 lb

## 2023-11-03 DIAGNOSIS — I10 Essential (primary) hypertension: Secondary | ICD-10-CM | POA: Insufficient documentation

## 2023-11-03 DIAGNOSIS — Z8619 Personal history of other infectious and parasitic diseases: Secondary | ICD-10-CM | POA: Insufficient documentation

## 2023-11-03 DIAGNOSIS — Z832 Family history of diseases of the blood and blood-forming organs and certain disorders involving the immune mechanism: Secondary | ICD-10-CM | POA: Diagnosis not present

## 2023-11-03 DIAGNOSIS — R918 Other nonspecific abnormal finding of lung field: Secondary | ICD-10-CM

## 2023-11-03 DIAGNOSIS — E785 Hyperlipidemia, unspecified: Secondary | ICD-10-CM | POA: Diagnosis not present

## 2023-11-03 DIAGNOSIS — Z79899 Other long term (current) drug therapy: Secondary | ICD-10-CM | POA: Diagnosis not present

## 2023-11-03 DIAGNOSIS — Z8249 Family history of ischemic heart disease and other diseases of the circulatory system: Secondary | ICD-10-CM | POA: Insufficient documentation

## 2023-11-03 DIAGNOSIS — F17210 Nicotine dependence, cigarettes, uncomplicated: Secondary | ICD-10-CM | POA: Insufficient documentation

## 2023-11-03 DIAGNOSIS — R0602 Shortness of breath: Secondary | ICD-10-CM | POA: Insufficient documentation

## 2023-11-03 DIAGNOSIS — R079 Chest pain, unspecified: Secondary | ICD-10-CM | POA: Diagnosis not present

## 2023-11-03 DIAGNOSIS — B1920 Unspecified viral hepatitis C without hepatic coma: Secondary | ICD-10-CM | POA: Insufficient documentation

## 2023-11-03 DIAGNOSIS — Z720 Tobacco use: Secondary | ICD-10-CM

## 2023-11-03 NOTE — Addendum Note (Signed)
Addended by: Glory Buff on: 11/03/2023 08:18 AM   Modules accepted: Orders

## 2023-11-03 NOTE — Progress Notes (Signed)
[facility] CONSULT NOTE  Patient Care Team: Pardue, Monico Blitz, DO as PCP - General (Family Medicine) Glory Buff, RN as Oncology Nurse Navigator  REFERRING PROVIDER: Dr. Payton Mccallum  REASON FOR REFFERAL: lung mass  CANCER STAGING   Cancer Staging  No matching staging information was found for the patient.   ASSESSMENT & PLAN:  Nicholas Mccoy 58 y.o. male with pmh of alcohol use, hepatitis C, hyperlipidemia, hypertension referred to medical oncology for right lung mass.  # Right lung mass - Patient presented to [facility] ED on 10/25/2023 with complaints of chest pain.  CTA chest showed large enhancing mass in the right lung abutting the pleura appears predominantly located in the posterior aspect of right middle lobe measures 7 x 6.4 x 7 cm.  Right inferior hilar nodule measures 2.7 cm suspicious for nodal involvement.  Encases and severely narrows the right middle lobe bronchus with mass effect and displacement of right superior pulmonary vein.  Additional borderline enlarged right superior hilar lymph node 1 cm.  -Patient is scheduled for PET scan on 11/08/2022 which is needed for staging of the lung cancer. -Scheduled for MRI brain with and without contrast for staging. -Referral to pulmonary for consideration of EBUS/biopsy.  # Active smoker -Discussed about smoking cessation.  Referral to smoking cessation program.  # Hepatitis C positive -Follows with Dr. Tobi Bastos.  He is not on active treatment.   Orders Placed This Encounter  Procedures   Ambulatory referral to Pulmonology    Referral Priority:   Urgent    Referral Type:   Consultation    Referral Reason:   Specialty Services Required    Requested Specialty:   Pulmonary Disease    Number of Visits Requested:   1   RTC in 3 weeks for MD visit.  The total time spent in the appointment was 60 minutes encounter with patients including review of chart and various tests results, discussions about plan of care and  coordination of care plan   All questions were answered. The patient knows to call the clinic with any problems, questions or concerns. No barriers to learning was detected.  Michaelyn Barter, MD 1/23/20254:39 PM   HISTORY OF PRESENTING ILLNESS:  Nicholas Mccoy 58 y.o. male with pmh of alcohol use, hepatitis C, hyperlipidemia, hypertension referred to medical oncology for right lung mass.  Patient presented to [facility] ED on 10/25/2023 with complaints of chest pain.  CTA chest showed large enhancing mass in the right lung abutting the pleura appears predominantly located in the posterior aspect of right middle lobe measures 7 x 6.4 x 7 cm.  Right inferior hilar nodule measures 2.7 cm suspicious for nodal involvement.  Encases and severely narrows the right middle lobe bronchus with mass effect and displacement of right superior pulmonary vein.  Additional borderline enlarged right superior hilar lymph node 1 cm.  He is scheduled for PET scan on 11/08/2022.  MRI brain with and without contrast is ordered pending scheduling.  Patient is an active smoker for several years.  Smokes half to 1 pack a day.  His shortness of breath and chest pain has improved.  He is on antibiotics.   I have reviewed his chart and materials related to his cancer extensively and collaborated history with the patient. Summary of oncologic history is as follows: Oncology History   No history exists.    MEDICAL HISTORY:  Past Medical History:  Diagnosis Date   Alcohol abuse    Hepatitis C  Hyperlipidemia    Hypertension     SURGICAL HISTORY: Past Surgical History:  Procedure Laterality Date   COLONOSCOPY WITH PROPOFOL N/A 06/28/2023   Procedure: COLONOSCOPY WITH PROPOFOL;  Surgeon: Wyline Mood, MD;  Location[facility] ENDOSCOPY;  Service: Gastroenterology;  Laterality: N/A;   NO PAST SURGERIES     POLYPECTOMY  06/28/2023   Procedure: POLYPECTOMY;  Surgeon: Wyline Mood, MD;  Location: [facility] ENDOSCOPY;  Service:  Gastroenterology;;    SOCIAL HISTORY: Social History   Socioeconomic History   Marital status: Divorced    Spouse name: Not on file   Number of children: Not on file   Years of education: Not on file   Highest education level: 10th grade  Occupational History   Not on file  Tobacco Use   Smoking status: Every Day    Current packs/day: 0.50    Average packs/day: 0.5 packs/day for 25.0 years (12.5 ttl pk-yrs)    Types: Cigarettes   Smokeless tobacco: Never  Vaping Use   Vaping status: Never Used  Substance and Sexual Activity   Alcohol use: Yes    Comment: stopped drinking 11/2017   Drug use: No   Sexual activity: Yes  Other Topics Concern   Not on file  Social History Narrative   Not on file   Social Drivers of Health   Financial Resource Strain: Low Risk  (07/25/2023)   Overall Financial Resource Strain (CARDIA)    Difficulty of Paying Living Expenses: Not very hard  Food Insecurity: No Food Insecurity (11/03/2023)   Hunger Vital Sign    Worried About Running Out of Food in the Last Year: Never true    Ran Out of Food in the Last Year: Never true  Transportation Needs: No Transportation Needs (07/25/2023)   PRAPARE - Administrator, Civil Service (Medical): No    Lack of Transportation (Non-Medical): No  Physical Activity: Insufficiently Active (07/25/2023)   Exercise Vital Sign    Days of Exercise per Week: 5 days    Minutes of Exercise per Session: 10 min  Stress: No Stress Concern Present (07/25/2023)   [employer] of Occupational Health - Occupational Stress Questionnaire    Feeling of Stress : Not at all  Social Connections: Moderately Isolated (07/25/2023)   Social Connection and Isolation Panel [NHANES]    Frequency of Communication with Friends and Family: Three times a week    Frequency of Social Gatherings with Friends and Family: Once a week    Attends Religious Services: More than 4 times per year    Active Member of [employer] or  Organizations: No    Attends Engineer, structural: Not on file    Marital Status: Divorced  Intimate Partner Violence: Not At Risk (11/03/2023)   Humiliation, Afraid, Rape, and Kick questionnaire    Fear of Current or Ex-Partner: No    Emotionally Abused: No    Physically Abused: No    Sexually Abused: No    FAMILY HISTORY: Family History  Problem Relation Age of Onset   Hypertension Father    Heart disease Father    Heart attack Father    Heart attack Sister    Hypertension Brother    Healthy Brother    Sickle cell anemia Sister    Hypertension Mother    Hypertension Maternal Grandmother    Hypertension Maternal Grandfather    Hypertension Paternal Grandmother    Hypertension Paternal Grandfather     ALLERGIES:  has no known allergies.  MEDICATIONS:  Current Outpatient Medications  Medication Sig Dispense Refill   amLODipine (NORVASC) 5 MG tablet TAKE 1 TABLET (5 MG TOTAL) BY MOUTH DAILY. 90 tablet 0   amoxicillin-clavulanate (AUGMENTIN) 875-125 MG tablet Take by mouth.     doxycycline (VIBRAMYCIN) 100 MG capsule Take 100 mg by mouth 2 (two) times daily.     rosuvastatin (CRESTOR) 5 MG tablet TAKE 1 TABLET (5 MG TOTAL) BY MOUTH DAILY. 90 tablet 1   No current facility-administered medications for this visit.    REVIEW OF SYSTEMS:   Pertinent information mentioned in HPI All other systems were reviewed with the patient and are negative.  PHYSICAL EXAMINATION: ECOG PERFORMANCE STATUS: 1 - Symptomatic but completely ambulatory  Vitals:   11/03/23 1145  BP: 118/89  Pulse: 80  Resp: 18  Temp: 98.7 F (37.1 C)  SpO2: 100%   Filed Weights   11/03/23 1145  Weight: 143 lb (64.9 kg)    GENERAL:alert, no distress and comfortable SKIN: skin color, texture, turgor are normal, no rashes or significant lesions EYES: normal, conjunctiva are pink and non-injected, sclera clear OROPHARYNX:no exudate, no erythema and lips, buccal mucosa, and tongue normal   NECK: supple, thyroid normal size, non-tender, without nodularity LYMPH:  no palpable lymphadenopathy in the cervical, axillary or inguinal LUNGS: clear to auscultation and percussion with normal breathing effort HEART: regular rate & rhythm and no murmurs and no lower extremity edema ABDOMEN:abdomen soft, non-tender and normal bowel sounds Musculoskeletal:no cyanosis of digits and no clubbing  PSYCH: alert & oriented x 3 with fluent speech NEURO: no focal motor/sensory deficits  LABORATORY DATA:  I have reviewed the data as listed Lab Results  Component Value Date   WBC 4.3 04/01/2023   HGB 16.7 04/01/2023   HCT 51.1 (H) 04/01/2023   MCV 89 04/01/2023   PLT 164 04/01/2023   Recent Labs    04/01/23 0000 04/01/23 1130 04/28/23 1016  NA 141 141 142  K 4.6 4.6 4.8  CL  --  105 103  CO2  --  22 24  GLUCOSE  --  94 90  BUN  --  7 8  CREATININE  --  0.83 0.84  CALCIUM  --  10.3* 10.4*  PROT  --  8.4 8.3  ALBUMIN  --  4.3 4.4  AST  --  122* 96*  ALT  --  104* 77*  ALKPHOS  --  103 99  BILITOT  --  0.5 0.6    RADIOGRAPHIC STUDIES: I have personally reviewed the radiological images as listed and agreed with the findings in the report. No results found.

## 2023-11-03 NOTE — Progress Notes (Signed)
Met with patient during initial consult with Dr. Alena Bills. All questions answered during visit. Reviewed upcoming appts. Contact info given and instructed to call with any questions or needs. Pt verbalized understanding. Nothing further needed at this time.

## 2023-11-03 NOTE — Progress Notes (Signed)
Orders placed for PET and brain MRI to start insurance authorization process. Will follow up with pt at new pt visit today and review appts as well.

## 2023-11-03 NOTE — Addendum Note (Signed)
Addended by: Glory Buff on: 11/03/2023 10:29 AM   Modules accepted: Orders

## 2023-11-03 NOTE — Progress Notes (Signed)
Patient stated that [facility] for [facility]ness of breath and that is when they diagnosed him with pneumonia, and they did a ct scan and found a lung mass, He is no longer having shortness of breath.

## 2023-11-07 ENCOUNTER — Other Ambulatory Visit: Payer: Self-pay | Admitting: Family Medicine

## 2023-11-08 ENCOUNTER — Encounter: Payer: Self-pay | Admitting: *Deleted

## 2023-11-09 ENCOUNTER — Telehealth: Payer: Self-pay

## 2023-11-09 ENCOUNTER — Encounter: Payer: Self-pay | Admitting: Pulmonary Disease

## 2023-11-09 ENCOUNTER — Ambulatory Visit
Admission: RE | Admit: 2023-11-09 | Discharge: 2023-11-09 | Disposition: A | Discharge status: Home / Self Care | Payer: 59 | Source: Ambulatory Visit | Attending: Internal Medicine | Admitting: Internal Medicine

## 2023-11-09 ENCOUNTER — Telehealth: Payer: Self-pay | Admitting: *Deleted

## 2023-11-09 ENCOUNTER — Ambulatory Visit (INDEPENDENT_AMBULATORY_CARE_PROVIDER_SITE_OTHER): Payer: 59 | Admitting: Pulmonary Disease

## 2023-11-09 VITALS — BP 120/80 | HR 91 | Temp 97.1°F | Ht 66.0 in | Wt 136.6 lb

## 2023-11-09 DIAGNOSIS — I700 Atherosclerosis of aorta: Secondary | ICD-10-CM | POA: Diagnosis not present

## 2023-11-09 DIAGNOSIS — R918 Other nonspecific abnormal finding of lung field: Secondary | ICD-10-CM | POA: Diagnosis not present

## 2023-11-09 DIAGNOSIS — J320 Chronic maxillary sinusitis: Secondary | ICD-10-CM | POA: Diagnosis not present

## 2023-11-09 DIAGNOSIS — R911 Solitary pulmonary nodule: Secondary | ICD-10-CM | POA: Diagnosis not present

## 2023-11-09 LAB — GLUCOSE, CAPILLARY: Glucose-Capillary: 76 mg/dL (ref 70–99)

## 2023-11-09 MED ORDER — FLUDEOXYGLUCOSE F - 18 (FDG) INJECTION
7.4000 | Freq: Once | INTRAVENOUS | Status: AC | PRN
  Administered 2023-11-09: 7.94 via INTRAVENOUS

## 2023-11-09 NOTE — Telephone Encounter (Signed)
Bronchoscopy is scheduled for 2/6 at 1:15pm.   I have notified the patient and mailed him the paperwork.  Synetta Fail please obtain prior authorization.Thank you!

## 2023-11-09 NOTE — Progress Notes (Signed)
Synopsis: Referred in by Michaelyn Barter, MD   Subjective:   PATIENT ID: Nicholas Mccoy GENDER: male DOB: 21-May-1966, MRN: [id number]  Chief Complaint  Patient presents with   Consult    Nodule. No SOB or wheezing. Dry cough.     HPI Nicholas Mccoy is a 58 year old male patient with a past medical history of heavy tobacco use presenting today to the pulmonary clinic for the evaluation of a right lung mass.   He presented initially to the ED on 01/03 with right sided chest pain. CXR at the time with patchy right upper and lower lung opacity. He was treating for pneumonia however opacity persisted and therefore his PCP ordered a Chest CT.   CT chest 11/03/2023 Right upper lobe and middle lobe lung mass with enlarged 11R.   PET scan with FDG avid RUL and RML mass with FDG avid 11R.   Family history - No family history of lung cancer.   Social history - Smokes 1 ppd for 40 years.   ROS All systems were reviewed and are negative except for the above.  Objective:   Vitals:   11/09/23 0953  BP: 120/80  Pulse: 91  Temp: (!) 97.1 F (36.2 C)  SpO2: 100%  Weight: 136 lb 9.6 oz (62 kg)  Height: 5\' 6"  (1.676 m)   100% on RA BMI Readings from Last 3 Encounters:  11/09/23 22.05 kg/m  11/03/23 23.08 kg/m  07/29/23 23.90 kg/m   Wt Readings from Last 3 Encounters:  11/09/23 136 lb 9.6 oz (62 kg)  11/03/23 143 lb (64.9 kg)  07/29/23 148 lb 1.6 oz (67.2 kg)    Physical Exam GEN: NAD HEENT: Supple Neck, Reactive Pupils, EOMI  CVS: Normal S1, Normal S2, RRR, No murmurs or ES appreciated  Lungs: Clear bilateral air entry.  Abdomen: Soft, non tender, non distended, + BS  Extremities: Warm and well perfused, No edema  Skin: No suspicious lesions appreciated  Psych: Normal Affect  Ancillary Information   CBC    Component Value Date/Time   WBC 4.3 04/01/2023 1130   WBC 5.1 10/14/2021 0126   RBC 5.76 04/01/2023 1130   RBC 5.29 10/14/2021 0126   HGB 16.7  04/01/2023 1130   HCT 51.1 (H) 04/01/2023 1130   PLT 164 04/01/2023 1130   MCV 89 04/01/2023 1130   MCV 92 09/16/2014 1058   MCH 29.0 04/01/2023 1130   MCH 30.2 10/14/2021 0126   MCHC 32.7 04/01/2023 1130   MCHC 33.3 10/14/2021 0126   RDW 12.9 04/01/2023 1130   RDW 13.7 09/16/2014 1058   LYMPHSABS 1.6 10/14/2021 0126   LYMPHSABS 2.8 03/24/2018 1531   LYMPHSABS 2.3 09/16/2014 1058   MONOABS 0.7 10/14/2021 0126   MONOABS 0.6 09/16/2014 1058   EOSABS 0.2 10/14/2021 0126   EOSABS 0.2 03/24/2018 1531   EOSABS 0.1 09/16/2014 1058   BASOSABS 0.0 10/14/2021 0126   BASOSABS 0.0 03/24/2018 1531   BASOSABS 0.0 09/16/2014 1058    Labs and imaging were reviewed.      No data to display           Assessment & Plan:  Mr. Doeden is a 58 year old male patient with a past medical history of heavy tobacco use presenting today to the pulmonary clinic for the evaluation of a right lung mass.   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   Discussed need for tissue sampling with RANB and EBUS. He  is agreable and understands risks and benefits of the procedure. Pending Brain MRI.   []  Scheduled for 02/06 for RANB and EBUS and TBNA. []  PFTs  []  Brain MRI w/wo Contrast.   Return in about 3 months (around 02/07/2024).  I spent 60 minutes caring for this patient today, including preparing to see the patient, obtaining a medical history , reviewing a separately obtained history, performing a medically appropriate examination and/or evaluation, counseling and educating the patient/family/caregiver, ordering medications, tests, or procedures, documenting clinical information in the electronic health record, and independently interpreting results (not separately reported/billed) and communicating results to the patient/family/caregiver  Janann Colonel, MD [facility] 11/09/2023 12:41 PM

## 2023-11-09 NOTE — Telephone Encounter (Signed)
Patient would like to know if he can go to work the day after his bronchoscopy. Please call and advise. [phone]

## 2023-11-09 NOTE — Telephone Encounter (Signed)
Per Dr. Larinda Buttery, if he feels ok he can return to work on Friday.  I have notified the patient.

## 2023-11-09 NOTE — Telephone Encounter (Signed)
Brain MRI scheduled for 1/31 has been denied by insurance without cancer diagnosis. MRI appt has been cancelled and will be rescheduled once diagnosis is established and authorization received from insurance. Called pt and made aware of denial without diagnosis and that MRI has been cancelled. Pt aware that will be called with brain MRI appt once rescheduled. Pt verbalized understanding.

## 2023-11-10 NOTE — Telephone Encounter (Signed)
Noted. Nothing further needed.

## 2023-11-10 NOTE — Telephone Encounter (Signed)
For the codes 16109, [id number], [id number], [id number], [id number], [id number] Prior Auth Not Required Refer # [id number]

## 2023-11-11 ENCOUNTER — Ambulatory Visit: Payer: 59

## 2023-11-15 ENCOUNTER — Encounter (HOSPITAL_COMMUNITY): Payer: Self-pay | Admitting: Pulmonary Disease

## 2023-11-15 ENCOUNTER — Other Ambulatory Visit: Payer: Self-pay

## 2023-11-15 NOTE — Progress Notes (Signed)
 PCP - Lauraine Buoy, DO Cardiologist - none Oncology - Dr Genevia Rous  Chest x-ray - 11/03/23 EKG - 10/25/23 CE-Requested Stress Test - n/a ECHO - n/a Cardiac Cath - n/a  ICD Pacemaker/Loop - n/a  Sleep Study -  n/a  Diabetes - n/a  Aspirin and Blood Thinner Instructions:  n/a  NPO   Anesthesia review: Yes  STOP now taking any Aspirin (unless otherwise instructed by your surgeon), Aleve, Naproxen, Ibuprofen , Motrin , Advil , Goody's, BC's, all herbal medications, fish oil, and all vitamins.   Coronavirus Screening Do you have any of the following symptoms:  Cough occasional  Fever (>100.98F)  yes/no: No Runny nose yes/no: No Sore throat yes/no: No Difficulty breathing/shortness of breath  yes/no: No  Have you traveled in the last 14 days and where? yes/no: No  Patient verbalized understanding of instructions that were given via phone.

## 2023-11-17 ENCOUNTER — Other Ambulatory Visit: Payer: Self-pay

## 2023-11-17 ENCOUNTER — Ambulatory Visit (HOSPITAL_COMMUNITY): Payer: 59

## 2023-11-17 ENCOUNTER — Ambulatory Visit (HOSPITAL_BASED_OUTPATIENT_CLINIC_OR_DEPARTMENT_OTHER): Payer: Self-pay | Admitting: Physician Assistant

## 2023-11-17 ENCOUNTER — Ambulatory Visit (HOSPITAL_COMMUNITY)
Admission: RE | Admit: 2023-11-17 | Discharge: 2023-11-17 | Disposition: A | Discharge status: Home / Self Care | Payer: 59 | Attending: Pulmonary Disease | Admitting: Pulmonary Disease

## 2023-11-17 ENCOUNTER — Ambulatory Visit (HOSPITAL_COMMUNITY): Payer: Self-pay | Admitting: Physician Assistant

## 2023-11-17 ENCOUNTER — Encounter (HOSPITAL_COMMUNITY)
Admission: RE | Disposition: A | Discharge status: Home / Self Care | Payer: Self-pay | Source: Home / Self Care | Attending: Pulmonary Disease

## 2023-11-17 ENCOUNTER — Encounter (HOSPITAL_COMMUNITY): Payer: Self-pay | Admitting: Pulmonary Disease

## 2023-11-17 DIAGNOSIS — R918 Other nonspecific abnormal finding of lung field: Secondary | ICD-10-CM

## 2023-11-17 DIAGNOSIS — F17200 Nicotine dependence, unspecified, uncomplicated: Secondary | ICD-10-CM | POA: Insufficient documentation

## 2023-11-17 DIAGNOSIS — C3411 Malignant neoplasm of upper lobe, right bronchus or lung: Secondary | ICD-10-CM | POA: Diagnosis not present

## 2023-11-17 HISTORY — PX: BRONCHIAL NEEDLE ASPIRATION BIOPSY: SHX5106

## 2023-11-17 HISTORY — PX: BRONCHIAL WASHINGS: SHX5105

## 2023-11-17 HISTORY — DX: Pneumonia, unspecified organism: J18.9

## 2023-11-17 HISTORY — PX: BRONCHIAL BIOPSY: SHX5109

## 2023-11-17 HISTORY — PX: ENDOBRONCHIAL ULTRASOUND: SHX5096

## 2023-11-17 HISTORY — PX: HEMOSTASIS CONTROL: SHX6838

## 2023-11-17 HISTORY — PX: BRONCHIAL BRUSHINGS: SHX5108

## 2023-11-17 HISTORY — PX: VIDEO BRONCHOSCOPY WITH RADIAL ENDOBRONCHIAL ULTRASOUND: SHX6849

## 2023-11-17 HISTORY — PX: FINE NEEDLE ASPIRATION: SHX5430

## 2023-11-17 SURGERY — BRONCHOSCOPY, WITH BIOPSY USING ELECTROMAGNETIC NAVIGATION
Anesthesia: General | Laterality: Bilateral

## 2023-11-17 MED ORDER — PHENYLEPHRINE HCL-NACL 20-0.9 MG/250ML-% IV SOLN
INTRAVENOUS | Status: DC | PRN
  Administered 2023-11-17: 35 ug/min via INTRAVENOUS

## 2023-11-17 MED ORDER — ROCURONIUM BROMIDE 10 MG/ML (PF) SYRINGE
PREFILLED_SYRINGE | INTRAVENOUS | Status: DC | PRN
  Administered 2023-11-17: 70 mg via INTRAVENOUS

## 2023-11-17 MED ORDER — PROPOFOL 10 MG/ML IV BOLUS
INTRAVENOUS | Status: DC | PRN
  Administered 2023-11-17: 150 mg via INTRAVENOUS

## 2023-11-17 MED ORDER — DEXAMETHASONE SODIUM PHOSPHATE 10 MG/ML IJ SOLN
INTRAMUSCULAR | Status: DC | PRN
  Administered 2023-11-17: 10 mg via INTRAVENOUS

## 2023-11-17 MED ORDER — SODIUM CHLORIDE 0.9 % IV SOLN: INTRAVENOUS | Status: DC

## 2023-11-17 MED ORDER — ACETAMINOPHEN 160 MG/5ML PO SOLN: 1000.0000 mg | Freq: Once | ORAL | Status: DC | PRN

## 2023-11-17 MED ORDER — LIDOCAINE 2% (20 MG/ML) 5 ML SYRINGE
INTRAMUSCULAR | Status: DC | PRN
  Administered 2023-11-17: 60 mg via INTRAVENOUS

## 2023-11-17 MED ORDER — ONDANSETRON HCL 4 MG/2ML IJ SOLN
INTRAMUSCULAR | Status: DC | PRN
  Administered 2023-11-17: 4 mg via INTRAVENOUS

## 2023-11-17 MED ORDER — ACETAMINOPHEN 500 MG PO TABS
1000.0000 mg | ORAL_TABLET | Freq: Once | ORAL | Status: DC | PRN
Start: 2023-11-17 — End: 2023-11-17

## 2023-11-17 MED ORDER — FENTANYL CITRATE (PF) 100 MCG/2ML IJ SOLN
25.0000 ug | INTRAMUSCULAR | Status: DC | PRN
Start: 2023-11-17 — End: 2023-11-17

## 2023-11-17 MED ORDER — PROPOFOL 500 MG/50ML IV EMUL
INTRAVENOUS | Status: DC | PRN
  Administered 2023-11-17: 100 ug/kg/min via INTRAVENOUS

## 2023-11-17 NOTE — Op Note (Signed)
 Video Bronchoscopy with Robotic Assisted Bronchoscopic Navigation   Date of Operation: 11/17/2023   Pre-op Diagnosis: RUL and RML Mass   Post-op Diagnosis: RUL and RML Mass   Surgeon: Shelsy Seng  Anesthesia: General endotracheal anesthesia  Operation: Flexible video fiberoptic bronchoscopy with robotic assistance and biopsies.  Estimated Blood Loss: Minimal  Complications: None  Indications and History: Nicholas Mccoy is a 58 y.o. male with history of RUL Mass and RML Mass. The risks, benefits, complications, treatment options and expected outcomes were discussed with the patient.  The possibilities of pneumothorax, pneumonia, reaction to medication, pulmonary aspiration, perforation of a viscus, bleeding, failure to diagnose a condition and creating a complication requiring transfusion or operation were discussed with the patient who freely signed the consent.    Description of Procedure: The patient was seen in the Preoperative Area, was examined and was deemed appropriate to proceed.  The patient was taken to [facility] endoscopy room 3, identified as Nicholas Mccoy and the procedure verified as Flexible Video Fiberoptic Bronchoscopy.  A Time Out was held and the above information confirmed.   Prior to the date of the procedure a high-resolution CT scan of the chest was performed. Utilizing ION software program a virtual tracheobronchial tree was generated to allow the creation of distinct navigation pathways to the patient's parenchymal abnormalities. After being taken to the operating room general anesthesia was initiated and the patient  was orally intubated. The video fiberoptic bronchoscope was introduced via the endotracheal tube and a general inspection was performed which showed normal right and left lung anatomy, aspiration of the bilateral mainstems was completed to remove any remaining secretions. Robotic catheter inserted into patient's endotracheal tube.   Target #1 RUL  Mass: The distinct navigation pathways prepared prior to this procedure were then utilized to navigate to patient's lesion identified on CT scan. The robotic catheter was secured into place and the vision probe was withdrawn.  Lesion location was approximated using fluoroscopy and radial endobronchial ultrasound for peripheral targeting. Under fluoroscopic guidance transbronchial needle brushings, transbronchial needle biopsies, and transbronchial forceps biopsies were performed to be sent for cytology and pathology. A bronchioalveolar lavage was performed in the RUL and sent for cytology.  The ion scope was withdrawn and EBUS scope was introduced with station 7,11rs and 11ri (RML mass) biopsied.   At the end of the procedure a general airway inspection was performed and there was no evidence of active bleeding. The bronchoscope was removed.  The patient tolerated the procedure well. There was no significant blood loss and there were no obvious complications. A post-procedural chest x-ray is pending.  Samples Target #1: 1. Transbronchial needle brushings from RUL Mass 2. Transbronchial needle biopsies from RUL Mass 3. Transbronchial forceps biopsies from RUL Mass 4. Bronchoalveolar lavage from RUL Mass  EBUS-TBNA was done at station 7,11rs and 11ri(RML Mass) and sent for cytology.  Plans:  The patient will be discharged from the PACU to home when recovered from anesthesia and after chest x-ray is reviewed. We will review the cytology, pathology and microbiology results with the patient when they become available. Outpatient followup will be with Oncology.  Darrin Barn, MD [facility] 11/17/2023 2:30 PM

## 2023-11-17 NOTE — Progress Notes (Signed)
 Chest x-ray reviewed by MD Assaker. Per MD Assaker, okay to discharge pt. Pt to be discharged to home with family member.  Kraig Peru, RN 11/17/23 3:15 PM

## 2023-11-17 NOTE — Transfer of Care (Signed)
 Immediate Anesthesia Transfer of Care Note  Patient: Nicholas Mccoy  Procedure(s) Performed: ROBOTIC ASSISTED NAVIGATIONAL BRONCHOSCOPY (Bilateral) ENDOBRONCHIAL ULTRASOUND (Bilateral) BRONCHIAL NEEDLE ASPIRATION BIOPSIES BRONCHIAL BIOPSIES VIDEO BRONCHOSCOPY WITH RADIAL ENDOBRONCHIAL ULTRASOUND BRONCHIAL WASHINGS BRONCHIAL BRUSHINGS FINE NEEDLE ASPIRATION (FNA) LINEAR HEMOSTASIS CONTROL  Patient Location: PACU  Anesthesia Type:General  Level of Consciousness: awake, alert , and oriented  Airway & Oxygen Therapy: Patient Spontanous Breathing  Post-op Assessment: Report given to RN and Post -op Vital signs reviewed and stable  Post vital signs: Reviewed and stable  Last Vitals:  Vitals Value Taken Time  BP 126/103   Temp    Pulse 101   Resp 20   SpO2 97     Last Pain:  Vitals:   11/17/23 1058  PainSc: 0-No pain      Patients Stated Pain Goal: 0 (11/17/23 1058)  Complications: No notable events documented.

## 2023-11-17 NOTE — Anesthesia Preprocedure Evaluation (Signed)
 Anesthesia Evaluation  Patient identified by MRN, date of birth, ID band Patient awake    Reviewed: Allergy & Precautions, NPO status , Patient's Chart, lab work & pertinent test results  History of Anesthesia Complications Negative for: history of anesthetic complications  Airway Mallampati: I       Dental  (+) Poor Dentition, Chipped, Missing, Dental Advisory Given,    Pulmonary neg sleep apnea, neg COPD, neg recent URI, Current Smoker and Patient abstained from smoking.  Lung mass   breath sounds clear to auscultation       Cardiovascular hypertension, Pt. on medications (-) angina (-) Past MI and (-) CHF  Rhythm:Regular     Neuro/Psych negative neurological ROS  negative psych ROS   GI/Hepatic negative GI ROS,,,(+) Hepatitis -, C  Endo/Other  negative endocrine ROS    Renal/GU negative Renal ROSLab Results      Component                Value               Date                      NA                       142                 04/28/2023                K                        4.8                 04/28/2023                CO2                      24                  04/28/2023                GLUCOSE                  90                  04/28/2023                BUN                      8                   04/28/2023                CREATININE               0.84                04/28/2023                CALCIUM                   10.4 (H)            04/28/2023                EGFR                     102  04/28/2023                GFRNONAA                 >60                 10/14/2021                Musculoskeletal negative musculoskeletal ROS (+)    Abdominal   Peds  Hematology negative hematology ROS (+) Lab Results      Component                Value               Date                      WBC                      4.3                 04/01/2023                HGB                      16.7                 04/01/2023                HCT                      51.1 (H)            04/01/2023                MCV                      89                  04/01/2023                PLT                      164                 04/01/2023              Anesthesia Other Findings   Reproductive/Obstetrics                             Anesthesia Physical Anesthesia Plan  ASA: 2  Anesthesia Plan: General   Post-op Pain Management: Minimal or no pain anticipated   Induction: Intravenous  PONV Risk Score and Plan: 1 and Ondansetron  and Dexamethasone   Airway Management Planned: Oral ETT  Additional Equipment: None  Intra-op Plan:   Post-operative Plan: Extubation in OR  Informed Consent: I have reviewed the patients History and Physical, chart, labs and discussed the procedure including the risks, benefits and alternatives for the proposed anesthesia with the patient or authorized representative who has indicated his/her understanding and acceptance.     Dental advisory given  Plan Discussed with: CRNA  Anesthesia Plan Comments:        Anesthesia Quick Evaluation

## 2023-11-17 NOTE — Interval H&P Note (Signed)
 Patient here for RANB. Adequate for procedure.   Annitta Kindler, MD [facility] 11/17/2023 12:57 PM

## 2023-11-18 ENCOUNTER — Encounter (HOSPITAL_COMMUNITY): Payer: Self-pay | Admitting: Pulmonary Disease

## 2023-11-18 NOTE — Anesthesia Postprocedure Evaluation (Signed)
 Anesthesia Post Note  Patient: Nicholas Mccoy  Procedure(s) Performed: ROBOTIC ASSISTED NAVIGATIONAL BRONCHOSCOPY (Bilateral) ENDOBRONCHIAL ULTRASOUND (Bilateral) BRONCHIAL NEEDLE ASPIRATION BIOPSIES BRONCHIAL BIOPSIES VIDEO BRONCHOSCOPY WITH RADIAL ENDOBRONCHIAL ULTRASOUND BRONCHIAL WASHINGS BRONCHIAL BRUSHINGS FINE NEEDLE ASPIRATION (FNA) LINEAR HEMOSTASIS CONTROL     Patient location during evaluation: PACU Anesthesia Type: General Level of consciousness: awake and alert Pain management: pain level controlled Vital Signs Assessment: post-procedure vital signs reviewed and stable Respiratory status: spontaneous breathing, nonlabored ventilation and respiratory function stable Cardiovascular status: blood pressure returned to baseline and stable Postop Assessment: no apparent nausea or vomiting Anesthetic complications: no   No notable events documented.  Last Vitals:  Vitals:   11/17/23 1440 11/17/23 1450  BP: 104/73 107/76  Pulse: 98 96  Resp: 18 17  Temp:    SpO2: 93% 94%    Last Pain:  Vitals:   11/17/23 1450  TempSrc:   PainSc: 0-No pain                 Rosalene Wardrop

## 2023-11-21 LAB — CYTOLOGY - NON PAP

## 2023-11-24 ENCOUNTER — Encounter: Payer: Self-pay | Admitting: *Deleted

## 2023-11-24 ENCOUNTER — Encounter: Payer: Self-pay | Admitting: Internal Medicine

## 2023-11-24 ENCOUNTER — Inpatient Hospital Stay: Payer: 59 | Attending: Internal Medicine | Admitting: Internal Medicine

## 2023-11-24 VITALS — BP 112/87 | HR 80 | Temp 98.6°F | Resp 18 | Wt 136.0 lb

## 2023-11-24 DIAGNOSIS — C3491 Malignant neoplasm of unspecified part of right bronchus or lung: Secondary | ICD-10-CM

## 2023-11-24 DIAGNOSIS — C3411 Malignant neoplasm of upper lobe, right bronchus or lung: Secondary | ICD-10-CM | POA: Diagnosis present

## 2023-11-24 DIAGNOSIS — J320 Chronic maxillary sinusitis: Secondary | ICD-10-CM | POA: Diagnosis not present

## 2023-11-24 DIAGNOSIS — Z79899 Other long term (current) drug therapy: Secondary | ICD-10-CM | POA: Diagnosis not present

## 2023-11-24 DIAGNOSIS — I10 Essential (primary) hypertension: Secondary | ICD-10-CM | POA: Diagnosis not present

## 2023-11-24 DIAGNOSIS — B1920 Unspecified viral hepatitis C without hepatic coma: Secondary | ICD-10-CM | POA: Diagnosis not present

## 2023-11-24 DIAGNOSIS — Z8619 Personal history of other infectious and parasitic diseases: Secondary | ICD-10-CM | POA: Diagnosis not present

## 2023-11-24 DIAGNOSIS — I700 Atherosclerosis of aorta: Secondary | ICD-10-CM | POA: Insufficient documentation

## 2023-11-24 DIAGNOSIS — Z832 Family history of diseases of the blood and blood-forming organs and certain disorders involving the immune mechanism: Secondary | ICD-10-CM | POA: Insufficient documentation

## 2023-11-24 DIAGNOSIS — F17210 Nicotine dependence, cigarettes, uncomplicated: Secondary | ICD-10-CM | POA: Diagnosis not present

## 2023-11-24 DIAGNOSIS — C3490 Malignant neoplasm of unspecified part of unspecified bronchus or lung: Secondary | ICD-10-CM | POA: Insufficient documentation

## 2023-11-24 DIAGNOSIS — J439 Emphysema, unspecified: Secondary | ICD-10-CM | POA: Insufficient documentation

## 2023-11-24 DIAGNOSIS — E785 Hyperlipidemia, unspecified: Secondary | ICD-10-CM | POA: Diagnosis not present

## 2023-11-24 DIAGNOSIS — Z8249 Family history of ischemic heart disease and other diseases of the circulatory system: Secondary | ICD-10-CM | POA: Diagnosis not present

## 2023-11-24 MED ORDER — PROCHLORPERAZINE MALEATE 10 MG PO TABS: 10.0000 mg | ORAL_TABLET | Freq: Four times a day (QID) | ORAL | 1 refills | Status: DC | PRN

## 2023-11-24 MED ORDER — DEXAMETHASONE 4 MG PO TABS: ORAL_TABLET | ORAL | 1 refills | Status: DC

## 2023-11-24 MED ORDER — ONDANSETRON HCL 8 MG PO TABS
8.0000 mg | ORAL_TABLET | Freq: Three times a day (TID) | ORAL | 1 refills | Status: DC | PRN
Start: 2023-11-24 — End: 2024-03-20

## 2023-11-24 MED ORDER — LIDOCAINE-PRILOCAINE 2.5-2.5 % EX CREA: TOPICAL_CREAM | CUTANEOUS | 3 refills | Status: DC

## 2023-11-24 NOTE — Progress Notes (Signed)
Order placed for [employer] xT, xR, and PDL1 on recent lung biopsy sample ([id number]). Will complete financial assistance application at next visit.

## 2023-11-24 NOTE — Progress Notes (Signed)
[facility] CONSULT NOTE  Patient Care Team: Pardue, Monico Blitz, DO as PCP - General (Family Medicine) Glory Buff, RN as Oncology Nurse Navigator  REFERRING PROVIDER: Dr. Payton Mccallum  REASON FOR REFFERAL: lung mass  CANCER STAGING   Cancer Staging  Lung cancer [facility]) Staging form: Lung, AJCC V9 - Clinical: Stage IIIA (cT4, cN1, cM0) - Signed by Michaelyn Barter, MD on 11/24/2023   ASSESSMENT & PLAN:  Nicholas Mccoy 58 y.o. male with pmh of alcohol use, hepatitis C, hyperlipidemia, hypertension referred to medical oncology for right lung mass.  # Right lung adenocarcinoma, stage IIIa - Patient presented to [facility] ED on 10/25/2023 with complaints of chest pain.  CTA chest showed large enhancing mass in the right lung abutting the pleura appears predominantly located in the posterior aspect of right middle lobe measures 7 x 6.4 x 7 cm.  Right inferior hilar nodule measures 2.7 cm suspicious for nodal involvement.    -Reviewed PET CT scan with the patient.  Showed 7.3 x 4.9 cm hypermetabolic large right lung mass SUV 23.1.  Prominent right hilar mass or lymph node with SUV 12.1 measures 3.4 x 2.9 cm.  No metastatic disease outside of the chest.  -Status post EBUS with biopsy showed lung adenocarcinoma from right lung upper lobe mass FNA and brushing.  Lymph node station 7, 11R superior and inferior negative for malignancy.  -Will obtain MRI brain with and without contrast to complete the staging of lung cancer. -I discussed in detail with the patient about the staging, prognosis, treatment.  He has stage IIIa and goal of treatment will be curative.  He is not interested in exploring surgical options.  Plan is concurrent chemo RT with weekly carboplatin AUC 2 and paclitaxel 45 mg/m for 6 weeks.  Side effects such as decreased blood count, increased risk of infection, anemia, renal dysfunction, neuropathy, nausea, vomiting, decreased appetite, generalized weakness was discussed.   Referral to radiation oncology.  -Scheduled for port placement -Scheduled for chemo class -Tempus testing on lung biopsy sample with PD-L1 -Will coordinate with radiation oncology about the start date for chemotherapy  # Active smoker -Discussed about smoking cessation.  Referral to smoking cessation program.  # Hepatitis C positive -Follows with Dr. Tobi Bastos.  He is not on active treatment.   Orders Placed This Encounter  Procedures   Consent Attestation for Oncology Treatment    The patient is informed of risks, benefits, side-effects of the prescribed oncology treatment. Potential short term and long term side effects and response rates discussed. After a long discussion, the patient made informed decision to proceed.:   Yes   IR IMAGING GUIDED PORT INSERTION    Standing Status:   Future    Expiration Date:   11/23/2024    Reason for Exam (SYMPTOM  OR DIAGNOSIS REQUIRED):   IV access for chemo therapy    Preferred Imaging Location?:   [facility]   Ambulatory referral to Radiation Oncology    Referral Priority:   Routine    Referral Type:   Consultation    Referral Reason:   Specialty Services Required    Requested Specialty:   Radiation Oncology    Number of Visits Requested:   1   Ambulatory referral to Social Work    Referral Priority:   Routine    Referral Type:   Consultation    Referral Reason:   Specialty Services Required    Number of Visits Requested:   1   [facility]  PHYSICIAN COMMUNICATION 1    0 Number of doses of carboplatin received at [facility] or outside facility. KAsignature of Provider. If patient has received greater than 5 doses of carboplatin, the following pre-medications should be ordered: dexamethasone, diphenhydramine, and formulary histamine H2 antagonist. If patient cannot tolerate oral histamine H2 antagonist, IV may be given.    RTC to be decided  The total time spent in the appointment was 60 minutes encounter with patients including review of  chart and various tests results, discussions about plan of care and coordination of care plan   All questions were answered. The patient knows to call the clinic with any problems, questions or concerns. No barriers to learning was detected.  Michaelyn Barter, MD 2/13/202511:31 AM   HISTORY OF PRESENTING ILLNESS:  Nicholas Mccoy 58 y.o. male with pmh of alcohol use, hepatitis C, hyperlipidemia, hypertension referred to medical oncology for right lung mass.  Patient presented to [facility] ED on 10/25/2023 with complaints of chest pain.  CTA chest showed large enhancing mass in the right lung abutting the pleura appears predominantly located in the posterior aspect of right middle lobe measures 7 x 6.4 x 7 cm.  Right inferior hilar nodule measures 2.7 cm suspicious for nodal involvement.  Encases and severely narrows the right middle lobe bronchus with mass effect and displacement of right superior pulmonary vein.  Additional borderline enlarged right superior hilar lymph node 1 cm.  Patient is an active smoker for several years.  Smokes half to 1 pack a day.  Interval history Patient seen today as follow-up after pet imaging and EBUS results. He has been feeling well overall.  Shortness of breath is improved.  Continues to work in [employer].  Denies any new concerns today.  I have reviewed his chart and materials related to his cancer extensively and collaborated history with the patient. Summary of oncologic history is as follows: Oncology History  Lung cancer (HCC)  11/09/2023 PET scan   FINDINGS: Mediastinal blood pool activity: SUV max 1.6   Liver activity: SUV max NA   NECK: No significant abnormal hypermetabolic activity in this region.   Incidental CT findings: Mild chronic left maxillary sinusitis.   CHEST: Large right lung mass primarily centered in the right middle lobe although based on recent CT this is likely to be crossing the minor fissure into the right upper lobe and  potentially crossing the major fissure into the right lower lobe, maximum SUV 23.1, the associated hypermetabolic activity measures about 7.3 by 4.9 cm. The mass abuts the right pleural margin laterally without pleural effusion or separate pleural hypermetabolic activity.   There is also a prominent right hilar mass or lymph node with maximum SUV 12.1 measuring about 3.4 by 2.9 cm.   Postobstructive atelectasis in the right middle lobe.   Incidental CT findings: Scattered paraseptal bulla at the lung apices.   ABDOMEN/PELVIS: No significant abnormal hypermetabolic activity in this region.   Incidental CT findings: Small benign calcified lesion of the left posteroinferior spleen. Atherosclerosis is present, including aortoiliac atherosclerotic disease.   SKELETON: No significant abnormal hypermetabolic activity in this region.   Incidental CT findings: None.   IMPRESSION: 1. Large right lung mass primarily centered in the right middle lobe although based on recent CT this is likely crossing the minor fissure into the right upper lobe and potentially crossing the major fissure into the right lower lobe. 2. Prominent right hilar mass or lymph node with maximum SUV 12.1 measuring about 3.4  by 2.9 cm. 3. No findings of distant metastatic disease. 4. Mild chronic left maxillary sinusitis. 5. Scattered paraseptal bulla at the lung apices. 6. Aortic atherosclerosis.   11/17/2023 Procedure   S/p EBUS with biopsy with Dr. Larinda Buttery.   FINAL MICROSCOPIC DIAGNOSIS:  A.  RIGHT LUNG, UPPER LOBE, MASS, FINE NEEDLE ASPIRATION  BIOPSY:  - Malignant  - Adenocarcinoma   B.  RIGHT LUNG, UPPER LOBE, MASS, BRUSHING:  - Malignant  - Adenocarcinoma   Specimen Submitted:  C. LUNG, RUL, LAVAGE:    FINAL MICROSCOPIC DIAGNOSIS:  - Atypical cells present  - Rare atypical large cells suspicious for tumor   FINAL MICROSCOPIC DIAGNOSIS:  D. LYMPH NODE, STATION 7, FINE NEEDLE ASPIRATION:  -  Negative for malignancy  - Compatible with benign lymph node   E. LYMPH NODE, STATION 11R SUPERIOR, FINE NEEDLE ASPIRATION:  - Negative for malignancy  - Compatible with a benign lymph node   F. LYMPH NODE, STATION 11R INFERIOR, FINE NEEDLE ASPIRATION:  - Negative for malignancy  - Compatible with a benign lymph node      11/24/2023 Cancer Staging   Staging form: Lung, AJCC V9 - Clinical: Stage IIIA (cT4, cN1, cM0) - Signed by Michaelyn Barter, MD on 11/24/2023     MEDICAL HISTORY:  Past Medical History:  Diagnosis Date   Alcohol abuse    Hepatitis C    Hyperlipidemia    Hypertension    Pneumonia    x several    SURGICAL HISTORY: Past Surgical History:  Procedure Laterality Date   BRONCHIAL BIOPSY  11/17/2023   Procedure: BRONCHIAL BIOPSIES;  Surgeon: Janann Colonel, MD;  Location: [facility] ENDOSCOPY;  Service: Pulmonary;;   BRONCHIAL BRUSHINGS  11/17/2023   Procedure: BRONCHIAL BRUSHINGS;  Surgeon: Janann Colonel, MD;  Location: [facility] ENDOSCOPY;  Service: Pulmonary;;   BRONCHIAL NEEDLE ASPIRATION BIOPSY  11/17/2023   Procedure: BRONCHIAL NEEDLE ASPIRATION BIOPSIES;  Surgeon: Janann Colonel, MD;  Location: [facility] ENDOSCOPY;  Service: Pulmonary;;   BRONCHIAL WASHINGS  11/17/2023   Procedure: BRONCHIAL WASHINGS;  Surgeon: Janann Colonel, MD;  Location: [facility] ENDOSCOPY;  Service: Pulmonary;;   COLONOSCOPY WITH PROPOFOL N/A 06/28/2023   Procedure: COLONOSCOPY WITH PROPOFOL;  Surgeon: Wyline Mood, MD;  Location: [facility] ENDOSCOPY;  Service: Gastroenterology;  Laterality: N/A;   ENDOBRONCHIAL ULTRASOUND Bilateral 11/17/2023   Procedure: ENDOBRONCHIAL ULTRASOUND;  Surgeon: Janann Colonel, MD;  Location: [facility] ENDOSCOPY;  Service: Pulmonary;  Laterality: Bilateral;   FINE NEEDLE ASPIRATION  11/17/2023   Procedure: FINE NEEDLE ASPIRATION (FNA) LINEAR;  Surgeon: Janann Colonel, MD;  Location: [facility] ENDOSCOPY;  Service: Pulmonary;;   HEMOSTASIS CONTROL  11/17/2023   Procedure:  HEMOSTASIS CONTROL;  Surgeon: Janann Colonel, MD;  Location: [facility] ENDOSCOPY;  Service: Pulmonary;;   POLYPECTOMY  06/28/2023   Procedure: POLYPECTOMY;  Surgeon: Wyline Mood, MD;  Location: [facility] ENDOSCOPY;  Service: Gastroenterology;;   VIDEO BRONCHOSCOPY WITH RADIAL ENDOBRONCHIAL ULTRASOUND  11/17/2023   Procedure: VIDEO BRONCHOSCOPY WITH RADIAL ENDOBRONCHIAL ULTRASOUND;  Surgeon: Janann Colonel, MD;  Location: [facility] ENDOSCOPY;  Service: Pulmonary;;    SOCIAL HISTORY: Social History   Socioeconomic History   Marital status: Divorced    Spouse name: Not on file   Number of children: Not on file   Years of education: Not on file   Highest education level: 10th grade  Occupational History   Not on file  Tobacco Use   Smoking status: Every Day    Current packs/day: 0.50    Average packs/day: 0.5 packs/day for 25.0 years (12.5 ttl  pk-yrs)    Types: Cigarettes   Smokeless tobacco: Never   Tobacco comments:    Started smoking at 58 years old.    Smoked 1 PPD at his heaviest.    Now smokes 3 cigarettes. Khj 11/09/2023  Vaping Use   Vaping status: Never Used  Substance and Sexual Activity   Alcohol use: Not Currently    Comment: stopped drinking 11/2017   Drug use: No   Sexual activity: Yes  Other Topics Concern   Not on file  Social History Narrative   Not on file   Social Drivers of Health   Financial Resource Strain: Low Risk  (07/25/2023)   Overall Financial Resource Strain (CARDIA)    Difficulty of Paying Living Expenses: Not very hard  Food Insecurity: No Food Insecurity (11/03/2023)   Hunger Vital Sign    Worried About Running Out of Food in the Last Year: Never true    Ran Out of Food in the Last Year: Never true  Transportation Needs: No Transportation Needs (07/25/2023)   PRAPARE - Administrator, Civil Service (Medical): No    Lack of Transportation (Non-Medical): No  Physical Activity: Insufficiently Active (07/25/2023)   Exercise Vital Sign     Days of Exercise per Week: 5 days    Minutes of Exercise per Session: 10 min  Stress: No Stress Concern Present (07/25/2023)   [employer] of Occupational Health - Occupational Stress Questionnaire    Feeling of Stress : Not at all  Social Connections: Moderately Isolated (07/25/2023)   Social Connection and Isolation Panel [NHANES]    Frequency of Communication with Friends and Family: Three times a week    Frequency of Social Gatherings with Friends and Family: Once a week    Attends Religious Services: More than 4 times per year    Active Member of [employer] or Organizations: No    Attends Engineer, structural: Not on file    Marital Status: Divorced  Intimate Partner Violence: Not At Risk (11/03/2023)   Humiliation, Afraid, Rape, and Kick questionnaire    Fear of Current or Ex-Partner: No    Emotionally Abused: No    Physically Abused: No    Sexually Abused: No    FAMILY HISTORY: Family History  Problem Relation Age of Onset   Hypertension Father    Heart disease Father    Heart attack Father    Heart attack Sister    Hypertension Brother    Healthy Brother    Sickle cell anemia Sister    Hypertension Mother    Hypertension Maternal Grandmother    Hypertension Maternal Grandfather    Hypertension Paternal Grandmother    Hypertension Paternal Grandfather     ALLERGIES:  has no known allergies.  MEDICATIONS:  Current Outpatient Medications  Medication Sig Dispense Refill   amLODipine (NORVASC) 5 MG tablet TAKE 1 TABLET (5 MG TOTAL) BY MOUTH DAILY. 90 tablet 0   rosuvastatin (CRESTOR) 5 MG tablet TAKE 1 TABLET (5 MG TOTAL) BY MOUTH DAILY. (Patient taking differently: Take 5 mg by mouth at bedtime.) 90 tablet 1   dexamethasone (DECADRON) 4 MG tablet Take 2 tablets daily for 2 days, start the day after chemotherapy. Take with food. 30 tablet 1   lidocaine-prilocaine (EMLA) cream Apply to affected area once 30 g 3   ondansetron (ZOFRAN) 8 MG tablet Take 1 tablet  (8 mg total) by mouth every 8 (eight) hours as needed for nausea or vomiting. Start on the third  day after chemotherapy. 30 tablet 1   prochlorperazine (COMPAZINE) 10 MG tablet Take 1 tablet (10 mg total) by mouth every 6 (six) hours as needed for nausea or vomiting. 30 tablet 1   No current facility-administered medications for this visit.    REVIEW OF SYSTEMS:   Pertinent information mentioned in HPI All other systems were reviewed with the patient and are negative.  PHYSICAL EXAMINATION: ECOG PERFORMANCE STATUS: 1 - Symptomatic but completely ambulatory  Vitals:   11/24/23 1016  BP: 112/87  Pulse: 80  Resp: 18  Temp: 98.6 F (37 C)  SpO2: 100%   Filed Weights   11/24/23 1016  Weight: 136 lb (61.7 kg)    GENERAL:alert, no distress and comfortable SKIN: skin color, texture, turgor are normal, no rashes or significant lesions EYES: normal, conjunctiva are pink and non-injected, sclera clear OROPHARYNX:no exudate, no erythema and lips, buccal mucosa, and tongue normal  NECK: supple, thyroid normal size, non-tender, without nodularity LYMPH:  no palpable lymphadenopathy in the cervical, axillary or inguinal LUNGS: clear to auscultation and percussion with normal breathing effort HEART: regular rate & rhythm and no murmurs and no lower extremity edema ABDOMEN:abdomen soft, non-tender and normal bowel sounds Musculoskeletal:no cyanosis of digits and no clubbing  PSYCH: alert & oriented x 3 with fluent speech NEURO: no focal motor/sensory deficits  LABORATORY DATA:  I have reviewed the data as listed Lab Results  Component Value Date   WBC 4.3 04/01/2023   HGB 16.7 04/01/2023   HCT 51.1 (H) 04/01/2023   MCV 89 04/01/2023   PLT 164 04/01/2023   Recent Labs    04/01/23 0000 04/01/23 1130 04/28/23 1016  NA 141 141 142  K 4.6 4.6 4.8  CL  --  105 103  CO2  --  22 24  GLUCOSE  --  94 90  BUN  --  7 8  CREATININE  --  0.83 0.84  CALCIUM  --  10.3* 10.4*  PROT  --   8.4 8.3  ALBUMIN  --  4.3 4.4  AST  --  122* 96*  ALT  --  104* 77*  ALKPHOS  --  103 99  BILITOT  --  0.5 0.6    RADIOGRAPHIC STUDIES: I have personally reviewed the radiological images as listed and agreed with the findings in the report. DG CHEST PORT 1 VIEW Result Date: 11/17/2023 CLINICAL DATA:  Status post bronchoscopy. EXAM: PORTABLE CHEST 1 VIEW COMPARISON:  CT chest 10/25/2023. FINDINGS: Mass in the right mid lung appears grossly unchanged. There is no pneumothorax or pleural effusion. Left lung is clear. Cardiomediastinal silhouette is within normal limits. No acute fractures are seen. IMPRESSION: Mass in the right mid lung appears grossly unchanged. No pneumothorax. Electronically Signed   By: Darliss Cheney M.D.   On: 11/17/2023 15:26   DG C-ARM BRONCHOSCOPY Result Date: 11/17/2023 C-ARM BRONCHOSCOPY: Fluoroscopy was utilized by the requesting physician.  No radiographic interpretation.   NM PET Image Initial (PI) Skull Base To Thigh Result Date: 11/09/2023 CLINICAL DATA:  Initial treatment strategy for lung nodule. EXAM: NUCLEAR MEDICINE PET SKULL BASE TO THIGH TECHNIQUE: 7.9 mCi F-18 FDG was injected intravenously. Full-ring PET imaging was performed from the skull base to thigh after the radiotracer. CT data was obtained and used for attenuation correction and anatomic localization. Fasting blood glucose: 76 mg/dl COMPARISON:  CTA chest 10/25/2023 performed at [facility] FINDINGS: Mediastinal blood pool activity: SUV max 1.6 Liver activity: SUV max NA NECK: No significant  abnormal hypermetabolic activity in this region. Incidental CT findings: Mild chronic left maxillary sinusitis. CHEST: Large right lung mass primarily centered in the right middle lobe although based on recent CT this is likely to be crossing the minor fissure into the right upper lobe and potentially crossing the major fissure into the right lower lobe, maximum SUV 23.1, the associated hypermetabolic activity measures  about 7.3 by 4.9 cm. The mass abuts the right pleural margin laterally without pleural effusion or separate pleural hypermetabolic activity. There is also a prominent right hilar mass or lymph node with maximum SUV 12.1 measuring about 3.4 by 2.9 cm. Postobstructive atelectasis in the right middle lobe. Incidental CT findings: Scattered paraseptal bulla at the lung apices. ABDOMEN/PELVIS: No significant abnormal hypermetabolic activity in this region. Incidental CT findings: Small benign calcified lesion of the left posteroinferior spleen. Atherosclerosis is present, including aortoiliac atherosclerotic disease. SKELETON: No significant abnormal hypermetabolic activity in this region. Incidental CT findings: None. IMPRESSION: 1. Large right lung mass primarily centered in the right middle lobe although based on recent CT this is likely crossing the minor fissure into the right upper lobe and potentially crossing the major fissure into the right lower lobe. 2. Prominent right hilar mass or lymph node with maximum SUV 12.1 measuring about 3.4 by 2.9 cm. 3. No findings of distant metastatic disease. 4. Mild chronic left maxillary sinusitis. 5. Scattered paraseptal bulla at the lung apices. 6. Aortic atherosclerosis. Aortic Atherosclerosis (ICD10-I70.0). Electronically Signed   By: Gaylyn Rong M.D.   On: 11/09/2023 12:20

## 2023-11-24 NOTE — Research (Signed)
   Trial:  [id number]- Effectiveness of Out-of Pocket Cost Communication and Financial Navigation (CostCOM) in Cancer Patients:   Patient Nicholas Mccoy was identified by this nurse as a potential candidate for the above listed study.  This Clinical Research Nurse met with Alwyn Ren, [id number], on 11/24/23 in a manner and location that ensures patient privacy to discuss participation in the above listed research study.  Patient is Unaccompanied.  A copy of the informed consent document and separate HIPAA Authorization was provided to the patient.  Patient reads, speaks, and understands [place].   Patient was provided with the business card of this Nurse and encouraged to contact the research team with any questions.  Approximately 20 minutes were spent with the patient reviewing the informed consent documents.  Patient was provided the option of taking informed consent documents home to review and was encouraged to review at their convenience with their support network, including other care providers. Patient took the consent documents home to review. Patient wants to discuss participation with his daughter.  Research nurse will meet with patient at his next visit in the office with Dr. Alena Bills, currently being scheduled. Patient was given a clinical trials brochure with research nurse contact information. Patient encouraged to call if he has any questions before his next visit.   Chriss Driver, RN 11/24/23 11:07 AM

## 2023-11-24 NOTE — Progress Notes (Signed)
START ON PATHWAY REGIMEN - Non-Small Cell Lung     A cycle is every 7 days, concurrent with RT:     Paclitaxel      Carboplatin   **Always confirm dose/schedule in your pharmacy ordering system**  Patient Characteristics: Preoperative or Nonsurgical Candidate (Clinical Staging), Stage IIB (N2a only) or Stage III - Nonsurgical Candidate, PS = 0,1 Therapeutic Status: Preoperative or Nonsurgical Candidate (Clinical Staging) AJCC T Category: cT4 AJCC N Category: cN1 AJCC M Category: cM0 AJCC 9 Stage Grouping: IIIA Check here if patient was staged using an edition other than AJCC Staging 9th Edition: false ECOG Performance Status: 1 Intent of Therapy: Curative Intent, Discussed with Patient

## 2023-11-24 NOTE — Progress Notes (Signed)
Patient is doing well, he is not having any symptoms of any kind. He did tell me that he wasn't going to let anyone cut on him to get the cancer out because he is scared of it spreading. 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

## 2023-11-24 NOTE — Progress Notes (Signed)
Met with patient during follow up visit with Dr. Alena Bills. All questions answered during visit. Pt given resources regarding diagnosis and supportive services. Informed that will send referral to social worked to discuss financial strain related to hospital bills. Reviewed upcoming appts. Informed that will be called with his appt for radiation oncology consultation and appt for port placement. Nothing further needed at this time. Instructed pt to call with any questions or needs. Pt verbalized understanding.

## 2023-11-25 ENCOUNTER — Encounter: Payer: Self-pay | Admitting: Internal Medicine

## 2023-11-25 ENCOUNTER — Telehealth: Payer: Self-pay | Admitting: *Deleted

## 2023-11-25 NOTE — Telephone Encounter (Signed)
Pt. Wants to know why he needs the 4 prescriptions  that wa sent in to pharmacy. Not sure if he has picked it up or not

## 2023-11-26 ENCOUNTER — Other Ambulatory Visit: Payer: Self-pay

## 2023-11-28 ENCOUNTER — Inpatient Hospital Stay: Payer: 59

## 2023-11-28 ENCOUNTER — Encounter: Payer: Self-pay | Admitting: *Deleted

## 2023-11-28 NOTE — Progress Notes (Signed)
 [facility] Clinical Social Work  Initial Assessment   Nicholas Mccoy is a 58 y.o. year old male contacted by phone. Clinical Social Work was referred by medical provider for assessment of psychosocial needs.   SDOH (Social Determinants of Health) assessments performed: Yes   SDOH Screenings   Food Insecurity: No Food Insecurity (11/03/2023)  Housing: Unknown (11/03/2023)  Transportation Needs: No Transportation Needs (07/25/2023)  Utilities: Not At Risk (11/03/2023)  Alcohol Screen: Low Risk  (04/27/2023)  Recent Concern: Alcohol Screen - Medium Risk (04/01/2023)  Depression (PHQ2-9): Low Risk  (11/03/2023)  Financial Resource Strain: Low Risk  (07/25/2023)  Physical Activity: Insufficiently Active (07/25/2023)  Social Connections: Moderately Isolated (07/25/2023)  Stress: No Stress Concern Present (07/25/2023)  Tobacco Use: High Risk (11/24/2023)     Distress Screen completed: No     No data to display            Family/Social Information:  Housing Arrangement: patient lives alone Family members/support persons in your life? Patient hs three children that are very supportive. Transportation concerns: no  Employment: Working full time for [employer]. Income source: Employment Financial concerns: Yes, due to illness and/or loss of work during treatment Type of concern: Medical bills Food access concerns: no Religious or spiritual practice: Yes-Non-Denominational. Advanced directives: No Services Currently in place:  [employer]  Coping/ Adjustment to diagnosis: Patient understands treatment plan and what happens next? yes Concerns about diagnosis and/or treatment: Losing my job and/or losing income Patient reported stressors: Therapist, art and/or priorities: Family Patient enjoys fishing Current coping skills/ strengths: Active sense of humor , Capable of independent living , Manufacturing systems engineer , General fund of knowledge , Special hobby/interest , and Supportive  family/friends     SUMMARY: Current SDOH Barriers:  Financial constraints related to having to quit work due to treatment.  Clinical Social Work Clinical Goal(s):  Scientist, research (life sciences) options for unmet needs related to:  Financial Strain   Interventions: Discussed common feeling and emotions when being diagnosed with cancer, and the importance of support during treatment Informed patient of the support team roles and support services at [facility] Provided CSW contact information and encouraged patient to call with any questions or concerns Provided patient with information about CSW role and the [employer].  Made Social Security Disability referral to the [employer].   Follow Up Plan: CSW will follow-up with patient by phone  Patient verbalizes understanding of plan: Yes    Dorothey Baseman, LCSW Clinical Social Worker [facility]

## 2023-11-28 NOTE — Progress Notes (Signed)
 Phone call made to patient to review upcoming appts and obtain information to apply for financial assistance with [employer]. All questions answered. Reassurance provided regarding upcoming treatment with chemoradiation. Pt is very hesitant on treatment at this time until he further discusses with family after chemo class on Thursday. Pt states that he values quality of life and does not want endure many side effects from treatment. Encouraged pt to keep appts to learn more about his treatment and to try initial treatment at first to see how he tolerates. Pt verbalized understanding. Nothing further needed at this time.   Financial assistance application approved online with [employer] with $0 copay.

## 2023-11-30 ENCOUNTER — Ambulatory Visit: Payer: 59

## 2023-11-30 ENCOUNTER — Other Ambulatory Visit: Payer: Self-pay

## 2023-11-30 ENCOUNTER — Inpatient Hospital Stay: Payer: 59

## 2023-12-01 ENCOUNTER — Inpatient Hospital Stay: Payer: 59

## 2023-12-01 ENCOUNTER — Other Ambulatory Visit (HOSPITAL_COMMUNITY): Payer: Self-pay | Admitting: Student

## 2023-12-01 ENCOUNTER — Encounter: Payer: Self-pay | Admitting: *Deleted

## 2023-12-01 NOTE — H&P (Shared)
Chief Complaint: Patient was seen in consultation today for right lung adenocarcinoma   at the request of Agrawal,Kavita  Referring Physician(s): Agrawal,Kavita  Supervising Physician: Malachy Moan  Patient Status: [facility] - Out-pt  History of Present Illness: Nicholas Mccoy is a 58 y.o. male with history of HLD, HTN, ETOH abuse, hepatitis C and stage 3 right lung adenocarcinoma. Patient is currently followed by Dr. Candise Che with oncology. Patient initially presented to the [facility] ER 10/25/23 with c/o chest pain. CTA reported a large enhancing mass in the right lung measuring up to 7 cm. This finding was concerning for primary pulmonary malignancy. Follow up PET Scan 11/09/23 reported  a large right lung mass centered in the right middle lobe. The mass crosses the minor fissure into the right upper lobe and potentially crosses the major fissure into the right lower lobe. Patient had a bronchoscopy 11/17/23. Biopsy reported lung adenocarcinoma. Patient was subsequently referred for a port placement to assist with chemotherapy.    Past Medical History:  Diagnosis Date   Alcohol abuse    Hepatitis C    Hyperlipidemia    Hypertension    Pneumonia    x several    Past Surgical History:  Procedure Laterality Date   BRONCHIAL BIOPSY  11/17/2023   Procedure: BRONCHIAL BIOPSIES;  Surgeon: Janann Colonel, MD;  Location: [facility] ENDOSCOPY;  Service: Pulmonary;;   BRONCHIAL BRUSHINGS  11/17/2023   Procedure: BRONCHIAL BRUSHINGS;  Surgeon: Janann Colonel, MD;  Location: [facility] ENDOSCOPY;  Service: Pulmonary;;   BRONCHIAL NEEDLE ASPIRATION BIOPSY  11/17/2023   Procedure: BRONCHIAL NEEDLE ASPIRATION BIOPSIES;  Surgeon: Janann Colonel, MD;  Location: [facility] ENDOSCOPY;  Service: Pulmonary;;   BRONCHIAL WASHINGS  11/17/2023   Procedure: BRONCHIAL WASHINGS;  Surgeon: Janann Colonel, MD;  Location: [facility] ENDOSCOPY;  Service: Pulmonary;;   COLONOSCOPY WITH PROPOFOL N/A 06/28/2023   Procedure:  COLONOSCOPY WITH PROPOFOL;  Surgeon: Wyline Mood, MD;  Location: [facility] ENDOSCOPY;  Service: Gastroenterology;  Laterality: N/A;   ENDOBRONCHIAL ULTRASOUND Bilateral 11/17/2023   Procedure: ENDOBRONCHIAL ULTRASOUND;  Surgeon: Janann Colonel, MD;  Location: [facility] ENDOSCOPY;  Service: Pulmonary;  Laterality: Bilateral;   FINE NEEDLE ASPIRATION  11/17/2023   Procedure: FINE NEEDLE ASPIRATION (FNA) LINEAR;  Surgeon: Janann Colonel, MD;  Location: [facility] ENDOSCOPY;  Service: Pulmonary;;   HEMOSTASIS CONTROL  11/17/2023   Procedure: HEMOSTASIS CONTROL;  Surgeon: Janann Colonel, MD;  Location: [facility] ENDOSCOPY;  Service: Pulmonary;;   POLYPECTOMY  06/28/2023   Procedure: POLYPECTOMY;  Surgeon: Wyline Mood, MD;  Location: [facility] ENDOSCOPY;  Service: Gastroenterology;;   VIDEO BRONCHOSCOPY WITH RADIAL ENDOBRONCHIAL ULTRASOUND  11/17/2023   Procedure: VIDEO BRONCHOSCOPY WITH RADIAL ENDOBRONCHIAL ULTRASOUND;  Surgeon: Janann Colonel, MD;  Location: [facility] ENDOSCOPY;  Service: Pulmonary;;    Allergies: Patient has no known allergies.  Medications: Prior to Admission medications   Medication Sig Start Date End Date Taking? Authorizing Provider  amLODipine (NORVASC) 5 MG tablet TAKE 1 TABLET (5 MG TOTAL) BY MOUTH DAILY. 09/26/23 09/25/24  Sherlyn Hay, DO  dexamethasone (DECADRON) 4 MG tablet Take 2 tablets daily for 2 days, start the day after chemotherapy. Take with food. 11/24/23   Michaelyn Barter, MD  lidocaine-prilocaine (EMLA) cream Apply to affected area once 11/24/23   Michaelyn Barter, MD  ondansetron (ZOFRAN) 8 MG tablet Take 1 tablet (8 mg total) by mouth every 8 (eight) hours as needed for nausea or vomiting. Start on the third day after chemotherapy. 11/24/23   Michaelyn Barter, MD  prochlorperazine (COMPAZINE) 10 MG tablet Take  1 tablet (10 mg total) by mouth every 6 (six) hours as needed for nausea or vomiting. 11/24/23   Michaelyn Barter, MD  rosuvastatin (CRESTOR) 5 MG tablet TAKE 1 TABLET (5 MG  TOTAL) BY MOUTH DAILY. Patient taking differently: Take 5 mg by mouth at bedtime. 09/26/23   Sherlyn Hay, DO     Family History  Problem Relation Age of Onset   Hypertension Father    Heart disease Father    Heart attack Father    Heart attack Sister    Hypertension Brother    Healthy Brother    Sickle cell anemia Sister    Hypertension Mother    Hypertension Maternal Grandmother    Hypertension Maternal Grandfather    Hypertension Paternal Grandmother    Hypertension Paternal Grandfather     Social History   Socioeconomic History   Marital status: Divorced    Spouse name: Not on file   Number of children: Not on file   Years of education: Not on file   Highest education level: 10th grade  Occupational History   Not on file  Tobacco Use   Smoking status: Every Day    Current packs/day: 0.50    Average packs/day: 0.5 packs/day for 25.0 years (12.5 ttl pk-yrs)    Types: Cigarettes   Smokeless tobacco: Never   Tobacco comments:    Started smoking at 58 years old.    Smoked 1 PPD at his heaviest.    Now smokes 3 cigarettes. Khj 11/09/2023  Vaping Use   Vaping status: Never Used  Substance and Sexual Activity   Alcohol use: Not Currently    Comment: stopped drinking 11/2017   Drug use: No   Sexual activity: Yes  Other Topics Concern   Not on file  Social History Narrative   Not on file   Social Drivers of Health   Financial Resource Strain: Low Risk  (07/25/2023)   Overall Financial Resource Strain (CARDIA)    Difficulty of Paying Living Expenses: Not very hard  Food Insecurity: No Food Insecurity (11/03/2023)   Hunger Vital Sign    Worried About Running Out of Food in the Last Year: Never true    Ran Out of Food in the Last Year: Never true  Transportation Needs: No Transportation Needs (07/25/2023)   PRAPARE - Administrator, Civil Service (Medical): No    Lack of Transportation (Non-Medical): No  Physical Activity: Insufficiently Active  (07/25/2023)   Exercise Vital Sign    Days of Exercise per Week: 5 days    Minutes of Exercise per Session: 10 min  Stress: No Stress Concern Present (07/25/2023)   [employer] of Occupational Health - Occupational Stress Questionnaire    Feeling of Stress : Not at all  Social Connections: Moderately Isolated (07/25/2023)   Social Connection and Isolation Panel [NHANES]    Frequency of Communication with Friends and Family: Three times a week    Frequency of Social Gatherings with Friends and Family: Once a week    Attends Religious Services: More than 4 times per year    Active Member of [employer] or Organizations: No    Attends Engineer, structural: Not on file    Marital Status: Divorced     Review of Systems: A 12 point ROS discussed and pertinent positives are indicated in the HPI above.  All other systems are negative.  Review of Systems  Vital Signs: There were no vitals taken for this visit.  Physical Exam  Imaging: DG CHEST PORT 1 VIEW Result Date: 11/17/2023 CLINICAL DATA:  Status post bronchoscopy. EXAM: PORTABLE CHEST 1 VIEW COMPARISON:  CT chest 10/25/2023. FINDINGS: Mass in the right mid lung appears grossly unchanged. There is no pneumothorax or pleural effusion. Left lung is clear. Cardiomediastinal silhouette is within normal limits. No acute fractures are seen. IMPRESSION: Mass in the right mid lung appears grossly unchanged. No pneumothorax. Electronically Signed   By: Darliss Cheney M.D.   On: 11/17/2023 15:26   DG C-ARM BRONCHOSCOPY Result Date: 11/17/2023 C-ARM BRONCHOSCOPY: Fluoroscopy was utilized by the requesting physician.  No radiographic interpretation.   NM PET Image Initial (PI) Skull Base To Thigh Result Date: 11/09/2023 CLINICAL DATA:  Initial treatment strategy for lung nodule. EXAM: NUCLEAR MEDICINE PET SKULL BASE TO THIGH TECHNIQUE: 7.9 mCi F-18 FDG was injected intravenously. Full-ring PET imaging was performed from the skull base to  thigh after the radiotracer. CT data was obtained and used for attenuation correction and anatomic localization. Fasting blood glucose: 76 mg/dl COMPARISON:  CTA chest 10/25/2023 performed at [facility] FINDINGS: Mediastinal blood pool activity: SUV max 1.6 Liver activity: SUV max NA NECK: No significant abnormal hypermetabolic activity in this region. Incidental CT findings: Mild chronic left maxillary sinusitis. CHEST: Large right lung mass primarily centered in the right middle lobe although based on recent CT this is likely to be crossing the minor fissure into the right upper lobe and potentially crossing the major fissure into the right lower lobe, maximum SUV 23.1, the associated hypermetabolic activity measures about 7.3 by 4.9 cm. The mass abuts the right pleural margin laterally without pleural effusion or separate pleural hypermetabolic activity. There is also a prominent right hilar mass or lymph node with maximum SUV 12.1 measuring about 3.4 by 2.9 cm. Postobstructive atelectasis in the right middle lobe. Incidental CT findings: Scattered paraseptal bulla at the lung apices. ABDOMEN/PELVIS: No significant abnormal hypermetabolic activity in this region. Incidental CT findings: Small benign calcified lesion of the left posteroinferior spleen. Atherosclerosis is present, including aortoiliac atherosclerotic disease. SKELETON: No significant abnormal hypermetabolic activity in this region. Incidental CT findings: None. IMPRESSION: 1. Large right lung mass primarily centered in the right middle lobe although based on recent CT this is likely crossing the minor fissure into the right upper lobe and potentially crossing the major fissure into the right lower lobe. 2. Prominent right hilar mass or lymph node with maximum SUV 12.1 measuring about 3.4 by 2.9 cm. 3. No findings of distant metastatic disease. 4. Mild chronic left maxillary sinusitis. 5. Scattered paraseptal bulla at the lung apices. 6. Aortic  atherosclerosis. Aortic Atherosclerosis (ICD10-I70.0). Electronically Signed   By: Gaylyn Rong M.D.   On: 11/09/2023 12:20    Labs:  CBC: Recent Labs    04/01/23 1130  WBC 4.3  HGB 16.7  HCT 51.1*  PLT 164    COAGS: Recent Labs    04/28/23 1016  INR 1.1    BMP: Recent Labs    04/01/23 0000 04/01/23 1130 04/28/23 1016  NA 141 141 142  K 4.6 4.6 4.8  CL  --  105 103  CO2  --  22 24  GLUCOSE  --  94 90  BUN  --  7 8  CALCIUM  --  10.3* 10.4*  CREATININE  --  0.83 0.84    LIVER FUNCTION TESTS: Recent Labs    04/01/23 1130 04/28/23 1016  BILITOT 0.5 0.6  AST 122* 96*  ALT 104*  77*  ALKPHOS 103 99  PROT 8.4 8.3  ALBUMIN 4.3 4.4    TUMOR MARKERS: No results for input(s): "AFPTM", "CEA", "CA199", "CHROMGRNA" in the last 8760 hours.  Assessment and Plan:  Risks and benefits of image guided port-a-catheter placement was discussed with the patient including, but not limited to bleeding, infection, pneumothorax, or fibrin sheath development and need for additional procedures.  All of the patient's questions were answered, patient is agreeable to proceed. Consent signed and in chart.   Thank you for this interesting consult.  I greatly enjoyed meeting Nicholas Mccoy and look forward to participating in their care.  A copy of this report was sent to the requesting provider on this date.  Electronically Signed: Rosalita Levan, PA 12/01/2023, 2:00 PM   I spent a total of  30 Minutes   in face to face in clinical consultation, greater than 50% of which was counseling/coordinating care for right lung adenocarcinoma

## 2023-12-01 NOTE — Progress Notes (Signed)
 Spoke with patient over the phone to follow up after his chemo education this morning. Pt stated that it went well and was very informative. Pt stated that he is not interested in pursuing radiation treatment but will keep his appt with Dr. Rushie Chestnut before finalizing his decision. Informed pt that if he declines radiation and wants to pursue chemo only that Dr. Alena Bills recommends combination therapy with chemo+immunotherapy. Reviewed possible side effects with patient and differing treatment schedule. Informed pt that will meet with him next week after meeting with Dr. Rushie Chestnut to help coordinate appts for treatment. All questions answered during call. Instructed pt to call back with any questions or needs. Pt verbalized understanding. Nothing further needed at this time.

## 2023-12-02 ENCOUNTER — Ambulatory Visit
Admission: RE | Admit: 2023-12-02 | Discharge: 2023-12-02 | Disposition: A | Discharge status: Home / Self Care | Payer: 59 | Source: Ambulatory Visit | Attending: Internal Medicine | Admitting: Internal Medicine

## 2023-12-03 ENCOUNTER — Other Ambulatory Visit: Payer: Self-pay

## 2023-12-05 ENCOUNTER — Inpatient Hospital Stay: Payer: 59

## 2023-12-05 NOTE — Progress Notes (Signed)
 [facility] CSW Progress Note  Clinical Child psychotherapist returned patient's call.  He stated the [facility] had not contacted him yet regarding his Disability referral that was sent by this CSW on 11/28/23.  Received confirmation from Voncille Lo, Director of Disability Services, that patient would be contacted this week.  Called patient back and informed of this.  CSW also provided patient with [facility] contact information.    Dorothey Baseman, LCSW Clinical Social Worker [facility]

## 2023-12-06 ENCOUNTER — Telehealth: Payer: Self-pay

## 2023-12-06 DIAGNOSIS — C3491 Malignant neoplasm of unspecified part of right bronchus or lung: Secondary | ICD-10-CM

## 2023-12-06 NOTE — Telephone Encounter (Signed)
 [id number]- Effectiveness of Out-of Pocket Cost Communication and Financial Navigation (CostCOM) in Cancer Patients:    Research nurse called patient to see if he has had time to review the ICF forms and is interested in participating. The patient states he hasn't had time to read over the documents yet but he will this afternoon. Patient states he will call me tomorrow and let me know. Research RN informed patient she could meet with him after his radiation appointment if he is interested. Patient is in agreement with this plan.  Chriss Driver, RN 12/06/23 4:03 PM

## 2023-12-07 ENCOUNTER — Encounter: Payer: Self-pay | Admitting: *Deleted

## 2023-12-07 ENCOUNTER — Inpatient Hospital Stay (HOSPITAL_BASED_OUTPATIENT_CLINIC_OR_DEPARTMENT_OTHER): Payer: 59 | Admitting: Hospice and Palliative Medicine

## 2023-12-07 ENCOUNTER — Ambulatory Visit
Admission: RE | Admit: 2023-12-07 | Discharge: 2023-12-07 | Disposition: A | Discharge status: Home / Self Care | Payer: 59 | Source: Ambulatory Visit | Attending: Radiation Oncology | Admitting: Radiation Oncology

## 2023-12-07 VITALS — BP 125/85 | HR 94 | Temp 98.2°F | Resp 16 | Wt 129.0 lb

## 2023-12-07 DIAGNOSIS — C342 Malignant neoplasm of middle lobe, bronchus or lung: Secondary | ICD-10-CM | POA: Insufficient documentation

## 2023-12-07 DIAGNOSIS — C3491 Malignant neoplasm of unspecified part of right bronchus or lung: Secondary | ICD-10-CM

## 2023-12-07 NOTE — Progress Notes (Signed)
 Met with patient briefly prior to consultation with Dr. Rushie Chestnut. Pt appeared agitated and voiced that he was ready to leave before seeing Dr. Rushie Chestnut. Pt states that he does not want radiation treatment but is willing to pursue chemotherapy for his lung cancer treatment. Message sent to Dr. Alena Bills to make her aware and to edit treatment plan. Instructed pt to keep his appts as scheduled at this time and will call him with his appt to start chemotherapy. All questions answered during visit. Instructed pt to call with any further questions or needs. Pt verbalized understanding.

## 2023-12-07 NOTE — Progress Notes (Signed)
 Multidisciplinary Oncology Council Documentation  Nicholas Mccoy was presented by our [facility] on 12/07/2023, which included representatives from:  Palliative Care Dietitian  Physical/Occupational Therapist Nurse Navigator Genetics Social work Survivorship RN Financial Navigator Research RN   Nicholas Mccoy currently presents with history of lung cancer  We reviewed previous medical and familial history, history of present illness, and recent lab results along with all available histopathologic and imaging studies. The MOC considered available treatment options and made the following recommendations/referrals:  SW, nutrition   The MOC is a meeting of clinicians from various specialty areas who evaluate and discuss patients for whom a multidisciplinary approach is being considered. Final determinations in the plan of care are those of the provider(s).   Today's extended care, comprehensive team conference, Nicholas Mccoy was not present for the discussion and was not examined.

## 2023-12-09 ENCOUNTER — Ambulatory Visit: Admission: RE | Admit: 2023-12-09 | Payer: 59 | Source: Ambulatory Visit

## 2023-12-09 ENCOUNTER — Encounter: Payer: Self-pay | Admitting: *Deleted

## 2023-12-09 ENCOUNTER — Ambulatory Visit
Admission: RE | Admit: 2023-12-09 | Discharge: 2023-12-09 | Disposition: A | Discharge status: Home / Self Care | Payer: 59 | Source: Ambulatory Visit | Attending: Internal Medicine | Admitting: Internal Medicine

## 2023-12-09 ENCOUNTER — Inpatient Hospital Stay: Payer: 59

## 2023-12-09 ENCOUNTER — Inpatient Hospital Stay (HOSPITAL_BASED_OUTPATIENT_CLINIC_OR_DEPARTMENT_OTHER): Payer: 59 | Admitting: Internal Medicine

## 2023-12-09 ENCOUNTER — Encounter: Payer: Self-pay | Admitting: Internal Medicine

## 2023-12-09 VITALS — BP 97/84 | HR 60 | Temp 98.6°F | Resp 16 | Wt 133.0 lb

## 2023-12-09 DIAGNOSIS — R918 Other nonspecific abnormal finding of lung field: Secondary | ICD-10-CM | POA: Diagnosis present

## 2023-12-09 DIAGNOSIS — C3491 Malignant neoplasm of unspecified part of right bronchus or lung: Secondary | ICD-10-CM | POA: Diagnosis not present

## 2023-12-09 DIAGNOSIS — C3411 Malignant neoplasm of upper lobe, right bronchus or lung: Secondary | ICD-10-CM | POA: Diagnosis not present

## 2023-12-09 MED ORDER — FOLIC ACID 1 MG PO TABS: 1.0000 mg | ORAL_TABLET | Freq: Every day | ORAL | 3 refills | Status: DC

## 2023-12-09 MED ORDER — GADOBUTROL 1 MMOL/ML IV SOLN
5.0000 mL | Freq: Once | INTRAVENOUS | Status: AC | PRN
Start: 2023-12-09 — End: 2023-12-09
  Administered 2023-12-09: 5 mL via INTRAVENOUS

## 2023-12-09 MED ORDER — CYANOCOBALAMIN 1000 MCG/ML IJ SOLN
1000.0000 ug | Freq: Once | INTRAMUSCULAR | Status: AC
  Administered 2023-12-09: 1000 ug via INTRAMUSCULAR
  Filled 2023-12-09: qty 1

## 2023-12-09 NOTE — Patient Instructions (Signed)
 Please take Dexamethasone 2 tabs twice a day (a day before chemo and then for 2 days after chemotherapy)

## 2023-12-09 NOTE — Progress Notes (Signed)
 Met with patient during follow up visit with Dr. Alena Bills to discuss treatment options due to patient's concerns regarding radiation. All questions answered during visit. Pt given materials regarding chemotherapy medications if he chooses chemotherapy without radiation. Pt stated will discuss his options with his children once he leaves.   Spoke to patient over the phone after his visit this morning. Pt stated that he has changed his mind and would like to proceed with concurrent chemo/RT. Dr. Alena Bills made aware of pt's decision. Instructed pt to keep his appt for port placement on 3/4. Reviewed with him his appt to see Dr. Rushie Chestnut on 3/11 at 10:30am. Pt stated that he will bring his daughter with him for support at next visit. Nothing further needed at this time. Instructed pt to call with any questions or needs. Pt verbalized understanding.

## 2023-12-09 NOTE — Progress Notes (Signed)
 Patient is doing great, he is ready to start his treatment, which he would like to discuss with Dr. Alena Bills today.

## 2023-12-09 NOTE — Addendum Note (Signed)
 Addended byMichaelyn Barter on: 12/09/2023 11:25 AM   Modules accepted: Orders

## 2023-12-09 NOTE — Progress Notes (Addendum)
 [facility] CONSULT NOTE  Patient Care Team: Pardue, Monico Blitz, DO as PCP - General (Family Medicine) Glory Buff, RN as Oncology Nurse Navigator Carmina Miller, MD as Consulting Physician (Radiation Oncology)  Reason for visit-stage IIIa lung adenocarcinoma  CANCER STAGING   Cancer Staging  Lung cancer [facility]) Staging form: Lung, AJCC V9 - Clinical: Stage IIIA (cT4, cN1, cM0) - Signed by Michaelyn Barter, MD on 11/24/2023   ASSESSMENT & PLAN:  Alwyn Ren 58 y.o. male with pmh of alcohol use, hepatitis C, hyperlipidemia, hypertension referred to medical oncology for right lung mass.  # Right lung adenocarcinoma, stage IIIa - Patient presented to [facility] ED on 10/25/2023 with complaints of chest pain.  CTA chest showed large enhancing mass in the right lung abutting the pleura appears predominantly located in the posterior aspect of right middle lobe measures 7 x 6.4 x 7 cm.  Right inferior hilar nodule measures 2.7 cm suspicious for nodal involvement.    -PET scan showed 7.3 x 4.9 cm hypermetabolic large right lung mass SUV 23.1.  Prominent right hilar mass or lymph node with SUV 12.1 measures 3.4 x 2.9 cm.  No metastatic disease outside of the chest.  -Status post EBUS with biopsy showed lung adenocarcinoma from right lung upper lobe mass FNA and brushing.  Lymph node station 7, 11R superior and inferior negative for malignancy.  -Patient declined any surgical evaluation.  On 11/23/2023, we had discussion about treatment option with concurrent chemo RT and consolidate with immunotherapy.  Patient has since decided not to proceed with radiation because of the previous poor experience with his family member with side effects.  Had multiple discussions with him but he continues to decline.  He came for radiation appointment and then decided to leave without being seen.  -Tempus testing was reviewed with no targetable mutations.  PD-L1 5%.  -MRI brain with and without  contrast done today.  Read is pending.  -Since patient has declined radiation, I will change the treatment plan to carboplatin Alimta and Keytruda every 3 weeks for 4 cycles and then on maintenance Keytruda.  Side effects were discussed such as decreased blood count,, increased risk of infection, nausea, vomiting, allergic reactions, decreased appetite, liver dysfunction, renal dysfunction, generalized weakness or fatigue.  Will give him vitamin B12 injection today.  Sent prescription for folic acid 1 mg once daily.  Advised on dexamethasone dosing.  -He is scheduled for port placement on March 4.  Will plan to start treatment on March 6.  [facility] navigator reached out to come with her after conversation, patient has changed his mind and would like to consider concurrent chemo RT followed by durvalumab maintenance which was the plan previously.  He will be scheduled to see Dr. Aggie Cosier.  Once we have radiation plan in place, we will schedule him for chemotherapy.  # Active smoker -Discussed about smoking cessation.  Referral to smoking cessation program.  # Hepatitis C positive -Follows with Dr. Tobi Bastos.  He is not on active treatment.   Orders Placed This Encounter  Procedures   CBC with Differential (Cancer Center Only)    Standing Status:   Future    Expected Date:   12/15/2023    Expiration Date:   12/14/2024   CMP (Cancer Center only)    Standing Status:   Future    Expected Date:   12/15/2023    Expiration Date:   12/14/2024   T4    Standing  Status:   Future    Expected Date:   12/15/2023    Expiration Date:   12/14/2024   TSH    Standing Status:   Future    Expected Date:   12/15/2023    Expiration Date:   12/14/2024   RTC March 6 for MD visit, labs, cycle 1 carboplatin, Alimta and Keytruda.  The total time spent in the appointment was 30 minutes encounter with patients including review of chart and various tests results, discussions about plan of care and  coordination of care plan   All questions were answered. The patient knows to call the clinic with any problems, questions or concerns. No barriers to learning was detected.  Michaelyn Barter, MD 2/28/202510:50 AM   HISTORY OF PRESENTING ILLNESS:  CARSYN TAUBMAN 58 y.o. male with pmh of alcohol use, hepatitis C, hyperlipidemia, hypertension referred to medical oncology for right lung mass.  Patient presented to [facility] ED on 10/25/2023 with complaints of chest pain.  CTA chest showed large enhancing mass in the right lung abutting the pleura appears predominantly located in the posterior aspect of right middle lobe measures 7 x 6.4 x 7 cm.  Right inferior hilar nodule measures 2.7 cm suspicious for nodal involvement.  Encases and severely narrows the right middle lobe bronchus with mass effect and displacement of right superior pulmonary vein.  Additional borderline enlarged right superior hilar lymph node 1 cm.  Patient is an active smoker for several years.  Smokes half to 1 pack a day.  Interval history Patient seen today as follow-up to discuss about the treatment plan for lung adenocarcinoma. He is feeling well overall.  Denies any shortness of breath.  Has decided not to proceed with radiation.  Did not attend the appointment.   I have reviewed his chart and materials related to his cancer extensively and collaborated history with the patient. Summary of oncologic history is as follows: Oncology History  Lung cancer (HCC)  11/09/2023 PET scan   FINDINGS: Mediastinal blood pool activity: SUV max 1.6   Liver activity: SUV max NA   NECK: No significant abnormal hypermetabolic activity in this region.   Incidental CT findings: Mild chronic left maxillary sinusitis.   CHEST: Large right lung mass primarily centered in the right middle lobe although based on recent CT this is likely to be crossing the minor fissure into the right upper lobe and potentially crossing the major fissure  into the right lower lobe, maximum SUV 23.1, the associated hypermetabolic activity measures about 7.3 by 4.9 cm. The mass abuts the right pleural margin laterally without pleural effusion or separate pleural hypermetabolic activity.   There is also a prominent right hilar mass or lymph node with maximum SUV 12.1 measuring about 3.4 by 2.9 cm.   Postobstructive atelectasis in the right middle lobe.   Incidental CT findings: Scattered paraseptal bulla at the lung apices.   ABDOMEN/PELVIS: No significant abnormal hypermetabolic activity in this region.   Incidental CT findings: Small benign calcified lesion of the left posteroinferior spleen. Atherosclerosis is present, including aortoiliac atherosclerotic disease.   SKELETON: No significant abnormal hypermetabolic activity in this region.   Incidental CT findings: None.   IMPRESSION: 1. Large right lung mass primarily centered in the right middle lobe although based on recent CT this is likely crossing the minor fissure into the right upper lobe and potentially crossing the major fissure into the right lower lobe. 2. Prominent right hilar mass or lymph node with maximum SUV 12.1  measuring about 3.4 by 2.9 cm. 3. No findings of distant metastatic disease. 4. Mild chronic left maxillary sinusitis. 5. Scattered paraseptal bulla at the lung apices. 6. Aortic atherosclerosis.   11/17/2023 Procedure   S/p EBUS with biopsy with Dr. Larinda Buttery.   FINAL MICROSCOPIC DIAGNOSIS:  A.  RIGHT LUNG, UPPER LOBE, MASS, FINE NEEDLE ASPIRATION  BIOPSY:  - Malignant  - Adenocarcinoma   B.  RIGHT LUNG, UPPER LOBE, MASS, BRUSHING:  - Malignant  - Adenocarcinoma   Specimen Submitted:  C. LUNG, RUL, LAVAGE:    FINAL MICROSCOPIC DIAGNOSIS:  - Atypical cells present  - Rare atypical large cells suspicious for tumor   FINAL MICROSCOPIC DIAGNOSIS:  D. LYMPH NODE, STATION 7, FINE NEEDLE ASPIRATION:  - Negative for malignancy  - Compatible with  benign lymph node   E. LYMPH NODE, STATION 11R SUPERIOR, FINE NEEDLE ASPIRATION:  - Negative for malignancy  - Compatible with a benign lymph node   F. LYMPH NODE, STATION 11R INFERIOR, FINE NEEDLE ASPIRATION:  - Negative for malignancy  - Compatible with a benign lymph node      11/24/2023 Cancer Staging   Staging form: Lung, AJCC V9 - Clinical: Stage IIIA (cT4, cN1, cM0) - Signed by Michaelyn Barter, MD on 11/24/2023   12/08/2023 - 12/08/2023 Chemotherapy   Patient is on Treatment Plan : LUNG Carboplatin + Paclitaxel + XRT q7d     12/15/2023 -  Chemotherapy   Patient is on Treatment Plan : LUNG Carboplatin (5) + Pemetrexed (500) + Pembrolizumab (200) D1 q21d Induction x 4 cycles / Maintenance Pemetrexed (500) + Pembrolizumab (200) D1 q21d       MEDICAL HISTORY:  Past Medical History:  Diagnosis Date   Alcohol abuse    Hepatitis C    Hyperlipidemia    Hypertension    Pneumonia    x several    SURGICAL HISTORY: Past Surgical History:  Procedure Laterality Date   BRONCHIAL BIOPSY  11/17/2023   Procedure: BRONCHIAL BIOPSIES;  Surgeon: Janann Colonel, MD;  Location: [facility] ENDOSCOPY;  Service: Pulmonary;;   BRONCHIAL BRUSHINGS  11/17/2023   Procedure: BRONCHIAL BRUSHINGS;  Surgeon: Janann Colonel, MD;  Location: [facility] ENDOSCOPY;  Service: Pulmonary;;   BRONCHIAL NEEDLE ASPIRATION BIOPSY  11/17/2023   Procedure: BRONCHIAL NEEDLE ASPIRATION BIOPSIES;  Surgeon: Janann Colonel, MD;  Location: [facility] ENDOSCOPY;  Service: Pulmonary;;   BRONCHIAL WASHINGS  11/17/2023   Procedure: BRONCHIAL WASHINGS;  Surgeon: Janann Colonel, MD;  Location: [facility] ENDOSCOPY;  Service: Pulmonary;;   COLONOSCOPY WITH PROPOFOL N/A 06/28/2023   Procedure: COLONOSCOPY WITH PROPOFOL;  Surgeon: Wyline Mood, MD;  Location: [facility] ENDOSCOPY;  Service: Gastroenterology;  Laterality: N/A;   ENDOBRONCHIAL ULTRASOUND Bilateral 11/17/2023   Procedure: ENDOBRONCHIAL ULTRASOUND;  Surgeon: Janann Colonel, MD;   Location: [facility] ENDOSCOPY;  Service: Pulmonary;  Laterality: Bilateral;   FINE NEEDLE ASPIRATION  11/17/2023   Procedure: FINE NEEDLE ASPIRATION (FNA) LINEAR;  Surgeon: Janann Colonel, MD;  Location: [facility] ENDOSCOPY;  Service: Pulmonary;;   HEMOSTASIS CONTROL  11/17/2023   Procedure: HEMOSTASIS CONTROL;  Surgeon: Janann Colonel, MD;  Location: [facility] ENDOSCOPY;  Service: Pulmonary;;   POLYPECTOMY  06/28/2023   Procedure: POLYPECTOMY;  Surgeon: Wyline Mood, MD;  Location: [facility] ENDOSCOPY;  Service: Gastroenterology;;   VIDEO BRONCHOSCOPY WITH RADIAL ENDOBRONCHIAL ULTRASOUND  11/17/2023   Procedure: VIDEO BRONCHOSCOPY WITH RADIAL ENDOBRONCHIAL ULTRASOUND;  Surgeon: Janann Colonel, MD;  Location: [facility] ENDOSCOPY;  Service: Pulmonary;;    SOCIAL HISTORY: Social History   Socioeconomic History   Marital status: Divorced  Spouse name: Not on file   Number of children: Not on file   Years of education: Not on file   Highest education level: 10th grade  Occupational History   Not on file  Tobacco Use   Smoking status: Every Day    Current packs/day: 0.50    Average packs/day: 0.5 packs/day for 25.0 years (12.5 ttl pk-yrs)    Types: Cigarettes   Smokeless tobacco: Never   Tobacco comments:    Started smoking at 58 years old.    Smoked 1 PPD at his heaviest.    Now smokes 3 cigarettes. Khj 11/09/2023  Vaping Use   Vaping status: Never Used  Substance and Sexual Activity   Alcohol use: Not Currently    Comment: stopped drinking 11/2017   Drug use: No   Sexual activity: Yes  Other Topics Concern   Not on file  Social History Narrative   Not on file   Social Drivers of Health   Financial Resource Strain: Low Risk  (07/25/2023)   Overall Financial Resource Strain (CARDIA)    Difficulty of Paying Living Expenses: Not very hard  Food Insecurity: No Food Insecurity (11/03/2023)   Hunger Vital Sign    Worried About Running Out of Food in the Last Year: Never true    Ran Out of Food in  the Last Year: Never true  Transportation Needs: No Transportation Needs (07/25/2023)   PRAPARE - Administrator, Civil Service (Medical): No    Lack of Transportation (Non-Medical): No  Physical Activity: Insufficiently Active (07/25/2023)   Exercise Vital Sign    Days of Exercise per Week: 5 days    Minutes of Exercise per Session: 10 min  Stress: No Stress Concern Present (07/25/2023)   [employer] of Occupational Health - Occupational Stress Questionnaire    Feeling of Stress : Not at all  Social Connections: Moderately Isolated (07/25/2023)   Social Connection and Isolation Panel [NHANES]    Frequency of Communication with Friends and Family: Three times a week    Frequency of Social Gatherings with Friends and Family: Once a week    Attends Religious Services: More than 4 times per year    Active Member of [employer] or Organizations: No    Attends Engineer, structural: Not on file    Marital Status: Divorced  Intimate Partner Violence: Not At Risk (11/03/2023)   Humiliation, Afraid, Rape, and Kick questionnaire    Fear of Current or Ex-Partner: No    Emotionally Abused: No    Physically Abused: No    Sexually Abused: No    FAMILY HISTORY: Family History  Problem Relation Age of Onset   Hypertension Father    Heart disease Father    Heart attack Father    Heart attack Sister    Hypertension Brother    Healthy Brother    Sickle cell anemia Sister    Hypertension Mother    Hypertension Maternal Grandmother    Hypertension Maternal Grandfather    Hypertension Paternal Grandmother    Hypertension Paternal Grandfather     ALLERGIES:  has no known allergies.  MEDICATIONS:  Current Outpatient Medications  Medication Sig Dispense Refill   amLODipine (NORVASC) 5 MG tablet TAKE 1 TABLET (5 MG TOTAL) BY MOUTH DAILY. 90 tablet 0   dexamethasone (DECADRON) 4 MG tablet Take 2 tablets daily for 2 days, start the day after chemotherapy. Take with food. 30  tablet 1   lidocaine-prilocaine (EMLA) cream Apply to affected  area once 30 g 3   ondansetron (ZOFRAN) 8 MG tablet Take 1 tablet (8 mg total) by mouth every 8 (eight) hours as needed for nausea or vomiting. Start on the third day after chemotherapy. 30 tablet 1   prochlorperazine (COMPAZINE) 10 MG tablet Take 1 tablet (10 mg total) by mouth every 6 (six) hours as needed for nausea or vomiting. 30 tablet 1   rosuvastatin (CRESTOR) 5 MG tablet TAKE 1 TABLET (5 MG TOTAL) BY MOUTH DAILY. (Patient taking differently: Take 5 mg by mouth at bedtime.) 90 tablet 1   folic acid (FOLVITE) 1 MG tablet Take 1 tablet (1 mg total) by mouth daily. Start 7 days before pemetrexed chemotherapy. Continue until 21 days after pemetrexed completed. 100 tablet 3   No current facility-administered medications for this visit.    REVIEW OF SYSTEMS:   Pertinent information mentioned in HPI All other systems were reviewed with the patient and are negative.  PHYSICAL EXAMINATION: ECOG PERFORMANCE STATUS: 1 - Symptomatic but completely ambulatory  Vitals:   12/09/23 1017  BP: 97/84  Pulse: 60  Resp: 16  Temp: 98.6 F (37 C)  SpO2: 99%    Filed Weights   12/09/23 1017  Weight: 133 lb (60.3 kg)     GENERAL:alert, no distress and comfortable SKIN: skin color, texture, turgor are normal, no rashes or significant lesions EYES: normal, conjunctiva are pink and non-injected, sclera clear OROPHARYNX:no exudate, no erythema and lips, buccal mucosa, and tongue normal  NECK: supple, thyroid normal size, non-tender, without nodularity LYMPH:  no palpable lymphadenopathy in the cervical, axillary or inguinal LUNGS: clear to auscultation and percussion with normal breathing effort HEART: regular rate & rhythm and no murmurs and no lower extremity edema ABDOMEN:abdomen soft, non-tender and normal bowel sounds Musculoskeletal:no cyanosis of digits and no clubbing  PSYCH: alert & oriented x 3 with fluent speech NEURO:  no focal motor/sensory deficits  LABORATORY DATA:  I have reviewed the data as listed Lab Results  Component Value Date   WBC 4.3 04/01/2023   HGB 16.7 04/01/2023   HCT 51.1 (H) 04/01/2023   MCV 89 04/01/2023   PLT 164 04/01/2023   Recent Labs    04/01/23 0000 04/01/23 1130 04/28/23 1016  NA 141 141 142  K 4.6 4.6 4.8  CL  --  105 103  CO2  --  22 24  GLUCOSE  --  94 90  BUN  --  7 8  CREATININE  --  0.83 0.84  CALCIUM  --  10.3* 10.4*  PROT  --  8.4 8.3  ALBUMIN  --  4.3 4.4  AST  --  122* 96*  ALT  --  104* 77*  ALKPHOS  --  103 99  BILITOT  --  0.5 0.6    RADIOGRAPHIC STUDIES: I have personally reviewed the radiological images as listed and agreed with the findings in the report. DG CHEST PORT 1 VIEW Result Date: 11/17/2023 CLINICAL DATA:  Status post bronchoscopy. EXAM: PORTABLE CHEST 1 VIEW COMPARISON:  CT chest 10/25/2023. FINDINGS: Mass in the right mid lung appears grossly unchanged. There is no pneumothorax or pleural effusion. Left lung is clear. Cardiomediastinal silhouette is within normal limits. No acute fractures are seen. IMPRESSION: Mass in the right mid lung appears grossly unchanged. No pneumothorax. Electronically Signed   By: Darliss Cheney M.D.   On: 11/17/2023 15:26   DG C-ARM BRONCHOSCOPY Result Date: 11/17/2023 C-ARM BRONCHOSCOPY: Fluoroscopy was utilized by the requesting physician.  No radiographic interpretation.

## 2023-12-10 ENCOUNTER — Other Ambulatory Visit: Payer: Self-pay

## 2023-12-12 ENCOUNTER — Other Ambulatory Visit (HOSPITAL_COMMUNITY): Payer: Self-pay | Admitting: Student

## 2023-12-12 ENCOUNTER — Encounter: Payer: Self-pay | Admitting: Internal Medicine

## 2023-12-12 NOTE — Progress Notes (Signed)
 Patient for IR Port Insertion on Tues 12/13/23, I called and spoke with the patient on the phone and gave pre-procedure instructions. Pt was made aware to be here at 9a, NPO after MN prior to procedure as well as driver post procedure/recovery/discharge. Pt stated understanding.  Called 12/12/23

## 2023-12-13 ENCOUNTER — Encounter: Payer: Self-pay | Admitting: Radiology

## 2023-12-13 ENCOUNTER — Ambulatory Visit
Admission: RE | Admit: 2023-12-13 | Discharge: 2023-12-13 | Disposition: A | Discharge status: Home / Self Care | Payer: 59 | Source: Ambulatory Visit | Attending: Internal Medicine | Admitting: Internal Medicine

## 2023-12-13 ENCOUNTER — Other Ambulatory Visit: Payer: Self-pay

## 2023-12-13 DIAGNOSIS — F17210 Nicotine dependence, cigarettes, uncomplicated: Secondary | ICD-10-CM | POA: Insufficient documentation

## 2023-12-13 DIAGNOSIS — I10 Essential (primary) hypertension: Secondary | ICD-10-CM | POA: Diagnosis not present

## 2023-12-13 DIAGNOSIS — C3491 Malignant neoplasm of unspecified part of right bronchus or lung: Secondary | ICD-10-CM | POA: Diagnosis present

## 2023-12-13 DIAGNOSIS — B1920 Unspecified viral hepatitis C without hepatic coma: Secondary | ICD-10-CM | POA: Diagnosis not present

## 2023-12-13 DIAGNOSIS — E785 Hyperlipidemia, unspecified: Secondary | ICD-10-CM | POA: Diagnosis not present

## 2023-12-13 HISTORY — PX: IR IMAGING GUIDED PORT INSERTION: IMG5740

## 2023-12-13 MED ORDER — MIDAZOLAM HCL 2 MG/2ML IJ SOLN
INTRAMUSCULAR | Status: AC | PRN
  Administered 2023-12-13: 1 mg via INTRAVENOUS

## 2023-12-13 MED ORDER — MIDAZOLAM HCL 2 MG/2ML IJ SOLN
INTRAMUSCULAR | Status: AC
Start: 2023-12-13 — End: 2023-12-13
  Filled 2023-12-13: qty 2

## 2023-12-13 MED ORDER — LIDOCAINE HCL 1 % IJ SOLN
INTRAMUSCULAR | Status: AC
Start: 2023-12-13 — End: ?
  Filled 2023-12-13: qty 20

## 2023-12-13 MED ORDER — FENTANYL CITRATE (PF) 100 MCG/2ML IJ SOLN
INTRAMUSCULAR | Status: AC | PRN
  Administered 2023-12-13: 50 ug via INTRAVENOUS

## 2023-12-13 MED ORDER — HEPARIN SOD (PORK) LOCK FLUSH 100 UNIT/ML IV SOLN
INTRAVENOUS | Status: AC
  Filled 2023-12-13: qty 5

## 2023-12-13 MED ORDER — HEPARIN SOD (PORK) LOCK FLUSH 100 UNIT/ML IV SOLN
500.0000 [IU] | Freq: Once | INTRAVENOUS | Status: AC
Start: 2023-12-13 — End: 2023-12-13
  Administered 2023-12-13: 500 [IU] via INTRAVENOUS

## 2023-12-13 MED ORDER — LIDOCAINE HCL 1 % IJ SOLN
20.0000 mL | Freq: Once | INTRAMUSCULAR | Status: AC
  Administered 2023-12-13: 13 mL via INTRADERMAL

## 2023-12-13 MED ORDER — FENTANYL CITRATE (PF) 100 MCG/2ML IJ SOLN
INTRAMUSCULAR | Status: AC
  Filled 2023-12-13: qty 2

## 2023-12-13 NOTE — H&P (Signed)
 Chief Complaint: right lung adenocarcinoma; chemotherapy - image guided port a catheter placement   Referring Provider(s): [place], NWGNFA   Supervising Physician: Irish Lack  Patient Status: [facility] - Out-pt  History of Present Illness: Nicholas Mccoy is a 58 y.o. male with a history of alcohol abuse, smoking, hepatitis C currently on active treatment, hyperlipidemia, and hypertension.  Pt is followed by Dr. Alena Bills for right lung adenocarcinoma that initially presented with chest pain 10/25/23 sending Mr. Heo to the [facility] ED.  A CTA was obtained during this visit revealing a large enhancing mass in the right lung abutting the pleura.  He was subsequently referred to Dr. Alena Bills for further investigation; he underwent a EBUS with biopsy 11/17/23 which conformed lung adenocarcinoma and chemotherapy initiation has been planned.  He was referred to interventional radiology for image guided port a catheter placement.     Patient is Full Code  Past Medical History:  Diagnosis Date   Alcohol abuse    Hepatitis C    Hyperlipidemia    Hypertension    Pneumonia    x several    Past Surgical History:  Procedure Laterality Date   BRONCHIAL BIOPSY  11/17/2023   Procedure: BRONCHIAL BIOPSIES;  Surgeon: Janann Colonel, MD;  Location: [facility] ENDOSCOPY;  Service: Pulmonary;;   BRONCHIAL BRUSHINGS  11/17/2023   Procedure: BRONCHIAL BRUSHINGS;  Surgeon: Janann Colonel, MD;  Location: [facility] ENDOSCOPY;  Service: Pulmonary;;   BRONCHIAL NEEDLE ASPIRATION BIOPSY  11/17/2023   Procedure: BRONCHIAL NEEDLE ASPIRATION BIOPSIES;  Surgeon: Janann Colonel, MD;  Location: [facility] ENDOSCOPY;  Service: Pulmonary;;   BRONCHIAL WASHINGS  11/17/2023   Procedure: BRONCHIAL WASHINGS;  Surgeon: Janann Colonel, MD;  Location: [facility] ENDOSCOPY;  Service: Pulmonary;;   COLONOSCOPY WITH PROPOFOL N/A 06/28/2023   Procedure: COLONOSCOPY WITH PROPOFOL;  Surgeon: Wyline Mood, MD;  Location: [facility] ENDOSCOPY;   Service: Gastroenterology;  Laterality: N/A;   ENDOBRONCHIAL ULTRASOUND Bilateral 11/17/2023   Procedure: ENDOBRONCHIAL ULTRASOUND;  Surgeon: Janann Colonel, MD;  Location: [facility] ENDOSCOPY;  Service: Pulmonary;  Laterality: Bilateral;   FINE NEEDLE ASPIRATION  11/17/2023   Procedure: FINE NEEDLE ASPIRATION (FNA) LINEAR;  Surgeon: Janann Colonel, MD;  Location: [facility] ENDOSCOPY;  Service: Pulmonary;;   HEMOSTASIS CONTROL  11/17/2023   Procedure: HEMOSTASIS CONTROL;  Surgeon: Janann Colonel, MD;  Location: [facility] ENDOSCOPY;  Service: Pulmonary;;   POLYPECTOMY  06/28/2023   Procedure: POLYPECTOMY;  Surgeon: Wyline Mood, MD;  Location: [facility] ENDOSCOPY;  Service: Gastroenterology;;   VIDEO BRONCHOSCOPY WITH RADIAL ENDOBRONCHIAL ULTRASOUND  11/17/2023   Procedure: VIDEO BRONCHOSCOPY WITH RADIAL ENDOBRONCHIAL ULTRASOUND;  Surgeon: Janann Colonel, MD;  Location: [facility] ENDOSCOPY;  Service: Pulmonary;;    Allergies: Patient has no known allergies.  Medications: Prior to Admission medications   Medication Sig Start Date End Date Taking? Authorizing Provider  amLODipine (NORVASC) 5 MG tablet TAKE 1 TABLET (5 MG TOTAL) BY MOUTH DAILY. 09/26/23 09/25/24  Sherlyn Hay, DO  dexamethasone (DECADRON) 4 MG tablet Take 2 tablets daily for 2 days, start the day after chemotherapy. Take with food. 11/24/23   Michaelyn Barter, MD  lidocaine-prilocaine (EMLA) cream Apply to affected area once 11/24/23   Michaelyn Barter, MD  ondansetron (ZOFRAN) 8 MG tablet Take 1 tablet (8 mg total) by mouth every 8 (eight) hours as needed for nausea or vomiting. Start on the third day after chemotherapy. 11/24/23   Michaelyn Barter, MD  prochlorperazine (COMPAZINE) 10 MG tablet Take 1 tablet (10 mg total) by mouth every 6 (six) hours as  needed for nausea or vomiting. 11/24/23   Michaelyn Barter, MD  rosuvastatin (CRESTOR) 5 MG tablet TAKE 1 TABLET (5 MG TOTAL) BY MOUTH DAILY. Patient taking differently: Take 5 mg by mouth at bedtime.  09/26/23   Sherlyn Hay, DO     Family History  Problem Relation Age of Onset   Hypertension Father    Heart disease Father    Heart attack Father    Heart attack Sister    Hypertension Brother    Healthy Brother    Sickle cell anemia Sister    Hypertension Mother    Hypertension Maternal Grandmother    Hypertension Maternal Grandfather    Hypertension Paternal Grandmother    Hypertension Paternal Grandfather     Social History   Socioeconomic History   Marital status: Divorced    Spouse name: Not on file   Number of children: Not on file   Years of education: Not on file   Highest education level: 10th grade  Occupational History   Not on file  Tobacco Use   Smoking status: Every Day    Current packs/day: 0.50    Average packs/day: 0.5 packs/day for 25.0 years (12.5 ttl pk-yrs)    Types: Cigarettes   Smokeless tobacco: Never   Tobacco comments:    Started smoking at 58 years old.    Smoked 1 PPD at his heaviest.    Now smokes 3 cigarettes. Khj 11/09/2023  Vaping Use   Vaping status: Never Used  Substance and Sexual Activity   Alcohol use: Not Currently    Comment: stopped drinking 11/2017   Drug use: No   Sexual activity: Yes  Other Topics Concern   Not on file  Social History Narrative   Not on file   Social Drivers of Health   Financial Resource Strain: Low Risk  (07/25/2023)   Overall Financial Resource Strain (CARDIA)    Difficulty of Paying Living Expenses: Not very hard  Food Insecurity: No Food Insecurity (11/03/2023)   Hunger Vital Sign    Worried About Running Out of Food in the Last Year: Never true    Ran Out of Food in the Last Year: Never true  Transportation Needs: No Transportation Needs (07/25/2023)   PRAPARE - Administrator, Civil Service (Medical): No    Lack of Transportation (Non-Medical): No  Physical Activity: Insufficiently Active (07/25/2023)   Exercise Vital Sign    Days of Exercise per Week: 5 days    Minutes of  Exercise per Session: 10 min  Stress: No Stress Concern Present (07/25/2023)   [employer] of Occupational Health - Occupational Stress Questionnaire    Feeling of Stress : Not at all  Social Connections: Moderately Isolated (07/25/2023)   Social Connection and Isolation Panel [NHANES]    Frequency of Communication with Friends and Family: Three times a week    Frequency of Social Gatherings with Friends and Family: Once a week    Attends Religious Services: More than 4 times per year    Active Member of [employer] or Organizations: No    Attends Engineer, structural: Not on file    Marital Status: Divorced     Review of Systems: A 12 point ROS discussed and pertinent positives are indicated in the HPI above.  All other systems are negative.  Review of Systems  Constitutional:  Negative for fatigue and fever.  HENT:  Negative for congestion and dental problem.   Respiratory:  Negative for cough, chest  tightness, shortness of breath and wheezing.   Cardiovascular:  Negative for chest pain.  Gastrointestinal:  Negative for diarrhea, nausea and vomiting.  Neurological:  Negative for light-headedness and headaches.  Psychiatric/Behavioral:  Negative for agitation, behavioral problems and confusion.     Vital Signs: There were no vitals taken for this visit.  Advance Care Plan: The advanced care place/surrogate decision maker was discussed at the time of visit and the patient did not wish to discuss or was not able to name a surrogate decision maker or provide an advance care plan.  Physical Exam Constitutional:      Appearance: Normal appearance.  HENT:     Head: Normocephalic and atraumatic.     Mouth/Throat:     Mouth: Mucous membranes are moist.  Cardiovascular:     Rate and Rhythm: Normal rate and regular rhythm.  Pulmonary:     Effort: Pulmonary effort is normal.     Breath sounds: Normal breath sounds.  Abdominal:     General: Bowel sounds are normal.      Palpations: Abdomen is soft.  Musculoskeletal:        General: Normal range of motion.     Cervical back: Normal range of motion.  Skin:    General: Skin is warm and dry.  Neurological:     Mental Status: He is alert and oriented to person, place, and time.  Psychiatric:        Mood and Affect: Mood normal.        Behavior: Behavior normal.     Imaging: DG CHEST PORT 1 VIEW Result Date: 11/17/2023 CLINICAL DATA:  Status post bronchoscopy. EXAM: PORTABLE CHEST 1 VIEW COMPARISON:  CT chest 10/25/2023. FINDINGS: Mass in the right mid lung appears grossly unchanged. There is no pneumothorax or pleural effusion. Left lung is clear. Cardiomediastinal silhouette is within normal limits. No acute fractures are seen. IMPRESSION: Mass in the right mid lung appears grossly unchanged. No pneumothorax. Electronically Signed   By: Darliss Cheney M.D.   On: 11/17/2023 15:26   DG C-ARM BRONCHOSCOPY Result Date: 11/17/2023 C-ARM BRONCHOSCOPY: Fluoroscopy was utilized by the requesting physician.  No radiographic interpretation.    Labs:  CBC: Recent Labs    04/01/23 1130  WBC 4.3  HGB 16.7  HCT 51.1*  PLT 164    COAGS: Recent Labs    04/28/23 1016  INR 1.1    BMP: Recent Labs    04/01/23 0000 04/01/23 1130 04/28/23 1016  NA 141 141 142  K 4.6 4.6 4.8  CL  --  105 103  CO2  --  22 24  GLUCOSE  --  94 90  BUN  --  7 8  CALCIUM  --  10.3* 10.4*  CREATININE  --  0.83 0.84    LIVER FUNCTION TESTS: Recent Labs    04/01/23 1130 04/28/23 1016  BILITOT 0.5 0.6  AST 122* 96*  ALT 104* 77*  ALKPHOS 103 99  PROT 8.4 8.3  ALBUMIN 4.3 4.4    TUMOR MARKERS: No results for input(s): "AFPTM", "CEA", "CA199", "CHROMGRNA" in the last 8760 hours.  Assessment and Plan:  Pt with right lung adenocarcinoma and upcoming chemotherapy initiation scheduled for image guided port a catheter placement 12/13/23.    Risks and benefits of image guided port-a-catheter placement was discussed with  the patient including, but not limited to bleeding, infection, pneumothorax, or fibrin sheath development and need for additional procedures.  All of the patient's questions were answered, patient  is agreeable to proceed. Consent signed and in chart.   Thank you for allowing our service to participate in MARKEL KURTENBACH 's care.  Electronically Signed: Loman Brooklyn, PA-C   12/13/2023, 8:22 AM    I spent a total of  30 Minutes   in face to face in clinical consultation, greater than 50% of which was counseling/coordinating care for image guided port a catheter placement.

## 2023-12-13 NOTE — Procedures (Signed)
 Interventional Radiology Procedure Note  Procedure: Single Lumen Power Port Placement    Access:  Left IJ vein.  Findings: Catheter tip positioned at SVC/RA junction. Port is ready for immediate use.   Complications: None  EBL: < 10 mL  Recommendations:  - Ok to shower in 24 hours - Do not submerge for 7 days - Routine line care   Hartleigh Edmonston T. Fredia Sorrow, M.D Pager:  [phone]

## 2023-12-15 ENCOUNTER — Other Ambulatory Visit: Payer: Self-pay

## 2023-12-20 ENCOUNTER — Encounter: Payer: Self-pay | Admitting: Radiation Oncology

## 2023-12-20 ENCOUNTER — Encounter: Payer: Self-pay | Admitting: Internal Medicine

## 2023-12-20 ENCOUNTER — Encounter: Payer: Self-pay | Admitting: *Deleted

## 2023-12-20 ENCOUNTER — Inpatient Hospital Stay: Payer: Self-pay

## 2023-12-20 ENCOUNTER — Ambulatory Visit
Admission: RE | Admit: 2023-12-20 | Discharge: 2023-12-20 | Disposition: A | Discharge status: Home / Self Care | Payer: 59 | Source: Ambulatory Visit | Attending: Radiation Oncology | Admitting: Radiation Oncology

## 2023-12-20 VITALS — BP 105/73 | HR 89 | Temp 98.4°F | Resp 16 | Wt 131.0 lb

## 2023-12-20 DIAGNOSIS — Z8249 Family history of ischemic heart disease and other diseases of the circulatory system: Secondary | ICD-10-CM | POA: Insufficient documentation

## 2023-12-20 DIAGNOSIS — Z79899 Other long term (current) drug therapy: Secondary | ICD-10-CM | POA: Insufficient documentation

## 2023-12-20 DIAGNOSIS — F17210 Nicotine dependence, cigarettes, uncomplicated: Secondary | ICD-10-CM | POA: Insufficient documentation

## 2023-12-20 DIAGNOSIS — Z8619 Personal history of other infectious and parasitic diseases: Secondary | ICD-10-CM | POA: Insufficient documentation

## 2023-12-20 DIAGNOSIS — J320 Chronic maxillary sinusitis: Secondary | ICD-10-CM | POA: Insufficient documentation

## 2023-12-20 DIAGNOSIS — E785 Hyperlipidemia, unspecified: Secondary | ICD-10-CM | POA: Insufficient documentation

## 2023-12-20 DIAGNOSIS — C3411 Malignant neoplasm of upper lobe, right bronchus or lung: Secondary | ICD-10-CM | POA: Insufficient documentation

## 2023-12-20 DIAGNOSIS — Z7952 Long term (current) use of systemic steroids: Secondary | ICD-10-CM | POA: Insufficient documentation

## 2023-12-20 DIAGNOSIS — I10 Essential (primary) hypertension: Secondary | ICD-10-CM | POA: Insufficient documentation

## 2023-12-20 DIAGNOSIS — I700 Atherosclerosis of aorta: Secondary | ICD-10-CM | POA: Insufficient documentation

## 2023-12-20 DIAGNOSIS — C342 Malignant neoplasm of middle lobe, bronchus or lung: Secondary | ICD-10-CM | POA: Diagnosis present

## 2023-12-20 DIAGNOSIS — Z832 Family history of diseases of the blood and blood-forming organs and certain disorders involving the immune mechanism: Secondary | ICD-10-CM | POA: Insufficient documentation

## 2023-12-20 DIAGNOSIS — B1920 Unspecified viral hepatitis C without hepatic coma: Secondary | ICD-10-CM | POA: Insufficient documentation

## 2023-12-20 NOTE — Progress Notes (Signed)
 Nutrition Assessment   Reason for Assessment:   MOC referral   ASSESSMENT:  58 year old male with stage III lung adenocarcinoma.  Past medical history of etoh use, hepatitis C, HLD, HTN, smoker.  Patient starting concurrent chemotherapy and radiation.   Met with patient in clinic.  Reports that his appetite is good.  Usually eats fruit and coffee for breakfast around 8am.  4 mornings out of the week will get a biscuit.  Usually around 12:30 will have sandwich and 1/2 of fries or more. Supper is usually meat, potato, vegetable, bean. Does not eat beef or pork.  Drinks mostly water.  Denies current alcohol use.  Snacks on chips, cakes.  Denies any nutrition impact symptoms at this time.      Nutrition Focused Physical Exam:   Orbital Region: mild Buccal Region: moderate Upper Arm Region: moderate Thoracic and Lumbar Region: moderate Temple Region: moderate Clavicle Bone Region: moderate Shoulder and Acromion Bone Region: moderate Scapular Bone Region: moderate Dorsal Hand: WNL Patellar Region: mild Anterior Thigh Region: unable to assess Posterior Calf Region: mild Edema (RD assessment): none Hair: assessed Eyes: assessed Mouth: assessed Skin: dry Nails: long   Medications: compazine, zofran, dexamethasone   Labs: reviewed   Anthropometrics:   Height: 66 inches Weight: 131 lb 4.8 oz on 3/4 UBW: 140 lb in Jan 2025  143 lb 11/03/23 BMI: 21  8% weight loss in the last 2 months, significant   Estimated Energy Needs  Kcals: 1480-1700 Protein: 74-85 g Fluid: 1480-1700 ml   NUTRITION DIAGNOSIS: Unintentional weight loss related to lung cancer as evidenced by 8% weight loss in the last 2 months, moderate fat and muscle mass loss   MALNUTRITION DIAGNOSIS: Patient meets criteria for severe malnutrition in context of acute illness as evidenced by 8% weight loss in the last 2 months, moderate fat and muscle mass loss   INTERVENTION:  Discussed strategies to  increase calories and protein.  Hight Calorie, High Protein diet handout provided Discussed 350 calorie shakes and names of shakes written down for patient.  Coupons given.   Contact information provided   MONITORING, EVALUATION, GOAL: weight trends, intake   Next Visit: to be determined with treatment  Aleshia Cartelli B. Freida Busman, RD, LDN Registered Dietitian [phone]

## 2023-12-20 NOTE — Addendum Note (Signed)
 Addended byMichaelyn Barter on: 12/20/2023 11:22 AM   Modules accepted: Orders

## 2023-12-20 NOTE — Consult Note (Signed)
 NEW PATIENT EVALUATION  Name: Nicholas Mccoy  MRN: [id number]  Date:   12/20/2023     DOB: 11-18-1965   This 58 y.o. male patient presents to the clinic for initial evaluation of stage IIIa (cT4 cN1 M0) adenocarcinoma the right lung.  REFERRING PHYSICIAN: Michaelyn Barter, MD  CHIEF COMPLAINT:  Chief Complaint  Patient presents with   Lung Cancer    DIAGNOSIS: The encounter diagnosis was Malignant neoplasm of middle lobe, bronchus or lung (HCC).   PREVIOUS INVESTIGATIONS:  PET/CT CT scans reviewed Clinical notes reviewed Pathology reports reviewed  HPI: Patient is a 58 year old male who presented with sudden onset of chest pain and difficulty breathing.  Initial workup showed a right lung mass possibly pneumonia.  CT scan confirmed a right lung mass centered in the right middle lobe likely crossing the minor fissure.  There is also prominent right hilar adenopathy.  This was confirmed on a PET CT scan showing hypermetabolic 7.3 x 4.9 cm mass which abuts the right pleural margin.  There is also prominent right hilar adenopathy hypermetabolic measuring 3.4 x 2.9 cm.  There is also postobstructive atelectasis in the right middle lobe.  Patient underwent bronchoscopy with cytology positive for adenocarcinoma.  MRI of the brain is pending.  He has been referred to medical oncology stage IIIa disease with recommendation for concurrent chemoradiation.  He is seen today for radiation oncology opinion.  He specifically denies hemoptysis chest tightness does have a cough but fairly dry.  He is having no bone pain.  PLANNED TREATMENT REGIMEN: Concurrent chemoradiation with radiation being delivered with IMRT  PAST MEDICAL HISTORY:  has a past medical history of Alcohol abuse, Hepatitis C, Hyperlipidemia, Hypertension, and Pneumonia.    PAST SURGICAL HISTORY:  Past Surgical History:  Procedure Laterality Date   BRONCHIAL BIOPSY  11/17/2023   Procedure: BRONCHIAL BIOPSIES;  Surgeon: Janann Colonel, MD;  Location: [facility] ENDOSCOPY;  Service: Pulmonary;;   BRONCHIAL BRUSHINGS  11/17/2023   Procedure: BRONCHIAL BRUSHINGS;  Surgeon: Janann Colonel, MD;  Location: [facility] ENDOSCOPY;  Service: Pulmonary;;   BRONCHIAL NEEDLE ASPIRATION BIOPSY  11/17/2023   Procedure: BRONCHIAL NEEDLE ASPIRATION BIOPSIES;  Surgeon: Janann Colonel, MD;  Location: [facility] ENDOSCOPY;  Service: Pulmonary;;   BRONCHIAL WASHINGS  11/17/2023   Procedure: BRONCHIAL WASHINGS;  Surgeon: Janann Colonel, MD;  Location: [facility] ENDOSCOPY;  Service: Pulmonary;;   COLONOSCOPY WITH PROPOFOL N/A 06/28/2023   Procedure: COLONOSCOPY WITH PROPOFOL;  Surgeon: Wyline Mood, MD;  Location: [facility] ENDOSCOPY;  Service: Gastroenterology;  Laterality: N/A;   ENDOBRONCHIAL ULTRASOUND Bilateral 11/17/2023   Procedure: ENDOBRONCHIAL ULTRASOUND;  Surgeon: Janann Colonel, MD;  Location: [facility] ENDOSCOPY;  Service: Pulmonary;  Laterality: Bilateral;   FINE NEEDLE ASPIRATION  11/17/2023   Procedure: FINE NEEDLE ASPIRATION (FNA) LINEAR;  Surgeon: Janann Colonel, MD;  Location: [facility] ENDOSCOPY;  Service: Pulmonary;;   HEMOSTASIS CONTROL  11/17/2023   Procedure: HEMOSTASIS CONTROL;  Surgeon: Janann Colonel, MD;  Location: [facility] ENDOSCOPY;  Service: Pulmonary;;   IR IMAGING GUIDED PORT INSERTION  12/13/2023   POLYPECTOMY  06/28/2023   Procedure: POLYPECTOMY;  Surgeon: Wyline Mood, MD;  Location: [facility] ENDOSCOPY;  Service: Gastroenterology;;   VIDEO BRONCHOSCOPY WITH RADIAL ENDOBRONCHIAL ULTRASOUND  11/17/2023   Procedure: VIDEO BRONCHOSCOPY WITH RADIAL ENDOBRONCHIAL ULTRASOUND;  Surgeon: Janann Colonel, MD;  Location: [facility] ENDOSCOPY;  Service: Pulmonary;;    FAMILY HISTORY: family history includes Healthy in his brother; Heart attack in his father and sister; Heart disease in his father; Hypertension in his brother, father, maternal grandfather,  maternal grandmother, mother, paternal grandfather, and paternal grandmother; Sickle cell anemia in  his sister.  SOCIAL HISTORY:  reports that he has been smoking cigarettes. He has a 12.5 pack-year smoking history. He has never used smokeless tobacco. He reports that he does not currently use alcohol. He reports that he does not use drugs.  ALLERGIES: Patient has no known allergies.  MEDICATIONS:  Current Outpatient Medications  Medication Sig Dispense Refill   amLODipine (NORVASC) 5 MG tablet TAKE 1 TABLET (5 MG TOTAL) BY MOUTH DAILY. 90 tablet 0   dexamethasone (DECADRON) 4 MG tablet Take 2 tablets daily for 2 days, start the day after chemotherapy. Take with food. 30 tablet 1   lidocaine-prilocaine (EMLA) cream Apply to affected area once 30 g 3   ondansetron (ZOFRAN) 8 MG tablet Take 1 tablet (8 mg total) by mouth every 8 (eight) hours as needed for nausea or vomiting. Start on the third day after chemotherapy. 30 tablet 1   prochlorperazine (COMPAZINE) 10 MG tablet Take 1 tablet (10 mg total) by mouth every 6 (six) hours as needed for nausea or vomiting. 30 tablet 1   rosuvastatin (CRESTOR) 5 MG tablet TAKE 1 TABLET (5 MG TOTAL) BY MOUTH DAILY. (Patient taking differently: Take 5 mg by mouth at bedtime.) 90 tablet 1   No current facility-administered medications for this encounter.    ECOG PERFORMANCE STATUS:  1 - Symptomatic but completely ambulatory  REVIEW OF SYSTEMS: Patient denies any weight loss, fatigue, weakness, fever, chills or night sweats. Patient denies any loss of vision, blurred vision. Patient denies any ringing  of the ears or hearing loss. No irregular heartbeat. Patient denies heart murmur or history of fainting. Patient denies any chest pain or pain radiating to her upper extremities. Patient denies any shortness of breath, difficulty breathing at night, cough or hemoptysis. Patient denies any swelling in the lower legs. Patient denies any nausea vomiting, vomiting of blood, or coffee ground material in the vomitus. Patient denies any stomach pain. Patient states has  had normal bowel movements no significant constipation or diarrhea. Patient denies any dysuria, hematuria or significant nocturia. Patient denies any problems walking, swelling in the joints or loss of balance. Patient denies any skin changes, loss of hair or loss of weight. Patient denies any excessive worrying or anxiety or significant depression. Patient denies any problems with insomnia. Patient denies excessive thirst, polyuria, polydipsia. Patient denies any swollen glands, patient denies easy bruising or easy bleeding. Patient denies any recent infections, allergies or URI. Patient "s visual fields have not changed significantly in recent time.   PHYSICAL EXAM: BP 105/73   Pulse 89   Temp 98.4 F (36.9 C)   Resp 16   Wt 131 lb (59.4 kg)   BMI 21.14 kg/m  Thin slightly cachectic male in NAD.  Well-developed well-nourished patient in NAD. HEENT reveals PERLA, EOMI, discs not visualized.  Oral cavity is clear. No oral mucosal lesions are identified. Neck is clear without evidence of cervical or supraclavicular adenopathy. Lungs are clear to A&P. Cardiac examination is essentially unremarkable with regular rate and rhythm without murmur rub or thrill. Abdomen is benign with no organomegaly or masses noted. Motor sensory and DTR levels are equal and symmetric in the upper and lower extremities. Cranial nerves II through XII are grossly intact. Proprioception is intact. No peripheral adenopathy or edema is identified. No motor or sensory levels are noted. Crude visual fields are within normal range.  LABORATORY DATA: Cytology reports reviewed compatible  with above-stated findings    RADIOLOGY RESULTS: CT scan and PET CT scan reviewed compatible with above-stated findings brain MRI is pending   IMPRESSION: Adley stage IIIa adenocarcinoma the right lung in 58 year old male  PLAN: At this time I recommend concurrent chemoradiation.  Will plan on delivering 70 Gray over 7 weeks with chemotherapy.   I would use IMRT based on the 3A nature of his disease.  Will treat hypermetabolic right hilar and nodes to the same dose as his primary tumor.  I would use IMRT to spare critical structures such as his heart esophagus spinal cord and normal lung volume.  Risks and benefits of treatment including increased cough possible dysphagia skin reaction fatigue alteration of blood counts all were discussed in detail with the patient.  There will be extra effort by both professional staff as well as technical staff to coordinate and manage concurrent chemoradiation and ensuing side effects during his treatments. He comprehends my treatment plan well.  I personally set up and ordered CT simulation for later this week.  I would like to take this opportunity to thank you for allowing me to participate in the care of your patient.Carmina Miller, MD

## 2023-12-21 ENCOUNTER — Ambulatory Visit
Admission: RE | Admit: 2023-12-21 | Discharge: 2023-12-21 | Disposition: A | Discharge status: Home / Self Care | Payer: Self-pay | Source: Ambulatory Visit | Attending: Radiation Oncology | Admitting: Radiation Oncology

## 2023-12-21 ENCOUNTER — Other Ambulatory Visit: Payer: Self-pay

## 2023-12-21 ENCOUNTER — Encounter: Payer: Self-pay | Admitting: *Deleted

## 2023-12-21 DIAGNOSIS — C342 Malignant neoplasm of middle lobe, bronchus or lung: Secondary | ICD-10-CM | POA: Diagnosis not present

## 2023-12-21 NOTE — Progress Notes (Signed)
 Met with patient during CT simulation. Pt did not have any questions or needs at this time. Informed that his chemo treatments will be scheduled to start the same week as radiation and he will be called with those appts. Instructed pt to call with any questions or needs. Pt verbalized understanding.

## 2023-12-26 DIAGNOSIS — C342 Malignant neoplasm of middle lobe, bronchus or lung: Secondary | ICD-10-CM | POA: Diagnosis not present

## 2023-12-29 ENCOUNTER — Ambulatory Visit
Admission: RE | Admit: 2023-12-29 | Discharge: 2023-12-29 | Disposition: A | Discharge status: Home / Self Care | Payer: Self-pay | Source: Ambulatory Visit | Attending: Radiation Oncology | Admitting: Radiation Oncology

## 2023-12-29 ENCOUNTER — Encounter: Payer: Self-pay | Admitting: Internal Medicine

## 2023-12-29 NOTE — Progress Notes (Signed)
 Pharmacist Chemotherapy Monitoring - Initial Assessment    Anticipated start date: 01/05/24   The following has been reviewed per standard work regarding the patient's treatment regimen: The patient's diagnosis, treatment plan and drug doses, and organ/hematologic function Lab orders and baseline tests specific to treatment regimen  The treatment plan start date, drug sequencing, and pre-medications Prior authorization status  Patient's documented medication list, including drug-drug interaction screen and prescriptions for anti-emetics and supportive care specific to the treatment regimen The drug concentrations, fluid compatibility, administration routes, and timing of the medications to be used The patient's access for treatment and lifetime cumulative dose history, if applicable  The patient's medication allergies and previous infusion related reactions, if applicable   Changes made to treatment plan:  N/A  Follow up needed:  Baseline labs   Ebony Hail, Pharm.D., CPP 12/29/2023@8 :58 AM

## 2024-01-02 ENCOUNTER — Ambulatory Visit
Admission: RE | Admit: 2024-01-02 | Discharge: 2024-01-02 | Disposition: A | Discharge status: Home / Self Care | Payer: Self-pay | Source: Ambulatory Visit | Attending: Radiation Oncology | Admitting: Radiation Oncology

## 2024-01-02 ENCOUNTER — Encounter: Payer: Self-pay | Admitting: Internal Medicine

## 2024-01-02 ENCOUNTER — Other Ambulatory Visit: Payer: Self-pay

## 2024-01-02 DIAGNOSIS — C342 Malignant neoplasm of middle lobe, bronchus or lung: Secondary | ICD-10-CM | POA: Diagnosis not present

## 2024-01-02 LAB — RAD ONC ARIA SESSION SUMMARY
Course Elapsed Days: 0
Plan Fractions Treated to Date: 1
Plan Prescribed Dose Per Fraction: 2 Gy
Plan Total Fractions Prescribed: 35
Plan Total Prescribed Dose: 70 Gy
Reference Point Dosage Given to Date: 2 Gy
Reference Point Session Dosage Given: 2 Gy
Session Number: 1

## 2024-01-03 ENCOUNTER — Other Ambulatory Visit: Payer: Self-pay

## 2024-01-03 ENCOUNTER — Ambulatory Visit
Admission: RE | Admit: 2024-01-03 | Discharge: 2024-01-03 | Disposition: A | Discharge status: Home / Self Care | Payer: Self-pay | Source: Ambulatory Visit | Attending: Radiation Oncology | Admitting: Radiation Oncology

## 2024-01-03 DIAGNOSIS — C342 Malignant neoplasm of middle lobe, bronchus or lung: Secondary | ICD-10-CM | POA: Diagnosis not present

## 2024-01-03 LAB — RAD ONC ARIA SESSION SUMMARY
Course Elapsed Days: 1
Plan Fractions Treated to Date: 2
Plan Prescribed Dose Per Fraction: 2 Gy
Plan Total Fractions Prescribed: 35
Plan Total Prescribed Dose: 70 Gy
Reference Point Dosage Given to Date: 4 Gy
Reference Point Session Dosage Given: 2 Gy
Session Number: 2

## 2024-01-04 ENCOUNTER — Other Ambulatory Visit: Payer: Self-pay

## 2024-01-04 ENCOUNTER — Ambulatory Visit
Admission: RE | Admit: 2024-01-04 | Discharge: 2024-01-04 | Disposition: A | Discharge status: Home / Self Care | Payer: Self-pay | Source: Ambulatory Visit | Attending: Radiation Oncology | Admitting: Radiation Oncology

## 2024-01-04 DIAGNOSIS — C342 Malignant neoplasm of middle lobe, bronchus or lung: Secondary | ICD-10-CM | POA: Diagnosis not present

## 2024-01-04 LAB — RAD ONC ARIA SESSION SUMMARY
Course Elapsed Days: 2
Plan Fractions Treated to Date: 3
Plan Prescribed Dose Per Fraction: 2 Gy
Plan Total Fractions Prescribed: 35
Plan Total Prescribed Dose: 70 Gy
Reference Point Dosage Given to Date: 6 Gy
Reference Point Session Dosage Given: 2 Gy
Session Number: 3

## 2024-01-05 ENCOUNTER — Inpatient Hospital Stay: Payer: Self-pay

## 2024-01-05 ENCOUNTER — Ambulatory Visit
Admission: RE | Admit: 2024-01-05 | Discharge: 2024-01-05 | Disposition: A | Discharge status: Home / Self Care | Source: Ambulatory Visit | Attending: Radiation Oncology | Admitting: Radiation Oncology

## 2024-01-05 ENCOUNTER — Inpatient Hospital Stay (HOSPITAL_BASED_OUTPATIENT_CLINIC_OR_DEPARTMENT_OTHER): Admitting: Nurse Practitioner

## 2024-01-05 ENCOUNTER — Inpatient Hospital Stay

## 2024-01-05 ENCOUNTER — Encounter: Payer: Self-pay | Admitting: Nurse Practitioner

## 2024-01-05 ENCOUNTER — Other Ambulatory Visit: Payer: Self-pay

## 2024-01-05 ENCOUNTER — Encounter: Payer: Self-pay | Admitting: *Deleted

## 2024-01-05 ENCOUNTER — Encounter: Payer: Self-pay | Admitting: Internal Medicine

## 2024-01-05 VITALS — BP 112/86 | HR 85 | Resp 18

## 2024-01-05 VITALS — BP 102/79 | HR 112 | Temp 96.9°F | Resp 16 | Wt 127.0 lb

## 2024-01-05 DIAGNOSIS — C3491 Malignant neoplasm of unspecified part of right bronchus or lung: Secondary | ICD-10-CM

## 2024-01-05 DIAGNOSIS — C342 Malignant neoplasm of middle lobe, bronchus or lung: Secondary | ICD-10-CM | POA: Diagnosis not present

## 2024-01-05 DIAGNOSIS — Z5111 Encounter for antineoplastic chemotherapy: Secondary | ICD-10-CM

## 2024-01-05 LAB — RAD ONC ARIA SESSION SUMMARY
Course Elapsed Days: 3
Plan Fractions Treated to Date: 4
Plan Prescribed Dose Per Fraction: 2 Gy
Plan Total Fractions Prescribed: 35
Plan Total Prescribed Dose: 70 Gy
Reference Point Dosage Given to Date: 8 Gy
Reference Point Session Dosage Given: 2 Gy
Session Number: 4

## 2024-01-05 LAB — CBC WITH DIFFERENTIAL (CANCER CENTER ONLY)
Abs Immature Granulocytes: 0.04 10*3/uL (ref 0.00–0.07)
Basophils Absolute: 0 10*3/uL (ref 0.0–0.1)
Basophils Relative: 0 %
Eosinophils Absolute: 0.1 10*3/uL (ref 0.0–0.5)
Eosinophils Relative: 1 %
HCT: 35.8 % — ABNORMAL LOW (ref 39.0–52.0)
Hemoglobin: 11.3 g/dL — ABNORMAL LOW (ref 13.0–17.0)
Immature Granulocytes: 1 %
Lymphocytes Relative: 22 %
Lymphs Abs: 1.7 10*3/uL (ref 0.7–4.0)
MCH: 25.6 pg — ABNORMAL LOW (ref 26.0–34.0)
MCHC: 31.6 g/dL (ref 30.0–36.0)
MCV: 81 fL (ref 80.0–100.0)
Monocytes Absolute: 1.1 10*3/uL — ABNORMAL HIGH (ref 0.1–1.0)
Monocytes Relative: 14 %
Neutro Abs: 4.8 10*3/uL (ref 1.7–7.7)
Neutrophils Relative %: 62 %
Platelet Count: 408 10*3/uL — ABNORMAL HIGH (ref 150–400)
RBC: 4.42 MIL/uL (ref 4.22–5.81)
RDW: 14 % (ref 11.5–15.5)
WBC Count: 7.7 10*3/uL (ref 4.0–10.5)
nRBC: 0 % (ref 0.0–0.2)

## 2024-01-05 LAB — CMP (CANCER CENTER ONLY)
ALT: 97 U/L — ABNORMAL HIGH (ref 0–44)
AST: 135 U/L — ABNORMAL HIGH (ref 15–41)
Albumin: 3 g/dL — ABNORMAL LOW (ref 3.5–5.0)
Alkaline Phosphatase: 81 U/L (ref 38–126)
Anion gap: 12 (ref 5–15)
BUN: 10 mg/dL (ref 6–20)
CO2: 24 mmol/L (ref 22–32)
Calcium: 9.3 mg/dL (ref 8.9–10.3)
Chloride: 98 mmol/L (ref 98–111)
Creatinine: 0.71 mg/dL (ref 0.61–1.24)
GFR, Estimated: >60 mL/min (ref >60)
Glucose, Bld: 109 mg/dL — ABNORMAL HIGH (ref 70–99)
Potassium: 3.8 mmol/L (ref 3.5–5.1)
Sodium: 134 mmol/L — ABNORMAL LOW (ref 135–145)
Total Bilirubin: 0.9 mg/dL (ref 0.0–1.2)
Total Protein: 8.2 g/dL — ABNORMAL HIGH (ref 6.5–8.1)

## 2024-01-05 MED ORDER — SODIUM CHLORIDE 0.9 % IV SOLN
INTRAVENOUS | Status: DC
Start: 2024-01-05 — End: 2024-01-05
  Filled 2024-01-05: qty 250

## 2024-01-05 MED ORDER — SODIUM CHLORIDE 0.9 % IV SOLN
45.0000 mg/m2 | Freq: Once | INTRAVENOUS | Status: AC
  Administered 2024-01-05: 78 mg via INTRAVENOUS
  Filled 2024-01-05: qty 13

## 2024-01-05 MED ORDER — FAMOTIDINE IN NACL 20-0.9 MG/50ML-% IV SOLN
20.0000 mg | Freq: Once | INTRAVENOUS | Status: AC
  Administered 2024-01-05: 20 mg via INTRAVENOUS
  Filled 2024-01-05: qty 50

## 2024-01-05 MED ORDER — VARENICLINE TARTRATE 0.5 MG PO TABS: ORAL_TABLET | ORAL | 0 refills | Status: DC

## 2024-01-05 MED ORDER — SODIUM CHLORIDE 0.9 % IV SOLN
223.8000 mg | Freq: Once | INTRAVENOUS | Status: AC
  Administered 2024-01-05: 220 mg via INTRAVENOUS
  Filled 2024-01-05: qty 22

## 2024-01-05 MED ORDER — DIPHENHYDRAMINE HCL 50 MG/ML IJ SOLN
50.0000 mg | Freq: Once | INTRAMUSCULAR | Status: AC
  Administered 2024-01-05: 50 mg via INTRAVENOUS
  Filled 2024-01-05: qty 1

## 2024-01-05 MED ORDER — HEPARIN SOD (PORK) LOCK FLUSH 100 UNIT/ML IV SOLN
500.0000 [IU] | Freq: Once | INTRAVENOUS | Status: DC | PRN
  Filled 2024-01-05: qty 5

## 2024-01-05 MED ORDER — PALONOSETRON HCL INJECTION 0.25 MG/5ML
0.2500 mg | Freq: Once | INTRAVENOUS | Status: AC
  Administered 2024-01-05: 0.25 mg via INTRAVENOUS
  Filled 2024-01-05: qty 5

## 2024-01-05 MED ORDER — DEXAMETHASONE SODIUM PHOSPHATE 10 MG/ML IJ SOLN
10.0000 mg | Freq: Once | INTRAMUSCULAR | Status: AC
  Administered 2024-01-05: 10 mg via INTRAVENOUS
  Filled 2024-01-05: qty 1

## 2024-01-05 NOTE — Progress Notes (Signed)
 [facility] CONSULT NOTE  Patient Care Team: Pardue, Monico Blitz, DO as PCP - General (Family Medicine) Glory Buff, RN as Oncology Nurse Navigator Carmina Miller, MD as Consulting Physician (Radiation Oncology)  Reason for visit- stage IIIa lung adenocarcinoma  CANCER STAGING   Cancer Staging  Lung cancer [facility]) Staging form: Lung, AJCC V9 - Clinical: Stage IIIA (cT4, cN1, cM0) - Signed by Michaelyn Barter, MD on 11/24/2023  ASSESSMENT & PLAN:  Nicholas Mccoy 58 y.o. male with pmh of alcohol use, hepatitis C, hyperlipidemia, hypertension referred to medical oncology for right lung mass.  # Right lung adenocarcinoma, stage IIIa - Patient presented to [facility] ED on 10/25/2023 with complaints of chest pain.  CTA chest showed large enhancing mass in the right lung abutting the pleura appears predominantly located in the posterior aspect of right middle lobe measures 7 x 6.4 x 7 cm.  Right inferior hilar nodule measures 2.7 cm suspicious for nodal involvement.    - PET scan showed 7.3 x 4.9 cm hypermetabolic large right lung mass SUV 23.1.  Prominent right hilar mass or lymph node with SUV 12.1 measures 3.4 x 2.9 cm.  No metastatic disease outside of the chest.  - Status post EBUS with biopsy showed lung adenocarcinoma from right lung upper lobe mass FNA and brushing.  Lymph node station 7, 11R superior and inferior negative for malignancy.  - Patient declined any surgical evaluation.  On 11/23/2023, concurrent chemo-radiation and consolidation with immunotherapy was discussed. He declined multiple times.   - Tempus testing was reviewed with no targetable mutations.  PD-L1 5%.  - MRI brain with and without contrast 12/09/23 was negative for intracranial metastatic disease.   - He declined radiation and we discussed carbo-alimta-keytruda every 3 weeks x 4 cycles followed by maintenance keytruda.   - He then opted to undergo chemo and radiation followed by durvalumab maintenance  which is what was initially discussed.   - Port placed for administration of chemotherapy.   - We again reviewed plan for carboplatin + paclitaxel with concurrent XRT. Reviewed risks including decreased blood counts, infections, nausea, vomiting, diarrhea, allergic reactions, weight loss, liver or renal dysfunction, weakness as well as others. He verbalizes understanding and wishes to proceed. We again reviewed use of antiemetics and dexamethasone starting tomorrow.   - He started radiation on 12/29/23.   - Labs reviewed and acceptable to initiate treatment.   # Active smoker - Discussed about smoking cessation.   - down to 3 cigarettes per day - discussed option for chantix. Reviewed action, titration of dosing, and potential side effects. He would like to trial medication. Reviewed that if he experiences side effects to reduce dose to previously tolerated dose.   # Hepatitis C positive - Follows with Dr. Tobi Bastos.  He is not on active treatment.  # Abnormal LFTs - monitor. Ok for paclitaxel.   # Port-a-cath:  - placed for chemotherapy. Functioning appropriately.  - has emla cream  # Goals of care - treatment given with curative intent  Disposition:  Carbo-paclitaxel today Rtc in 1 week for port/labs, Dr Alena Bills, +/- D1C2 carbo-taxol- la  No orders of the defined types were placed in this encounter.  The total time spent in the appointment was 40 minutes encounter with patients including review of chart and various tests results, discussions about plan of care and coordination of care plan   All questions were answered. The patient knows to call the clinic with any problems, questions or concerns.  No barriers to learning was detected.  Alinda Dooms, NP 01/05/2024   HISTORY OF PRESENTING ILLNESS:  Nicholas Mccoy 58 y.o. male with pmh of alcohol use, hepatitis C, hyperlipidemia, hypertension referred to medical oncology for right lung mass.  Patient presented to [facility] ED  on 10/25/2023 with complaints of chest pain.  CTA chest showed large enhancing mass in the right lung abutting the pleura appears predominantly located in the posterior aspect of right middle lobe measures 7 x 6.4 x 7 cm.  Right inferior hilar nodule measures 2.7 cm suspicious for nodal involvement.  Encases and severely narrows the right middle lobe bronchus with mass effect and displacement of right superior pulmonary vein.  Additional borderline enlarged right superior hilar lymph node 1 cm.  Patient is an active smoker for several years.  Smokes half to 1 pack a day.  Interval history Patient returns to clinic for consideration of initiation of chemotherapy. He has now elected to undergo concurrent chemo and radiation. Continues to feel well overall. Continues to lose weight. Denies worsening cough or shortness of breath. Tolerating radiation well. Denies pain.   Summary of oncologic history is as follows: Oncology History  Lung cancer (HCC)  11/09/2023 PET scan   FINDINGS: Mediastinal blood pool activity: SUV max 1.6   Liver activity: SUV max NA   NECK: No significant abnormal hypermetabolic activity in this region.   Incidental CT findings: Mild chronic left maxillary sinusitis.   CHEST: Large right lung mass primarily centered in the right middle lobe although based on recent CT this is likely to be crossing the minor fissure into the right upper lobe and potentially crossing the major fissure into the right lower lobe, maximum SUV 23.1, the associated hypermetabolic activity measures about 7.3 by 4.9 cm. The mass abuts the right pleural margin laterally without pleural effusion or separate pleural hypermetabolic activity.   There is also a prominent right hilar mass or lymph node with maximum SUV 12.1 measuring about 3.4 by 2.9 cm.   Postobstructive atelectasis in the right middle lobe.   Incidental CT findings: Scattered paraseptal bulla at the lung apices.    ABDOMEN/PELVIS: No significant abnormal hypermetabolic activity in this region.   Incidental CT findings: Small benign calcified lesion of the left posteroinferior spleen. Atherosclerosis is present, including aortoiliac atherosclerotic disease.   SKELETON: No significant abnormal hypermetabolic activity in this region.   Incidental CT findings: None.   IMPRESSION: 1. Large right lung mass primarily centered in the right middle lobe although based on recent CT this is likely crossing the minor fissure into the right upper lobe and potentially crossing the major fissure into the right lower lobe. 2. Prominent right hilar mass or lymph node with maximum SUV 12.1 measuring about 3.4 by 2.9 cm. 3. No findings of distant metastatic disease. 4. Mild chronic left maxillary sinusitis. 5. Scattered paraseptal bulla at the lung apices. 6. Aortic atherosclerosis.   11/17/2023 Procedure   S/p EBUS with biopsy with Dr. Larinda Buttery.   FINAL MICROSCOPIC DIAGNOSIS:  A.  RIGHT LUNG, UPPER LOBE, MASS, FINE NEEDLE ASPIRATION  BIOPSY:  - Malignant  - Adenocarcinoma   B.  RIGHT LUNG, UPPER LOBE, MASS, BRUSHING:  - Malignant  - Adenocarcinoma   Specimen Submitted:  C. LUNG, RUL, LAVAGE:    FINAL MICROSCOPIC DIAGNOSIS:  - Atypical cells present  - Rare atypical large cells suspicious for tumor   FINAL MICROSCOPIC DIAGNOSIS:  D. LYMPH NODE, STATION 7, FINE NEEDLE ASPIRATION:  - Negative  for malignancy  - Compatible with benign lymph node   E. LYMPH NODE, STATION 11R SUPERIOR, FINE NEEDLE ASPIRATION:  - Negative for malignancy  - Compatible with a benign lymph node   F. LYMPH NODE, STATION 11R INFERIOR, FINE NEEDLE ASPIRATION:  - Negative for malignancy  - Compatible with a benign lymph node      11/24/2023 Cancer Staging   Staging form: Lung, AJCC V9 - Clinical: Stage IIIA (cT4, cN1, cM0) - Signed by Michaelyn Barter, MD on 11/24/2023   12/08/2023 - 12/08/2023 Chemotherapy   Patient is  on Treatment Plan : LUNG Carboplatin + Paclitaxel + XRT q7d     12/15/2023 - 12/15/2023 Chemotherapy   Patient is on Treatment Plan : LUNG Carboplatin (5) + Pemetrexed (500) + Pembrolizumab (200) D1 q21d Induction x 4 cycles / Maintenance Pemetrexed (500) + Pembrolizumab (200) D1 q21d     01/05/2024 -  Chemotherapy   Patient is on Treatment Plan : LUNG Carboplatin + Paclitaxel + XRT q7d       MEDICAL HISTORY:  Past Medical History:  Diagnosis Date   Alcohol abuse    Hepatitis C    Hyperlipidemia    Hypertension    Pneumonia    x several    SURGICAL HISTORY: Past Surgical History:  Procedure Laterality Date   BRONCHIAL BIOPSY  11/17/2023   Procedure: BRONCHIAL BIOPSIES;  Surgeon: Janann Colonel, MD;  Location: [facility] ENDOSCOPY;  Service: Pulmonary;;   BRONCHIAL BRUSHINGS  11/17/2023   Procedure: BRONCHIAL BRUSHINGS;  Surgeon: Janann Colonel, MD;  Location: [facility] ENDOSCOPY;  Service: Pulmonary;;   BRONCHIAL NEEDLE ASPIRATION BIOPSY  11/17/2023   Procedure: BRONCHIAL NEEDLE ASPIRATION BIOPSIES;  Surgeon: Janann Colonel, MD;  Location: [facility] ENDOSCOPY;  Service: Pulmonary;;   BRONCHIAL WASHINGS  11/17/2023   Procedure: BRONCHIAL WASHINGS;  Surgeon: Janann Colonel, MD;  Location: [facility] ENDOSCOPY;  Service: Pulmonary;;   COLONOSCOPY WITH PROPOFOL N/A 06/28/2023   Procedure: COLONOSCOPY WITH PROPOFOL;  Surgeon: Wyline Mood, MD;  Location: [facility] ENDOSCOPY;  Service: Gastroenterology;  Laterality: N/A;   ENDOBRONCHIAL ULTRASOUND Bilateral 11/17/2023   Procedure: ENDOBRONCHIAL ULTRASOUND;  Surgeon: Janann Colonel, MD;  Location: [facility] ENDOSCOPY;  Service: Pulmonary;  Laterality: Bilateral;   FINE NEEDLE ASPIRATION  11/17/2023   Procedure: FINE NEEDLE ASPIRATION (FNA) LINEAR;  Surgeon: Janann Colonel, MD;  Location: [facility] ENDOSCOPY;  Service: Pulmonary;;   HEMOSTASIS CONTROL  11/17/2023   Procedure: HEMOSTASIS CONTROL;  Surgeon: Janann Colonel, MD;  Location: [facility] ENDOSCOPY;  Service:  Pulmonary;;   IR IMAGING GUIDED PORT INSERTION  12/13/2023   POLYPECTOMY  06/28/2023   Procedure: POLYPECTOMY;  Surgeon: Wyline Mood, MD;  Location: [facility] ENDOSCOPY;  Service: Gastroenterology;;   VIDEO BRONCHOSCOPY WITH RADIAL ENDOBRONCHIAL ULTRASOUND  11/17/2023   Procedure: VIDEO BRONCHOSCOPY WITH RADIAL ENDOBRONCHIAL ULTRASOUND;  Surgeon: Janann Colonel, MD;  Location: [facility] ENDOSCOPY;  Service: Pulmonary;;    SOCIAL HISTORY: Social History   Socioeconomic History   Marital status: Divorced    Spouse name: Not on file   Number of children: Not on file   Years of education: Not on file   Highest education level: 10th grade  Occupational History   Not on file  Tobacco Use   Smoking status: Every Day    Current packs/day: 0.50    Average packs/day: 0.5 packs/day for 25.0 years (12.5 ttl pk-yrs)    Types: Cigarettes   Smokeless tobacco: Never   Tobacco comments:    Started smoking at 58 years old.    Smoked 1 PPD at  his heaviest.    Now smokes 3 cigarettes. Khj 11/09/2023  Vaping Use   Vaping status: Never Used  Substance and Sexual Activity   Alcohol use: Not Currently    Comment: stopped drinking 11/2017   Drug use: No   Sexual activity: Yes  Other Topics Concern   Not on file  Social History Narrative   Not on file   Social Drivers of Health   Financial Resource Strain: Low Risk  (07/25/2023)   Overall Financial Resource Strain (CARDIA)    Difficulty of Paying Living Expenses: Not very hard  Food Insecurity: No Food Insecurity (11/03/2023)   Hunger Vital Sign    Worried About Running Out of Food in the Last Year: Never true    Ran Out of Food in the Last Year: Never true  Transportation Needs: No Transportation Needs (07/25/2023)   PRAPARE - Administrator, Civil Service (Medical): No    Lack of Transportation (Non-Medical): No  Physical Activity: Insufficiently Active (07/25/2023)   Exercise Vital Sign    Days of Exercise per Week: 5 days    Minutes  of Exercise per Session: 10 min  Stress: No Stress Concern Present (07/25/2023)   [employer] of Occupational Health - Occupational Stress Questionnaire    Feeling of Stress : Not at all  Social Connections: Moderately Isolated (07/25/2023)   Social Connection and Isolation Panel [NHANES]    Frequency of Communication with Friends and Family: Three times a week    Frequency of Social Gatherings with Friends and Family: Once a week    Attends Religious Services: More than 4 times per year    Active Member of [employer] or Organizations: No    Attends Engineer, structural: Not on file    Marital Status: Divorced  Intimate Partner Violence: Not At Risk (11/03/2023)   Humiliation, Afraid, Rape, and Kick questionnaire    Fear of Current or Ex-Partner: No    Emotionally Abused: No    Physically Abused: No    Sexually Abused: No    FAMILY HISTORY: Family History  Problem Relation Age of Onset   Hypertension Father    Heart disease Father    Heart attack Father    Heart attack Sister    Hypertension Brother    Healthy Brother    Sickle cell anemia Sister    Hypertension Mother    Hypertension Maternal Grandmother    Hypertension Maternal Grandfather    Hypertension Paternal Grandmother    Hypertension Paternal Grandfather     ALLERGIES:  has no known allergies.  MEDICATIONS:  Current Outpatient Medications  Medication Sig Dispense Refill   amLODipine (NORVASC) 5 MG tablet TAKE 1 TABLET (5 MG TOTAL) BY MOUTH DAILY. 90 tablet 0   dexamethasone (DECADRON) 4 MG tablet Take 2 tablets daily for 2 days, start the day after chemotherapy. Take with food. 30 tablet 1   lidocaine-prilocaine (EMLA) cream Apply to affected area once 30 g 3   ondansetron (ZOFRAN) 8 MG tablet Take 1 tablet (8 mg total) by mouth every 8 (eight) hours as needed for nausea or vomiting. Start on the third day after chemotherapy. 30 tablet 1   prochlorperazine (COMPAZINE) 10 MG tablet Take 1 tablet (10 mg  total) by mouth every 6 (six) hours as needed for nausea or vomiting. 30 tablet 1   rosuvastatin (CRESTOR) 5 MG tablet TAKE 1 TABLET (5 MG TOTAL) BY MOUTH DAILY. (Patient taking differently: Take 5 mg by mouth at bedtime.)  90 tablet 1   No current facility-administered medications for this visit.    REVIEW OF SYSTEMS:   Review of Systems  Constitutional:  Positive for weight loss. Negative for chills, fever and malaise/fatigue.  HENT:  Negative for hearing loss, nosebleeds, sore throat and tinnitus.   Eyes:  Negative for blurred vision and double vision.  Respiratory:  Negative for cough, hemoptysis, shortness of breath and wheezing.   Cardiovascular:  Negative for chest pain, palpitations and leg swelling.  Gastrointestinal:  Negative for abdominal pain, blood in stool, constipation, diarrhea, melena, nausea and vomiting.  Genitourinary:  Negative for dysuria and urgency.  Musculoskeletal:  Negative for back pain, falls, joint pain and myalgias.  Skin:  Negative for itching and rash.  Neurological:  Negative for dizziness, tingling, sensory change, loss of consciousness, weakness and headaches.  Endo/Heme/Allergies:  Negative for environmental allergies. Does not bruise/bleed easily.  Psychiatric/Behavioral:  Negative for depression. The patient is nervous/anxious. The patient does not have insomnia.    PHYSICAL EXAMINATION: ECOG PERFORMANCE STATUS: 1 - Symptomatic but completely ambulatory  Vitals:   01/05/24 1004  BP: 102/79  Pulse: (!) 112  Resp: 16  Temp: (!) 96.9 F (36.1 C)  SpO2: 100%   Filed Weights   01/05/24 1004  Weight: 127 lb (57.6 kg)   General: thin build. Unaccompanied.  Eyes: Pink conjunctiva, anicteric sclera. Lungs: Diminished bilaterally. No audible wheezing or coughing Heart: Regular rate and rhythm.  Abdomen: Soft, nontender, nondistended.  Musculoskeletal: No edema, cyanosis, or clubbing. Neuro: Alert, answering all questions appropriately. Cranial  nerves grossly intact. Skin: No rashes or petechiae noted. Psych: Normal affect.   LABORATORY DATA:  I have reviewed the data as listed Lab Results  Component Value Date   WBC 7.7 01/05/2024   HGB 11.3 (L) 01/05/2024   HCT 35.8 (L) 01/05/2024   MCV 81.0 01/05/2024   PLT 408 (H) 01/05/2024   Recent Labs    04/01/23 1130 04/28/23 1016 01/05/24 0952  NA 141 142 134*  K 4.6 4.8 3.8  CL 105 103 98  CO2 22 24 24   GLUCOSE 94 90 109*  BUN 7 8 10   CREATININE 0.83 0.84 0.71  CALCIUM 10.3* 10.4* 9.3  GFRNONAA  --   --  >60  PROT 8.4 8.3 8.2*  ALBUMIN 4.3 4.4 3.0*  AST 122* 96* 135*  ALT 104* 77* 97*  ALKPHOS 103 99 81  BILITOT 0.5 0.6 0.9    RADIOGRAPHIC STUDIES: I have personally reviewed the radiological images as listed and agreed with the findings in the report. MR Brain W Wo Contrast Result Date: 12/30/2023 CLINICAL DATA:  Metastatic lung carcinoma staging EXAM: MRI HEAD WITHOUT AND WITH CONTRAST TECHNIQUE: Multiplanar, multiecho pulse sequences of the brain and surrounding structures were obtained without and with intravenous contrast. CONTRAST:  5mL GADAVIST GADOBUTROL 1 MMOL/ML IV SOLN COMPARISON:  None Available. FINDINGS: Brain: No acute infarct, mass effect or extra-axial collection. No acute or chronic hemorrhage. Minimal multifocal hyperintense T2-weight signal within the white matter. The midline structures are normal. There is no abnormal contrast enhancement. Vascular: Normal flow voids. Skull and upper cervical spine: Normal calvarium and skull base. Visualized upper cervical spine and soft tissues are normal. Sinuses/Orbits:No paranasal sinus fluid levels or advanced mucosal thickening. No mastoid or middle ear effusion. Normal orbits. IMPRESSION: No intracranial metastatic disease. Electronically Signed   By: Deatra Robinson M.D.   On: 12/30/2023 17:33   IR IMAGING GUIDED PORT INSERTION Result Date: 12/13/2023 CLINICAL DATA:  Adenocarcinoma of the right lung and need  for porta cath to begin chemotherapy. EXAM: IMPLANTED PORT A CATH PLACEMENT WITH ULTRASOUND AND FLUOROSCOPIC GUIDANCE ANESTHESIA/SEDATION: Moderate (conscious) sedation was employed during this procedure. A total of Versed 1.0 mg and Fentanyl 50 mcg was administered intravenously. Moderate Sedation Time: 44 minutes. The patient's level of consciousness and vital signs were monitored continuously by radiology nursing throughout the procedure under my direct supervision. FLUOROSCOPY: 31 seconds.  1.0 mGy. PROCEDURE: The procedure, risks, benefits, and alternatives were explained to the patient. Questions regarding the procedure were encouraged and answered. The patient understands and consents to the procedure. A time-out was performed prior to initiating the procedure. Ultrasound was utilized to confirm patency of the left internal jugular vein. An ultrasound image was saved and recorded. The left neck and chest were prepped with chlorhexidine in a sterile fashion, and a sterile drape was applied covering the operative field. Maximum barrier sterile technique with sterile gowns and gloves were used for the procedure. Local anesthesia was provided with 1% lidocaine. After creating a small venotomy incision, a 21 gauge needle was advanced into the left internal jugular vein under direct, real-time ultrasound guidance. Ultrasound image documentation was performed. After securing guidewire access, an 8 Fr dilator was placed. A J-wire was kinked to measure appropriate catheter length. A subcutaneous port pocket was then created along the upper chest wall utilizing sharp and blunt dissection. Portable cautery was utilized. The pocket was irrigated with sterile saline. A single lumen power injectable port was chosen for placement. The 8 Fr catheter was tunneled from the port pocket site to the venotomy incision. The port was placed in the pocket. External catheter was trimmed to appropriate length based on guidewire  measurement. At the venotomy, an 8 Fr peel-away sheath was placed over a guidewire. The catheter was then placed through the sheath and the sheath removed. Final catheter positioning was confirmed and documented with a fluoroscopic spot image. The port was accessed with a needle and aspirated and flushed with heparinized saline. The access needle was removed. The venotomy and port pocket incisions were closed with subcutaneous 3-0 Monocryl and subcuticular 4-0 Vicryl. Dermabond was applied to both incisions. COMPLICATIONS: COMPLICATIONS None FINDINGS: After catheter placement, the tip lies at the cavo-atrial junction. The catheter aspirates normally and is ready for immediate use. IMPRESSION: Placement of single lumen port a cath via left internal jugular vein. The catheter tip lies at the cavo-atrial junction. A power injectable port a cath was placed and is ready for immediate use. Electronically Signed   By: Irish Lack M.D.   On: 12/13/2023 12:09

## 2024-01-05 NOTE — Progress Notes (Signed)
 Met with patient during follow up visit prior to starting chemotherapy treatments. All questions answered during visit. Informed pt that will be given print out of appts prior to leaving. Instructed to call with any questions or needs. Pt verbalized understanding.

## 2024-01-05 NOTE — Patient Instructions (Signed)
 [facility]  Discharge Instructions: Thank you for choosing [facility] to provide your oncology and hematology care.  If you have a lab appointment with the Cancer Center, please go directly to the Cancer Center and check in at the registration area.  Wear comfortable clothing and clothing appropriate for easy access to any Portacath or PICC line.   We strive to give you quality time with your provider. You may need to reschedule your appointment if you arrive late (15 or more minutes).  Arriving late affects you and other patients whose appointments are after yours.  Also, if you miss three or more appointments without notifying the office, you may be dismissed from the clinic at the provider's discretion.      For prescription refill requests, have your pharmacy contact our office and allow 72 hours for refills to be completed.    Today you received the following chemotherapy and/or immunotherapy agents TAXOL and CARBOPLATIN       To help prevent nausea and vomiting after your treatment, we encourage you to take your nausea medication as directed.  BELOW ARE SYMPTOMS THAT SHOULD BE REPORTED IMMEDIATELY: *FEVER GREATER THAN 100.4 F (38 C) OR HIGHER *CHILLS OR SWEATING *NAUSEA AND VOMITING THAT IS NOT CONTROLLED WITH YOUR NAUSEA MEDICATION *UNUSUAL SHORTNESS OF BREATH *UNUSUAL BRUISING OR BLEEDING *URINARY PROBLEMS (pain or burning when urinating, or frequent urination) *BOWEL PROBLEMS (unusual diarrhea, constipation, pain near the anus) TENDERNESS IN MOUTH AND THROAT WITH OR WITHOUT PRESENCE OF ULCERS (sore throat, sores in mouth, or a toothache) UNUSUAL RASH, SWELLING OR PAIN  UNUSUAL VAGINAL DISCHARGE OR ITCHING   Items with * indicate a potential emergency and should be followed up as soon as possible or go to the Emergency Department if any problems should occur.  Please show the CHEMOTHERAPY ALERT CARD or  IMMUNOTHERAPY ALERT CARD at check-in to the Emergency Department and triage nurse.  Should you have questions after your visit or need to cancel or reschedule your appointment, please contact [facility] - A DEPT OF [facility]  [phone] and follow the prompts.  Office hours are 8:00 a.m. to 4:30 p.m. Monday - Friday. Please note that voicemails left after 4:00 p.m. may not be returned until the following business day.  We are closed weekends and major holidays. You have access to a nurse at all times for urgent questions. Please call the main number to the clinic [phone] and follow the prompts.  For any non-urgent questions, you may also contact your provider using MyChart. We now offer e-Visits for anyone 83 and older to request care online for non-urgent symptoms. For details visit [email or web address].   Also download the MyChart app! Go to the app store, search "MyChart", open the app, select [place], and log in with your MyChart username and password.  Paclitaxel Injection What is this medication? PACLITAXEL (PAK li TAX el) treats some types of cancer. It works by slowing down the growth of cancer cells. This medicine may be used for other purposes; ask your health care provider or pharmacist if you have questions. COMMON BRAND NAME(S): Onxol, Taxol What should I tell my care team before I take this medication? They need to know if you have any of these conditions: Heart disease Liver disease Low white blood cell levels An unusual or allergic reaction to paclitaxel, other medications, foods, dyes, or preservatives  If you or your partner are pregnant or trying to get pregnant Breast-feeding How should I use this medication? This medication is injected into a vein. It is given by your care team in a hospital or clinic setting. Talk to your care team about the use of this medication in children. While it may be given to children for selected  conditions, precautions do apply. Overdosage: If you think you have taken too much of this medicine contact a poison control center or emergency room at once. NOTE: This medicine is only for you. Do not share this medicine with others. What if I miss a dose? Keep appointments for follow-up doses. It is important not to miss your dose. Call your care team if you are unable to keep an appointment. What may interact with this medication? Do not take this medication with any of the following: Live virus vaccines Other medications may affect the way this medication works. Talk with your care team about all of the medications you take. They may suggest changes to your treatment plan to lower the risk of side effects and to make sure your medications work as intended. This list may not describe all possible interactions. Give your health care provider a list of all the medicines, herbs, non-prescription drugs, or dietary supplements you use. Also tell them if you smoke, drink alcohol, or use illegal drugs. Some items may interact with your medicine. What should I watch for while using this medication? Your condition will be monitored carefully while you are receiving this medication. You may need blood work while taking this medication. This medication may make you feel generally unwell. This is not uncommon as chemotherapy can affect healthy cells as well as cancer cells. Report any side effects. Continue your course of treatment even though you feel ill unless your care team tells you to stop. This medication can cause serious allergic reactions. To reduce the risk, your care team may give you other medications to take before receiving this one. Be sure to follow the directions from your care team. This medication may increase your risk of getting an infection. Call your care team for advice if you get a fever, chills, sore throat, or other symptoms of a cold or flu. Do not treat yourself. Try to avoid  being around people who are sick. This medication may increase your risk to bruise or bleed. Call your care team if you notice any unusual bleeding. Be careful brushing or flossing your teeth or using a toothpick because you may get an infection or bleed more easily. If you have any dental work done, tell your dentist you are receiving this medication. Talk to your care team if you may be pregnant. Serious birth defects can occur if you take this medication during pregnancy. Talk to your care team before breastfeeding. Changes to your treatment plan may be needed. What side effects may I notice from receiving this medication? Side effects that you should report to your care team as soon as possible: Allergic reactions--skin rash, itching, hives, swelling of the face, lips, tongue, or throat Heart rhythm changes--fast or irregular heartbeat, dizziness, feeling faint or lightheaded, chest pain, trouble breathing Increase in blood pressure Infection--fever, chills, cough, sore throat, wounds that don't heal, pain or trouble when passing urine, general feeling of discomfort or being unwell Low blood pressure--dizziness, feeling faint or lightheaded, blurry vision Low red blood cell level--unusual weakness or fatigue, dizziness, headache, trouble breathing Painful swelling, warmth, or redness of the skin, blisters  or sores at the infusion site Pain, tingling, or numbness in the hands or feet Slow heartbeat--dizziness, feeling faint or lightheaded, confusion, trouble breathing, unusual weakness or fatigue Unusual bruising or bleeding Side effects that usually do not require medical attention (report to your care team if they continue or are bothersome): Diarrhea Hair loss Joint pain Loss of appetite Muscle pain Nausea Vomiting This list may not describe all possible side effects. Call your doctor for medical advice about side effects. You may report side effects to FDA at 1-800-FDA-1088. Where  should I keep my medication? This medication is given in a hospital or clinic. It will not be stored at home. NOTE: This sheet is a summary. It may not cover all possible information. If you have questions about this medicine, talk to your doctor, pharmacist, or health care provider.  2024 Elsevier/Gold Standard (2022-02-16 00:00:00)  Carboplatin Injection What is this medication? CARBOPLATIN (KAR boe pla tin) treats some types of cancer. It works by slowing down the growth of cancer cells. This medicine may be used for other purposes; ask your health care provider or pharmacist if you have questions. COMMON BRAND NAME(S): Paraplatin What should I tell my care team before I take this medication? They need to know if you have any of these conditions: Blood disorders Hearing problems Kidney disease Recent or ongoing radiation therapy An unusual or allergic reaction to carboplatin, cisplatin, other medications, foods, dyes, or preservatives Pregnant or trying to get pregnant Breast-feeding How should I use this medication? This medication is injected into a vein. It is given by your care team in a hospital or clinic setting. Talk to your care team about the use of this medication in children. Special care may be needed. Overdosage: If you think you have taken too much of this medicine contact a poison control center or emergency room at once. NOTE: This medicine is only for you. Do not share this medicine with others. What if I miss a dose? Keep appointments for follow-up doses. It is important not to miss your dose. Call your care team if you are unable to keep an appointment. What may interact with this medication? Medications for seizures Some antibiotics, such as amikacin, gentamicin, neomycin, streptomycin, tobramycin Vaccines This list may not describe all possible interactions. Give your health care provider a list of all the medicines, herbs, non-prescription drugs, or dietary  supplements you use. Also tell them if you smoke, drink alcohol, or use illegal drugs. Some items may interact with your medicine. What should I watch for while using this medication? Your condition will be monitored carefully while you are receiving this medication. You may need blood work while taking this medication. This medication may make you feel generally unwell. This is not uncommon, as chemotherapy can affect healthy cells as well as cancer cells. Report any side effects. Continue your course of treatment even though you feel ill unless your care team tells you to stop. In some cases, you may be given additional medications to help with side effects. Follow all directions for their use. This medication may increase your risk of getting an infection. Call your care team for advice if you get a fever, chills, sore throat, or other symptoms of a cold or flu. Do not treat yourself. Try to avoid being around people who are sick. Avoid taking medications that contain aspirin, acetaminophen, ibuprofen, naproxen, or ketoprofen unless instructed by your care team. These medications may hide a fever. Be careful brushing or flossing your  teeth or using a toothpick because you may get an infection or bleed more easily. If you have any dental work done, tell your dentist you are receiving this medication. Talk to your care team if you wish to become pregnant or think you might be pregnant. This medication can cause serious birth defects. Talk to your care team about effective forms of contraception. Do not breast-feed while taking this medication. What side effects may I notice from receiving this medication? Side effects that you should report to your care team as soon as possible: Allergic reactions--skin rash, itching, hives, swelling of the face, lips, tongue, or throat Infection--fever, chills, cough, sore throat, wounds that don't heal, pain or trouble when passing urine, general feeling of  discomfort or being unwell Low red blood cell level--unusual weakness or fatigue, dizziness, headache, trouble breathing Pain, tingling, or numbness in the hands or feet, muscle weakness, change in vision, confusion or trouble speaking, loss of balance or coordination, trouble walking, seizures Unusual bruising or bleeding Side effects that usually do not require medical attention (report to your care team if they continue or are bothersome): Hair loss Nausea Unusual weakness or fatigue Vomiting This list may not describe all possible side effects. Call your doctor for medical advice about side effects. You may report side effects to FDA at 1-800-FDA-1088. Where should I keep my medication? This medication is given in a hospital or clinic. It will not be stored at home. NOTE: This sheet is a summary. It may not cover all possible information. If you have questions about this medicine, talk to your doctor, pharmacist, or health care provider.  2024 Elsevier/Gold Standard (2022-01-19 00:00:00)

## 2024-01-05 NOTE — Progress Notes (Signed)
 Per Consuello Masse, NP  OK to treat with elevated AST and ALT  Sharen Hones, PharmD, BCPS Clinical Pharmacist

## 2024-01-06 ENCOUNTER — Other Ambulatory Visit: Payer: Self-pay

## 2024-01-06 ENCOUNTER — Ambulatory Visit
Admission: RE | Admit: 2024-01-06 | Discharge: 2024-01-06 | Disposition: A | Discharge status: Home / Self Care | Source: Ambulatory Visit | Attending: Radiation Oncology | Admitting: Radiation Oncology

## 2024-01-06 DIAGNOSIS — C342 Malignant neoplasm of middle lobe, bronchus or lung: Secondary | ICD-10-CM | POA: Diagnosis not present

## 2024-01-06 LAB — RAD ONC ARIA SESSION SUMMARY
Course Elapsed Days: 4
Plan Fractions Treated to Date: 5
Plan Prescribed Dose Per Fraction: 2 Gy
Plan Total Fractions Prescribed: 35
Plan Total Prescribed Dose: 70 Gy
Reference Point Dosage Given to Date: 10 Gy
Reference Point Session Dosage Given: 2 Gy
Session Number: 5

## 2024-01-09 ENCOUNTER — Ambulatory Visit
Admission: RE | Admit: 2024-01-09 | Discharge: 2024-01-09 | Disposition: A | Discharge status: Home / Self Care | Payer: Self-pay | Source: Ambulatory Visit | Attending: Radiation Oncology | Admitting: Radiation Oncology

## 2024-01-09 ENCOUNTER — Other Ambulatory Visit: Payer: Self-pay

## 2024-01-09 DIAGNOSIS — C342 Malignant neoplasm of middle lobe, bronchus or lung: Secondary | ICD-10-CM | POA: Diagnosis not present

## 2024-01-09 LAB — RAD ONC ARIA SESSION SUMMARY
Course Elapsed Days: 7
Plan Fractions Treated to Date: 6
Plan Prescribed Dose Per Fraction: 2 Gy
Plan Total Fractions Prescribed: 35
Plan Total Prescribed Dose: 70 Gy
Reference Point Dosage Given to Date: 12 Gy
Reference Point Session Dosage Given: 2 Gy
Session Number: 6

## 2024-01-10 ENCOUNTER — Other Ambulatory Visit: Payer: Self-pay

## 2024-01-10 ENCOUNTER — Ambulatory Visit
Admission: RE | Admit: 2024-01-10 | Discharge: 2024-01-10 | Disposition: A | Discharge status: Home / Self Care | Source: Ambulatory Visit | Attending: Radiation Oncology | Admitting: Radiation Oncology

## 2024-01-10 DIAGNOSIS — C342 Malignant neoplasm of middle lobe, bronchus or lung: Secondary | ICD-10-CM | POA: Insufficient documentation

## 2024-01-10 LAB — RAD ONC ARIA SESSION SUMMARY
Course Elapsed Days: 8
Plan Fractions Treated to Date: 7
Plan Prescribed Dose Per Fraction: 2 Gy
Plan Total Fractions Prescribed: 35
Plan Total Prescribed Dose: 70 Gy
Reference Point Dosage Given to Date: 14 Gy
Reference Point Session Dosage Given: 2 Gy
Session Number: 7

## 2024-01-11 ENCOUNTER — Other Ambulatory Visit: Payer: Self-pay

## 2024-01-11 ENCOUNTER — Encounter: Payer: Self-pay | Admitting: Internal Medicine

## 2024-01-11 ENCOUNTER — Ambulatory Visit
Admission: RE | Admit: 2024-01-11 | Discharge: 2024-01-11 | Disposition: A | Discharge status: Home / Self Care | Source: Ambulatory Visit | Attending: Radiation Oncology | Admitting: Radiation Oncology

## 2024-01-11 DIAGNOSIS — C342 Malignant neoplasm of middle lobe, bronchus or lung: Secondary | ICD-10-CM | POA: Diagnosis not present

## 2024-01-11 LAB — RAD ONC ARIA SESSION SUMMARY
Course Elapsed Days: 9
Plan Fractions Treated to Date: 8
Plan Prescribed Dose Per Fraction: 2 Gy
Plan Total Fractions Prescribed: 35
Plan Total Prescribed Dose: 70 Gy
Reference Point Dosage Given to Date: 16 Gy
Reference Point Session Dosage Given: 2 Gy
Session Number: 8

## 2024-01-12 ENCOUNTER — Encounter: Payer: Self-pay | Admitting: Internal Medicine

## 2024-01-12 ENCOUNTER — Inpatient Hospital Stay: Payer: Self-pay

## 2024-01-12 ENCOUNTER — Encounter: Payer: Self-pay | Admitting: *Deleted

## 2024-01-12 ENCOUNTER — Inpatient Hospital Stay (HOSPITAL_BASED_OUTPATIENT_CLINIC_OR_DEPARTMENT_OTHER): Admitting: Internal Medicine

## 2024-01-12 ENCOUNTER — Ambulatory Visit
Admission: RE | Admit: 2024-01-12 | Discharge: 2024-01-12 | Disposition: A | Discharge status: Home / Self Care | Source: Ambulatory Visit | Attending: Radiation Oncology | Admitting: Radiation Oncology

## 2024-01-12 ENCOUNTER — Other Ambulatory Visit: Payer: Self-pay

## 2024-01-12 VITALS — BP 114/70 | HR 110 | Temp 97.8°F | Resp 20 | Wt 128.0 lb

## 2024-01-12 VITALS — BP 103/74 | HR 92 | Temp 97.0°F | Resp 18

## 2024-01-12 DIAGNOSIS — I10 Essential (primary) hypertension: Secondary | ICD-10-CM | POA: Insufficient documentation

## 2024-01-12 DIAGNOSIS — I700 Atherosclerosis of aorta: Secondary | ICD-10-CM | POA: Insufficient documentation

## 2024-01-12 DIAGNOSIS — B1920 Unspecified viral hepatitis C without hepatic coma: Secondary | ICD-10-CM | POA: Insufficient documentation

## 2024-01-12 DIAGNOSIS — C3491 Malignant neoplasm of unspecified part of right bronchus or lung: Secondary | ICD-10-CM | POA: Diagnosis not present

## 2024-01-12 DIAGNOSIS — F17210 Nicotine dependence, cigarettes, uncomplicated: Secondary | ICD-10-CM | POA: Insufficient documentation

## 2024-01-12 DIAGNOSIS — Z79630 Long term (current) use of alkylating agent: Secondary | ICD-10-CM | POA: Insufficient documentation

## 2024-01-12 DIAGNOSIS — Z5111 Encounter for antineoplastic chemotherapy: Secondary | ICD-10-CM | POA: Diagnosis not present

## 2024-01-12 DIAGNOSIS — C3411 Malignant neoplasm of upper lobe, right bronchus or lung: Secondary | ICD-10-CM | POA: Insufficient documentation

## 2024-01-12 DIAGNOSIS — C342 Malignant neoplasm of middle lobe, bronchus or lung: Secondary | ICD-10-CM | POA: Diagnosis not present

## 2024-01-12 DIAGNOSIS — K219 Gastro-esophageal reflux disease without esophagitis: Secondary | ICD-10-CM | POA: Insufficient documentation

## 2024-01-12 DIAGNOSIS — J320 Chronic maxillary sinusitis: Secondary | ICD-10-CM | POA: Insufficient documentation

## 2024-01-12 DIAGNOSIS — Z79899 Other long term (current) drug therapy: Secondary | ICD-10-CM | POA: Insufficient documentation

## 2024-01-12 DIAGNOSIS — Z79633 Long term (current) use of mitotic inhibitor: Secondary | ICD-10-CM | POA: Insufficient documentation

## 2024-01-12 DIAGNOSIS — Z8619 Personal history of other infectious and parasitic diseases: Secondary | ICD-10-CM | POA: Insufficient documentation

## 2024-01-12 DIAGNOSIS — Z832 Family history of diseases of the blood and blood-forming organs and certain disorders involving the immune mechanism: Secondary | ICD-10-CM | POA: Insufficient documentation

## 2024-01-12 DIAGNOSIS — Z8249 Family history of ischemic heart disease and other diseases of the circulatory system: Secondary | ICD-10-CM | POA: Insufficient documentation

## 2024-01-12 DIAGNOSIS — E785 Hyperlipidemia, unspecified: Secondary | ICD-10-CM | POA: Insufficient documentation

## 2024-01-12 LAB — CBC WITH DIFFERENTIAL (CANCER CENTER ONLY)
Abs Immature Granulocytes: 0.07 10*3/uL (ref 0.00–0.07)
Basophils Absolute: 0 10*3/uL (ref 0.0–0.1)
Basophils Relative: 0 %
Eosinophils Absolute: 0.1 10*3/uL (ref 0.0–0.5)
Eosinophils Relative: 2 %
HCT: 36.8 % — ABNORMAL LOW (ref 39.0–52.0)
Hemoglobin: 11.6 g/dL — ABNORMAL LOW (ref 13.0–17.0)
Immature Granulocytes: 1 %
Lymphocytes Relative: 15 %
Lymphs Abs: 0.9 10*3/uL (ref 0.7–4.0)
MCH: 25.7 pg — ABNORMAL LOW (ref 26.0–34.0)
MCHC: 31.5 g/dL (ref 30.0–36.0)
MCV: 81.6 fL (ref 80.0–100.0)
Monocytes Absolute: 0.6 10*3/uL (ref 0.1–1.0)
Monocytes Relative: 10 %
Neutro Abs: 4.6 10*3/uL (ref 1.7–7.7)
Neutrophils Relative %: 72 %
Platelet Count: 324 10*3/uL (ref 150–400)
RBC: 4.51 MIL/uL (ref 4.22–5.81)
RDW: 14.4 % (ref 11.5–15.5)
WBC Count: 6.3 10*3/uL (ref 4.0–10.5)
nRBC: 0 % (ref 0.0–0.2)

## 2024-01-12 LAB — RAD ONC ARIA SESSION SUMMARY
Course Elapsed Days: 10
Plan Fractions Treated to Date: 9
Plan Prescribed Dose Per Fraction: 2 Gy
Plan Total Fractions Prescribed: 35
Plan Total Prescribed Dose: 70 Gy
Reference Point Dosage Given to Date: 18 Gy
Reference Point Session Dosage Given: 2 Gy
Session Number: 9

## 2024-01-12 LAB — CMP (CANCER CENTER ONLY)
ALT: 83 U/L — ABNORMAL HIGH (ref 0–44)
AST: 64 U/L — ABNORMAL HIGH (ref 15–41)
Albumin: 3 g/dL — ABNORMAL LOW (ref 3.5–5.0)
Alkaline Phosphatase: 82 U/L (ref 38–126)
Anion gap: 8 (ref 5–15)
BUN: 13 mg/dL (ref 6–20)
CO2: 25 mmol/L (ref 22–32)
Calcium: 9.3 mg/dL (ref 8.9–10.3)
Chloride: 102 mmol/L (ref 98–111)
Creatinine: 0.61 mg/dL (ref 0.61–1.24)
GFR, Estimated: >60 mL/min (ref >60)
Glucose, Bld: 127 mg/dL — ABNORMAL HIGH (ref 70–99)
Potassium: 4.4 mmol/L (ref 3.5–5.1)
Sodium: 135 mmol/L (ref 135–145)
Total Bilirubin: 0.8 mg/dL (ref 0.0–1.2)
Total Protein: 7.3 g/dL (ref 6.5–8.1)

## 2024-01-12 MED ORDER — DEXAMETHASONE SODIUM PHOSPHATE 10 MG/ML IJ SOLN
10.0000 mg | Freq: Once | INTRAMUSCULAR | Status: AC
  Administered 2024-01-12: 10 mg via INTRAVENOUS
  Filled 2024-01-12: qty 1

## 2024-01-12 MED ORDER — HEPARIN SOD (PORK) LOCK FLUSH 100 UNIT/ML IV SOLN
500.0000 [IU] | Freq: Once | INTRAVENOUS | Status: AC | PRN
  Administered 2024-01-12: 500 [IU]
  Filled 2024-01-12: qty 5

## 2024-01-12 MED ORDER — SODIUM CHLORIDE 0.9% FLUSH
10.0000 mL | INTRAVENOUS | Status: DC | PRN
  Filled 2024-01-12: qty 10

## 2024-01-12 MED ORDER — FAMOTIDINE IN NACL 20-0.9 MG/50ML-% IV SOLN
20.0000 mg | Freq: Once | INTRAVENOUS | Status: AC
  Administered 2024-01-12: 20 mg via INTRAVENOUS
  Filled 2024-01-12: qty 50

## 2024-01-12 MED ORDER — SODIUM CHLORIDE 0.9 % IV SOLN
223.8000 mg | Freq: Once | INTRAVENOUS | Status: AC
  Administered 2024-01-12: 220 mg via INTRAVENOUS
  Filled 2024-01-12: qty 22

## 2024-01-12 MED ORDER — SODIUM CHLORIDE 0.9 % IV SOLN
INTRAVENOUS | Status: DC
  Filled 2024-01-12: qty 250

## 2024-01-12 MED ORDER — SODIUM CHLORIDE 0.9 % IV SOLN
45.0000 mg/m2 | Freq: Once | INTRAVENOUS | Status: AC
  Administered 2024-01-12: 78 mg via INTRAVENOUS
  Filled 2024-01-12: qty 13

## 2024-01-12 MED ORDER — DIPHENHYDRAMINE HCL 50 MG/ML IJ SOLN
50.0000 mg | Freq: Once | INTRAMUSCULAR | Status: AC
  Administered 2024-01-12: 50 mg via INTRAVENOUS
  Filled 2024-01-12: qty 1

## 2024-01-12 MED ORDER — PALONOSETRON HCL INJECTION 0.25 MG/5ML
0.2500 mg | Freq: Once | INTRAVENOUS | Status: AC
  Administered 2024-01-12: 0.25 mg via INTRAVENOUS
  Filled 2024-01-12: qty 5

## 2024-01-12 NOTE — Progress Notes (Signed)
 Patient is doing great, he says that he was able to leave here yesterday and go fishing, and that everyone here makes him feel good. No new questions for doctor today. He is having a little bit of acid reflux, but not too bad right now.

## 2024-01-12 NOTE — Progress Notes (Signed)
 Nutrition Follow-up:  Patient with stage III lung adenocarcinoma.  Patient receiving concurrent chemotherapy and radiation.   Met with patient during infusion.  Reports that he had some nausea about day 3 of last round of treatment.  Took nausea medication and it improved.  Reports some reflux and will pick up OTC medication today to help per MD recommendation.  Also having some constipation and will pick up miralax per MD.  Drinking high calorie shakes BID.  Yesterday ate eggs and toast for breakfast.  Lunch was a corndog and dinner last night was steak.      Medications: reviewed  Labs: reviewed  Anthropometrics:   Weight 128 lb today 131 lb 4.8 oz on 3/4 140 lb in Jan 2025   NUTRITION DIAGNOSIS: Unintentional weight loss continues    INTERVENTION:  Severe malnutrition continues      NEXT VISIT: Thursday, April 17 during infusion  Sol Englert B. Elease Hashimoto, CSO, LDN Registered Dietitian [phone]

## 2024-01-12 NOTE — Patient Instructions (Signed)
 [facility]  Discharge Instructions: Thank you for choosing [facility] to provide your oncology and hematology care.  If you have a lab appointment with the Cancer Center, please go directly to the Cancer Center and check in at the registration area.  Wear comfortable clothing and clothing appropriate for easy access to any Portacath or PICC line.   We strive to give you quality time with your provider. You may need to reschedule your appointment if you arrive late (15 or more minutes).  Arriving late affects you and other patients whose appointments are after yours.  Also, if you miss three or more appointments without notifying the office, you may be dismissed from the clinic at the provider's discretion.      For prescription refill requests, have your pharmacy contact our office and allow 72 hours for refills to be completed.    Today you received the following chemotherapy and/or immunotherapy agents Paclitaxel, Carboplatin      To help prevent nausea and vomiting after your treatment, we encourage you to take your nausea medication as directed.  BELOW ARE SYMPTOMS THAT SHOULD BE REPORTED IMMEDIATELY: *FEVER GREATER THAN 100.4 F (38 C) OR HIGHER *CHILLS OR SWEATING *NAUSEA AND VOMITING THAT IS NOT CONTROLLED WITH YOUR NAUSEA MEDICATION *UNUSUAL SHORTNESS OF BREATH *UNUSUAL BRUISING OR BLEEDING *URINARY PROBLEMS (pain or burning when urinating, or frequent urination) *BOWEL PROBLEMS (unusual diarrhea, constipation, pain near the anus) TENDERNESS IN MOUTH AND THROAT WITH OR WITHOUT PRESENCE OF ULCERS (sore throat, sores in mouth, or a toothache) UNUSUAL RASH, SWELLING OR PAIN  UNUSUAL VAGINAL DISCHARGE OR ITCHING   Items with * indicate a potential emergency and should be followed up as soon as possible or go to the Emergency Department if any problems should occur.  Please show the CHEMOTHERAPY ALERT CARD or  IMMUNOTHERAPY ALERT CARD at check-in to the Emergency Department and triage nurse.  Should you have questions after your visit or need to cancel or reschedule your appointment, please contact [facility] - A DEPT OF [facility]  [phone] and follow the prompts.  Office hours are 8:00 a.m. to 4:30 p.m. Monday - Friday. Please note that voicemails left after 4:00 p.m. may not be returned until the following business day.  We are closed weekends and major holidays. You have access to a nurse at all times for urgent questions. Please call the main number to the clinic [phone] and follow the prompts.  For any non-urgent questions, you may also contact your provider using MyChart. We now offer e-Visits for anyone 23 and older to request care online for non-urgent symptoms. For details visit [email or web address].   Also download the MyChart app! Go to the app store, search "MyChart", open the app, select [place], and log in with your MyChart username and password.

## 2024-01-12 NOTE — Progress Notes (Signed)
 [facility] CONSULT NOTE  Patient Care Team: Pardue, Monico Blitz, DO as PCP - General (Family Medicine) Glory Buff, RN as Oncology Nurse Navigator Carmina Miller, MD as Consulting Physician (Radiation Oncology)  Reason for visit-stage IIIa lung adenocarcinoma  CANCER STAGING   Cancer Staging  Lung cancer [facility]) Staging form: Lung, AJCC V9 - Clinical: Stage IIIA (cT4, cN1, cM0) - Signed by Michaelyn Barter, MD on 11/24/2023   ASSESSMENT & PLAN:  Nicholas Mccoy 58 y.o. male with pmh of alcohol use, hepatitis C, hyperlipidemia, hypertension referred to medical oncology for right lung mass.  # Right lung adenocarcinoma, stage IIIA  -PET scan showed 7.3 x 4.9 cm hypermetabolic large right lung mass SUV 23.1.  Prominent right hilar mass or lymph node with SUV 12.1 measures 3.4 x 2.9 cm.  No metastatic disease outside of the chest.  -Status post EBUS with biopsy showed lung adenocarcinoma from right lung upper lobe mass FNA and brushing.  Lymph node station 7, 11R superior and inferior negative for malignancy.  -Tempus testing was reviewed with no targetable mutations.  PD-L1 5%.  -MRI brain negative for metastatic disease. -Patient eventually agreed for concurrent chemo RT regimen.  Tolerating radiation well.  Does report mild acid reflux which she is managing with over-the-counter medications.  He does not recall the name.  I recommended Prilosec/Protonix which she would like to hold off.  Labs reviewed and acceptable for treatment.  Will proceed with cycle 2 of weekly carboplatin and Taxol.  Follow-up in 1 week.  # Active smoker -Discussed about smoking cessation.  Referral to smoking cessation program.  # Hepatitis C positive -Follows with Dr. Tobi Bastos.  He is not on active treatment.   Orders Placed This Encounter  Procedures   CBC with Differential (Cancer Center Only)    Standing Status:   Future    Expected Date:   01/19/2024    Expiration Date:   01/18/2025    CMP (Cancer Center only)    Standing Status:   Future    Expected Date:   01/19/2024    Expiration Date:   01/18/2025   CBC with Differential (Cancer Center Only)    Standing Status:   Future    Expected Date:   01/26/2024    Expiration Date:   01/25/2025   CMP (Cancer Center only)    Standing Status:   Future    Expected Date:   01/26/2024    Expiration Date:   01/25/2025   CBC with Differential (Cancer Center Only)    Standing Status:   Future    Expected Date:   02/02/2024    Expiration Date:   02/01/2025   CMP (Cancer Center only)    Standing Status:   Future    Expected Date:   02/02/2024    Expiration Date:   02/01/2025   CBC with Differential (Cancer Center Only)    Standing Status:   Future    Expected Date:   02/09/2024    Expiration Date:   02/08/2025   CMP (Cancer Center only)    Standing Status:   Future    Expected Date:   02/09/2024    Expiration Date:   02/08/2025   RTC in 1 week for MD visit, labs, cycle 3 carbo Taxol  The total time spent in the appointment was 30 minutes encounter with patients including review of chart and various tests results, discussions about plan of care and coordination of care plan   All questions  were answered. The patient knows to call the clinic with any problems, questions or concerns. No barriers to learning was detected.  Michaelyn Barter, MD 4/3/20254:31 PM   HISTORY OF PRESENTING ILLNESS:  Nicholas Mccoy 58 y.o. male with pmh of alcohol use, hepatitis C, hyperlipidemia, hypertension referred to medical oncology for right lung mass.  Patient presented to [facility] ED on 10/25/2023 with complaints of chest pain.  CTA chest showed large enhancing mass in the right lung abutting the pleura appears predominantly located in the posterior aspect of right middle lobe measures 7 x 6.4 x 7 cm.  Right inferior hilar nodule measures 2.7 cm suspicious for nodal involvement.  Encases and severely narrows the right middle lobe bronchus with mass effect and  displacement of right superior pulmonary vein.  Additional borderline enlarged right superior hilar lymph node 1 cm.  Patient is an active smoker for several years.  Smokes half to 1 pack a day.  Interval history Patient was seen today as follow-up on concurrent chemo RT for stage III lung cancer. Doing well overall.  Reports mild acid reflux which is manageable.  Denies any other concerns.  I have reviewed his chart and materials related to his cancer extensively and collaborated history with the patient. Summary of oncologic history is as follows: Oncology History  Lung cancer (HCC)  11/09/2023 PET scan   FINDINGS: Mediastinal blood pool activity: SUV max 1.6   Liver activity: SUV max NA   NECK: No significant abnormal hypermetabolic activity in this region.   Incidental CT findings: Mild chronic left maxillary sinusitis.   CHEST: Large right lung mass primarily centered in the right middle lobe although based on recent CT this is likely to be crossing the minor fissure into the right upper lobe and potentially crossing the major fissure into the right lower lobe, maximum SUV 23.1, the associated hypermetabolic activity measures about 7.3 by 4.9 cm. The mass abuts the right pleural margin laterally without pleural effusion or separate pleural hypermetabolic activity.   There is also a prominent right hilar mass or lymph node with maximum SUV 12.1 measuring about 3.4 by 2.9 cm.   Postobstructive atelectasis in the right middle lobe.   Incidental CT findings: Scattered paraseptal bulla at the lung apices.   ABDOMEN/PELVIS: No significant abnormal hypermetabolic activity in this region.   Incidental CT findings: Small benign calcified lesion of the left posteroinferior spleen. Atherosclerosis is present, including aortoiliac atherosclerotic disease.   SKELETON: No significant abnormal hypermetabolic activity in this region.   Incidental CT findings: None.    IMPRESSION: 1. Large right lung mass primarily centered in the right middle lobe although based on recent CT this is likely crossing the minor fissure into the right upper lobe and potentially crossing the major fissure into the right lower lobe. 2. Prominent right hilar mass or lymph node with maximum SUV 12.1 measuring about 3.4 by 2.9 cm. 3. No findings of distant metastatic disease. 4. Mild chronic left maxillary sinusitis. 5. Scattered paraseptal bulla at the lung apices. 6. Aortic atherosclerosis.   11/17/2023 Procedure   S/p EBUS with biopsy with Dr. Larinda Buttery.   FINAL MICROSCOPIC DIAGNOSIS:  A.  RIGHT LUNG, UPPER LOBE, MASS, FINE NEEDLE ASPIRATION  BIOPSY:  - Malignant  - Adenocarcinoma   B.  RIGHT LUNG, UPPER LOBE, MASS, BRUSHING:  - Malignant  - Adenocarcinoma   Specimen Submitted:  C. LUNG, RUL, LAVAGE:    FINAL MICROSCOPIC DIAGNOSIS:  - Atypical cells present  - Rare atypical  large cells suspicious for tumor   FINAL MICROSCOPIC DIAGNOSIS:  D. LYMPH NODE, STATION 7, FINE NEEDLE ASPIRATION:  - Negative for malignancy  - Compatible with benign lymph node   E. LYMPH NODE, STATION 11R SUPERIOR, FINE NEEDLE ASPIRATION:  - Negative for malignancy  - Compatible with a benign lymph node   F. LYMPH NODE, STATION 11R INFERIOR, FINE NEEDLE ASPIRATION:  - Negative for malignancy  - Compatible with a benign lymph node      11/24/2023 Cancer Staging   Staging form: Lung, AJCC V9 - Clinical: Stage IIIA (cT4, cN1, cM0) - Signed by Michaelyn Barter, MD on 11/24/2023   12/08/2023 - 12/08/2023 Chemotherapy   Patient is on Treatment Plan : LUNG Carboplatin + Paclitaxel + XRT q7d     12/15/2023 - 12/15/2023 Chemotherapy   Patient is on Treatment Plan : LUNG Carboplatin (5) + Pemetrexed (500) + Pembrolizumab (200) D1 q21d Induction x 4 cycles / Maintenance Pemetrexed (500) + Pembrolizumab (200) D1 q21d     01/05/2024 -  Chemotherapy   Patient is on Treatment Plan : LUNG Carboplatin +  Paclitaxel + XRT q7d       MEDICAL HISTORY:  Past Medical History:  Diagnosis Date   Alcohol abuse    Alcohol abuse, daily use 05/04/2023   Hepatitis C    Hyperlipidemia    Hypertension    Pneumonia    x several    SURGICAL HISTORY: Past Surgical History:  Procedure Laterality Date   BRONCHIAL BIOPSY  11/17/2023   Procedure: BRONCHIAL BIOPSIES;  Surgeon: Janann Colonel, MD;  Location: [facility] ENDOSCOPY;  Service: Pulmonary;;   BRONCHIAL BRUSHINGS  11/17/2023   Procedure: BRONCHIAL BRUSHINGS;  Surgeon: Janann Colonel, MD;  Location: [facility] ENDOSCOPY;  Service: Pulmonary;;   BRONCHIAL NEEDLE ASPIRATION BIOPSY  11/17/2023   Procedure: BRONCHIAL NEEDLE ASPIRATION BIOPSIES;  Surgeon: Janann Colonel, MD;  Location: [facility] ENDOSCOPY;  Service: Pulmonary;;   BRONCHIAL WASHINGS  11/17/2023   Procedure: BRONCHIAL WASHINGS;  Surgeon: Janann Colonel, MD;  Location: [facility] ENDOSCOPY;  Service: Pulmonary;;   COLONOSCOPY WITH PROPOFOL N/A 06/28/2023   Procedure: COLONOSCOPY WITH PROPOFOL;  Surgeon: Wyline Mood, MD;  Location[facility];  Service: Gastroenterology;  Laterality: N/A;   ENDOBRONCHIAL ULTRASOUND Bilateral 11/17/2023   Procedure: ENDOBRONCHIAL ULTRASOUND;  Surgeon: Janann Colonel, MD;  Location: [facility] ENDOSCOPY;  Service: Pulmonary;  Laterality: Bilateral;   FINE NEEDLE ASPIRATION  11/17/2023   Procedure: FINE NEEDLE ASPIRATION (FNA) LINEAR;  Surgeon: Janann Colonel, MD;  Location: [facility] ENDOSCOPY;  Service: Pulmonary;;   HEMOSTASIS CONTROL  11/17/2023   Procedure: HEMOSTASIS CONTROL;  Surgeon: Janann Colonel, MD;  Location: [facility] ENDOSCOPY;  Service: Pulmonary;;   IR IMAGING GUIDED PORT INSERTION  12/13/2023   POLYPECTOMY  06/28/2023   Procedure: POLYPECTOMY;  Surgeon: Wyline Mood, MD;  Location: [facility] ENDOSCOPY;  Service: Gastroenterology;;   VIDEO BRONCHOSCOPY WITH RADIAL ENDOBRONCHIAL ULTRASOUND  11/17/2023   Procedure: VIDEO BRONCHOSCOPY WITH RADIAL ENDOBRONCHIAL  ULTRASOUND;  Surgeon: Janann Colonel, MD;  Location: [facility] ENDOSCOPY;  Service: Pulmonary;;    SOCIAL HISTORY: Social History   Socioeconomic History   Marital status: Divorced    Spouse name: Not on file   Number of children: Not on file   Years of education: Not on file   Highest education level: 10th grade  Occupational History   Not on file  Tobacco Use   Smoking status: Every Day    Current packs/day: 0.50    Average packs/day: 0.5 packs/day for 25.0 years (12.5 ttl pk-yrs)  Types: Cigarettes   Smokeless tobacco: Never   Tobacco comments:    Started smoking at 57 years old.    Smoked 1 PPD at his heaviest.    Now smokes 3 cigarettes. Khj 11/09/2023  Vaping Use   Vaping status: Never Used  Substance and Sexual Activity   Alcohol use: Not Currently    Comment: stopped drinking 11/2017   Drug use: No   Sexual activity: Yes  Other Topics Concern   Not on file  Social History Narrative   Not on file   Social Drivers of Health   Financial Resource Strain: Low Risk  (07/25/2023)   Overall Financial Resource Strain (CARDIA)    Difficulty of Paying Living Expenses: Not very hard  Food Insecurity: No Food Insecurity (11/03/2023)   Hunger Vital Sign    Worried About Running Out of Food in the Last Year: Never true    Ran Out of Food in the Last Year: Never true  Transportation Needs: No Transportation Needs (07/25/2023)   PRAPARE - Administrator, Civil Service (Medical): No    Lack of Transportation (Non-Medical): No  Physical Activity: Insufficiently Active (07/25/2023)   Exercise Vital Sign    Days of Exercise per Week: 5 days    Minutes of Exercise per Session: 10 min  Stress: No Stress Concern Present (07/25/2023)   [employer] of Occupational Health - Occupational Stress Questionnaire    Feeling of Stress : Not at all  Social Connections: Moderately Isolated (07/25/2023)   Social Connection and Isolation Panel [NHANES]    Frequency of  Communication with Friends and Family: Three times a week    Frequency of Social Gatherings with Friends and Family: Once a week    Attends Religious Services: More than 4 times per year    Active Member of [employer] or Organizations: No    Attends Engineer, structural: Not on file    Marital Status: Divorced  Intimate Partner Violence: Not At Risk (11/03/2023)   Humiliation, Afraid, Rape, and Kick questionnaire    Fear of Current or Ex-Partner: No    Emotionally Abused: No    Physically Abused: No    Sexually Abused: No    FAMILY HISTORY: Family History  Problem Relation Age of Onset   Hypertension Father    Heart disease Father    Heart attack Father    Heart attack Sister    Hypertension Brother    Healthy Brother    Sickle cell anemia Sister    Hypertension Mother    Hypertension Maternal Grandmother    Hypertension Maternal Grandfather    Hypertension Paternal Grandmother    Hypertension Paternal Grandfather     ALLERGIES:  has no known allergies.  MEDICATIONS:  Current Outpatient Medications  Medication Sig Dispense Refill   amLODipine (NORVASC) 5 MG tablet TAKE 1 TABLET (5 MG TOTAL) BY MOUTH DAILY. 90 tablet 0   dexamethasone (DECADRON) 4 MG tablet Take 2 tablets daily for 2 days, start the day after chemotherapy. Take with food. 30 tablet 1   lidocaine-prilocaine (EMLA) cream Apply to affected area once 30 g 3   ondansetron (ZOFRAN) 8 MG tablet Take 1 tablet (8 mg total) by mouth every 8 (eight) hours as needed for nausea or vomiting. Start on the third day after chemotherapy. 30 tablet 1   prochlorperazine (COMPAZINE) 10 MG tablet Take 1 tablet (10 mg total) by mouth every 6 (six) hours as needed for nausea or vomiting. 30 tablet 1  rosuvastatin (CRESTOR) 5 MG tablet TAKE 1 TABLET (5 MG TOTAL) BY MOUTH DAILY. (Patient taking differently: Take 5 mg by mouth at bedtime.) 90 tablet 1   varenicline (CHANTIX) 0.5 MG tablet Days 1 to 3- 0.5 mg once daily, Days 4 to  7-0.5 mg twice daily, Maintenance day 8 and later 1 mg twice daily. If side effects, reduce dose to previously tolerated dose. 60 tablet 0   No current facility-administered medications for this visit.   Facility-Administered Medications Ordered in Other Visits  Medication Dose Route Frequency Provider Last Rate Last Admin   0.9 %  sodium chloride infusion   Intravenous Continuous Alinda Dooms, NP   Stopped at 01/12/24 1515   sodium chloride flush (NS) 0.9 % injection 10 mL  10 mL Intracatheter PRN Alinda Dooms, NP        REVIEW OF SYSTEMS:   Pertinent information mentioned in HPI All other systems were reviewed with the patient and are negative.  PHYSICAL EXAMINATION: ECOG PERFORMANCE STATUS: 1 - Symptomatic but completely ambulatory  Vitals:   01/12/24 1144  BP: 114/70  Pulse: (!) 110  Resp: 20  Temp: 97.8 F (36.6 C)  SpO2: 100%    Filed Weights   01/12/24 1144  Weight: 128 lb (58.1 kg)     GENERAL:alert, no distress and comfortable SKIN: skin color, texture, turgor are normal, no rashes or significant lesions EYES: normal, conjunctiva are pink and non-injected, sclera clear OROPHARYNX:no exudate, no erythema and lips, buccal mucosa, and tongue normal  NECK: supple, thyroid normal size, non-tender, without nodularity LYMPH:  no palpable lymphadenopathy in the cervical, axillary or inguinal LUNGS: clear to auscultation and percussion with normal breathing effort HEART: regular rate & rhythm and no murmurs and no lower extremity edema ABDOMEN:abdomen soft, non-tender and normal bowel sounds Musculoskeletal:no cyanosis of digits and no clubbing  PSYCH: alert & oriented x 3 with fluent speech NEURO: no focal motor/sensory deficits  LABORATORY DATA:  I have reviewed the data as listed Lab Results  Component Value Date   WBC 6.3 01/12/2024   HGB 11.6 (L) 01/12/2024   HCT 36.8 (L) 01/12/2024   MCV 81.6 01/12/2024   PLT 324 01/12/2024   Recent Labs     04/28/23 1016 01/05/24 0952 01/12/24 1114  NA 142 134* 135  K 4.8 3.8 4.4  CL 103 98 102  CO2 24 24 25   GLUCOSE 90 109* 127*  BUN 8 10 13   CREATININE 0.84 0.71 0.61  CALCIUM 10.4* 9.3 9.3  GFRNONAA  --  >60 >60  PROT 8.3 8.2* 7.3  ALBUMIN 4.4 3.0* 3.0*  AST 96* 135* 64*  ALT 77* 97* 83*  ALKPHOS 99 81 82  BILITOT 0.6 0.9 0.8    RADIOGRAPHIC STUDIES: I have personally reviewed the radiological images as listed and agreed with the findings in the report. No results found.

## 2024-01-13 ENCOUNTER — Other Ambulatory Visit: Payer: Self-pay

## 2024-01-13 ENCOUNTER — Ambulatory Visit
Admission: RE | Admit: 2024-01-13 | Discharge: 2024-01-13 | Discharge status: Home / Self Care | Source: Ambulatory Visit | Attending: Radiation Oncology | Admitting: Radiation Oncology

## 2024-01-13 DIAGNOSIS — C342 Malignant neoplasm of middle lobe, bronchus or lung: Secondary | ICD-10-CM | POA: Diagnosis not present

## 2024-01-13 LAB — RAD ONC ARIA SESSION SUMMARY
Course Elapsed Days: 11
Plan Fractions Treated to Date: 10
Plan Prescribed Dose Per Fraction: 2 Gy
Plan Total Fractions Prescribed: 35
Plan Total Prescribed Dose: 70 Gy
Reference Point Dosage Given to Date: 20 Gy
Reference Point Session Dosage Given: 2 Gy
Session Number: 10

## 2024-01-14 ENCOUNTER — Other Ambulatory Visit: Payer: Self-pay | Admitting: Family Medicine

## 2024-01-14 DIAGNOSIS — I10 Essential (primary) hypertension: Secondary | ICD-10-CM

## 2024-01-16 ENCOUNTER — Telehealth: Payer: Self-pay

## 2024-01-16 ENCOUNTER — Other Ambulatory Visit: Payer: Self-pay

## 2024-01-16 ENCOUNTER — Ambulatory Visit
Admission: RE | Admit: 2024-01-16 | Discharge: 2024-01-16 | Disposition: A | Discharge status: Home / Self Care | Source: Ambulatory Visit | Attending: Radiation Oncology | Admitting: Radiation Oncology

## 2024-01-16 DIAGNOSIS — C342 Malignant neoplasm of middle lobe, bronchus or lung: Secondary | ICD-10-CM | POA: Diagnosis not present

## 2024-01-16 LAB — RAD ONC ARIA SESSION SUMMARY
Course Elapsed Days: 14
Plan Fractions Treated to Date: 11
Plan Prescribed Dose Per Fraction: 2 Gy
Plan Total Fractions Prescribed: 35
Plan Total Prescribed Dose: 70 Gy
Reference Point Dosage Given to Date: 22 Gy
Reference Point Session Dosage Given: 2 Gy
Session Number: 11

## 2024-01-16 NOTE — Telephone Encounter (Signed)
 Patient currently doing daily radiation- courtesy refill given.  Requested Prescriptions  Pending Prescriptions Disp Refills   amLODipine (NORVASC) 5 MG tablet [Pharmacy Med Name: AMLODIPINE BESYLATE 5 MG TAB] 30 tablet 0    Sig: TAKE 1 TABLET (5 MG TOTAL) BY MOUTH DAILY.     Cardiovascular: Calcium Channel Blockers 2 Failed - 01/16/2024  2:13 PM      Failed - Valid encounter within last 6 months    Recent Outpatient Visits   None            Passed - Last BP in normal range    BP Readings from Last 1 Encounters:  01/12/24 103/74         Passed - Last Heart Rate in normal range    Pulse Readings from Last 1 Encounters:  01/12/24 92

## 2024-01-16 NOTE — Telephone Encounter (Signed)
 Telephone call to patient for follow up after receiving first infusion.   Patient states this was his second infusion and it went great.  States eating good and drinking plenty of fluids.   Denies any nausea or vomiting.  Encouraged patient to call for any concerns or questions.

## 2024-01-17 ENCOUNTER — Ambulatory Visit
Admission: RE | Admit: 2024-01-17 | Discharge: 2024-01-17 | Disposition: A | Discharge status: Home / Self Care | Source: Ambulatory Visit | Attending: Radiation Oncology | Admitting: Radiation Oncology

## 2024-01-17 ENCOUNTER — Other Ambulatory Visit: Payer: Self-pay

## 2024-01-17 DIAGNOSIS — C342 Malignant neoplasm of middle lobe, bronchus or lung: Secondary | ICD-10-CM | POA: Diagnosis not present

## 2024-01-17 LAB — RAD ONC ARIA SESSION SUMMARY
Course Elapsed Days: 15
Plan Fractions Treated to Date: 12
Plan Prescribed Dose Per Fraction: 2 Gy
Plan Total Fractions Prescribed: 35
Plan Total Prescribed Dose: 70 Gy
Reference Point Dosage Given to Date: 24 Gy
Reference Point Session Dosage Given: 2 Gy
Session Number: 12

## 2024-01-18 ENCOUNTER — Other Ambulatory Visit: Payer: Self-pay | Admitting: Nurse Practitioner

## 2024-01-18 ENCOUNTER — Other Ambulatory Visit: Payer: Self-pay

## 2024-01-18 ENCOUNTER — Ambulatory Visit
Admission: RE | Admit: 2024-01-18 | Discharge: 2024-01-18 | Disposition: A | Discharge status: Home / Self Care | Source: Ambulatory Visit | Attending: Radiation Oncology | Admitting: Radiation Oncology

## 2024-01-18 DIAGNOSIS — C342 Malignant neoplasm of middle lobe, bronchus or lung: Secondary | ICD-10-CM | POA: Diagnosis not present

## 2024-01-18 LAB — RAD ONC ARIA SESSION SUMMARY
Course Elapsed Days: 16
Plan Fractions Treated to Date: 13
Plan Prescribed Dose Per Fraction: 2 Gy
Plan Total Fractions Prescribed: 35
Plan Total Prescribed Dose: 70 Gy
Reference Point Dosage Given to Date: 26 Gy
Reference Point Session Dosage Given: 2 Gy
Session Number: 13

## 2024-01-19 ENCOUNTER — Encounter: Payer: Self-pay | Admitting: Internal Medicine

## 2024-01-19 ENCOUNTER — Inpatient Hospital Stay (HOSPITAL_BASED_OUTPATIENT_CLINIC_OR_DEPARTMENT_OTHER): Admitting: Internal Medicine

## 2024-01-19 ENCOUNTER — Inpatient Hospital Stay: Payer: Self-pay

## 2024-01-19 ENCOUNTER — Inpatient Hospital Stay

## 2024-01-19 ENCOUNTER — Ambulatory Visit

## 2024-01-19 ENCOUNTER — Other Ambulatory Visit: Payer: Self-pay

## 2024-01-19 ENCOUNTER — Encounter: Payer: Self-pay | Admitting: *Deleted

## 2024-01-19 VITALS — BP 103/88 | HR 100 | Temp 98.0°F | Resp 20 | Wt 130.0 lb

## 2024-01-19 VITALS — BP 103/72 | HR 87

## 2024-01-19 DIAGNOSIS — Z5111 Encounter for antineoplastic chemotherapy: Secondary | ICD-10-CM | POA: Diagnosis not present

## 2024-01-19 DIAGNOSIS — C3491 Malignant neoplasm of unspecified part of right bronchus or lung: Secondary | ICD-10-CM

## 2024-01-19 DIAGNOSIS — C342 Malignant neoplasm of middle lobe, bronchus or lung: Secondary | ICD-10-CM | POA: Diagnosis not present

## 2024-01-19 LAB — RAD ONC ARIA SESSION SUMMARY
Course Elapsed Days: 17
Plan Fractions Treated to Date: 14
Plan Prescribed Dose Per Fraction: 2 Gy
Plan Total Fractions Prescribed: 35
Plan Total Prescribed Dose: 70 Gy
Reference Point Dosage Given to Date: 28 Gy
Reference Point Session Dosage Given: 2 Gy
Session Number: 14

## 2024-01-19 LAB — CMP (CANCER CENTER ONLY)
ALT: 100 U/L — ABNORMAL HIGH (ref 0–44)
AST: 84 U/L — ABNORMAL HIGH (ref 15–41)
Albumin: 3.1 g/dL — ABNORMAL LOW (ref 3.5–5.0)
Alkaline Phosphatase: 68 U/L (ref 38–126)
Anion gap: 8 (ref 5–15)
BUN: 14 mg/dL (ref 6–20)
CO2: 24 mmol/L (ref 22–32)
Calcium: 9.2 mg/dL (ref 8.9–10.3)
Chloride: 100 mmol/L (ref 98–111)
Creatinine: 0.55 mg/dL — ABNORMAL LOW (ref 0.61–1.24)
GFR, Estimated: >60 mL/min (ref >60)
Glucose, Bld: 84 mg/dL (ref 70–99)
Potassium: 4.1 mmol/L (ref 3.5–5.1)
Sodium: 132 mmol/L — ABNORMAL LOW (ref 135–145)
Total Bilirubin: 0.9 mg/dL (ref 0.0–1.2)
Total Protein: 7 g/dL (ref 6.5–8.1)

## 2024-01-19 LAB — CBC WITH DIFFERENTIAL (CANCER CENTER ONLY)
Abs Immature Granulocytes: 0.07 10*3/uL (ref 0.00–0.07)
Basophils Absolute: 0 10*3/uL (ref 0.0–0.1)
Basophils Relative: 0 %
Eosinophils Absolute: 0 10*3/uL (ref 0.0–0.5)
Eosinophils Relative: 0 %
HCT: 34.5 % — ABNORMAL LOW (ref 39.0–52.0)
Hemoglobin: 11 g/dL — ABNORMAL LOW (ref 13.0–17.0)
Immature Granulocytes: 2 %
Lymphocytes Relative: 12 %
Lymphs Abs: 0.6 10*3/uL — ABNORMAL LOW (ref 0.7–4.0)
MCH: 25.7 pg — ABNORMAL LOW (ref 26.0–34.0)
MCHC: 31.9 g/dL (ref 30.0–36.0)
MCV: 80.6 fL (ref 80.0–100.0)
Monocytes Absolute: 0.5 10*3/uL (ref 0.1–1.0)
Monocytes Relative: 10 %
Neutro Abs: 3.7 10*3/uL (ref 1.7–7.7)
Neutrophils Relative %: 76 %
Platelet Count: 219 10*3/uL (ref 150–400)
RBC: 4.28 MIL/uL (ref 4.22–5.81)
RDW: 15.3 % (ref 11.5–15.5)
WBC Count: 4.8 10*3/uL (ref 4.0–10.5)
nRBC: 0 % (ref 0.0–0.2)

## 2024-01-19 MED ORDER — DIPHENHYDRAMINE HCL 50 MG/ML IJ SOLN
50.0000 mg | Freq: Once | INTRAMUSCULAR | Status: AC
  Administered 2024-01-19: 50 mg via INTRAVENOUS
  Filled 2024-01-19: qty 1

## 2024-01-19 MED ORDER — PACLITAXEL CHEMO INJECTION 300 MG/50ML
45.0000 mg/m2 | Freq: Once | INTRAVENOUS | Status: AC
  Administered 2024-01-19: 78 mg via INTRAVENOUS
  Filled 2024-01-19: qty 13

## 2024-01-19 MED ORDER — SODIUM CHLORIDE 0.9 % IV SOLN
INTRAVENOUS | Status: DC
  Filled 2024-01-19: qty 250

## 2024-01-19 MED ORDER — SODIUM CHLORIDE 0.9 % IV SOLN
223.8000 mg | Freq: Once | INTRAVENOUS | Status: AC
  Administered 2024-01-19: 220 mg via INTRAVENOUS
  Filled 2024-01-19: qty 22

## 2024-01-19 MED ORDER — PANTOPRAZOLE SODIUM 40 MG PO TBEC
40.0000 mg | DELAYED_RELEASE_TABLET | Freq: Every day | ORAL | 0 refills | Status: DC
Start: 2024-01-19 — End: 2024-02-13

## 2024-01-19 MED ORDER — PALONOSETRON HCL INJECTION 0.25 MG/5ML
0.2500 mg | Freq: Once | INTRAVENOUS | Status: AC
Start: 2024-01-19 — End: 2024-01-19
  Administered 2024-01-19: 0.25 mg via INTRAVENOUS
  Filled 2024-01-19: qty 5

## 2024-01-19 MED ORDER — FAMOTIDINE IN NACL 20-0.9 MG/50ML-% IV SOLN
20.0000 mg | Freq: Once | INTRAVENOUS | Status: AC
  Administered 2024-01-19: 20 mg via INTRAVENOUS
  Filled 2024-01-19: qty 50

## 2024-01-19 MED ORDER — DEXAMETHASONE SODIUM PHOSPHATE 10 MG/ML IJ SOLN
10.0000 mg | Freq: Once | INTRAMUSCULAR | Status: AC
  Administered 2024-01-19: 10 mg via INTRAVENOUS
  Filled 2024-01-19: qty 1

## 2024-01-19 NOTE — Progress Notes (Signed)
 Patient still says that he is doing well, and treatments are going good. No new questions or concerns for the doctor today.

## 2024-01-19 NOTE — Progress Notes (Signed)
 Nicholas M[facility] CONSULT NOTE  Patient Care Team: Pardue, Monico Blitz, DO as PCP - General (Family Medicine) Nicholas Buff, RN as Oncology Nurse Navigator Nicholas Miller, MD as Consulting Physician (Radiation Oncology)  Reason for visit-stage IIIa lung adenocarcinoma  CANCER STAGING   Cancer Staging  Lung cancer [facility]) Staging form: Lung, AJCC V9 - Clinical: Stage IIIA (cT4, cN1, cM0) - Signed by Nicholas Barter, MD on 11/24/2023   ASSESSMENT & PLAN:  Nicholas Mccoy 58 y.o. male with pmh of alcohol use, hepatitis C, hyperlipidemia, hypertension referred to medical oncology for right lung mass.  # Right lung adenocarcinoma, stage IIIA  -PET scan showed 7.3 x 4.9 cm hypermetabolic large right lung mass SUV 23.1.  Prominent right hilar mass or lymph node with SUV 12.1 measures 3.4 x 2.9 cm.  No metastatic disease outside of the chest.  -Status post EBUS with biopsy showed lung adenocarcinoma from right lung upper lobe mass FNA and brushing.  Lymph node station 7, 11R superior and inferior negative for malignancy.  -Tempus testing was reviewed with no targetable mutations.  PD-L1 5%.  -MRI brain negative for metastatic disease.  -Continue with concurrent chemo RT.  Labs reviewed and acceptable for treatment.  Will proceed with cycle 3 of weekly carboplatin AUC 2 and Taxol 45 mg/m.  He will come in directly for cycle 4.  I will follow-up with cycle 5.  After completion of concurrent chemo RT, we discussed about starting maintenance Durvalumab every month for 1 year.  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  Will stop after his radiation.  # Active smoker -Discussed about smoking cessation.  Referral to smoking cessation program.  # Hepatitis C positive -Follows with Nicholas Mccoy.  He is not on active treatment.   No orders of the defined types were placed in this encounter.  RTC as scheduled.  The total  time spent in the appointment was 30 minutes encounter with patients including review of chart and various tests results, discussions about plan of care and coordination of care plan   All questions were answered. The patient knows to call the clinic with any problems, questions or concerns. No barriers to learning was detected.  Nicholas Barter, MD 4/10/20252:56 PM   HISTORY OF PRESENTING ILLNESS:  Nicholas Mccoy 58 y.o. male with pmh of alcohol use, hepatitis C, hyperlipidemia, hypertension referred to medical oncology for right lung mass.  Patient presented to [facility] ED on 10/25/2023 with complaints of chest pain.  CTA chest showed large enhancing mass in the right lung abutting the pleura appears predominantly located in the posterior aspect of right middle lobe measures 7 x 6.4 x 7 cm.  Right inferior hilar nodule measures 2.7 cm suspicious for nodal involvement.  Encases and severely narrows the right middle lobe bronchus with mass effect and displacement of right superior pulmonary vein.  Additional borderline enlarged right superior hilar lymph node 1 cm.  Patient is an active smoker for several years.  Smokes half to 1 pack a day.  Interval history Patient was seen today as follow-up on concurrent chemo RT for stage III lung cancer. Doing well overall.  Reports mild acid reflux which is manageable.  Denies any other concerns.  I have reviewed his chart and materials related to his cancer extensively and collaborated history with the patient. Summary of oncologic history is as follows: Oncology History  Lung cancer (HCC)  11/09/2023 PET scan  FINDINGS: Mediastinal blood pool activity: SUV max 1.6   Liver activity: SUV max NA   NECK: No significant abnormal hypermetabolic activity in this region.   Incidental CT findings: Mild chronic left maxillary sinusitis.   CHEST: Large right lung mass primarily centered in the right middle lobe although based on recent CT this is  likely to be crossing the minor fissure into the right upper lobe and potentially crossing the major fissure into the right lower lobe, maximum SUV 23.1, the associated hypermetabolic activity measures about 7.3 by 4.9 cm. The mass abuts the right pleural margin laterally without pleural effusion or separate pleural hypermetabolic activity.   There is also a prominent right hilar mass or lymph node with maximum SUV 12.1 measuring about 3.4 by 2.9 cm.   Postobstructive atelectasis in the right middle lobe.   Incidental CT findings: Scattered paraseptal bulla at the lung apices.   ABDOMEN/PELVIS: No significant abnormal hypermetabolic activity in this region.   Incidental CT findings: Small benign calcified lesion of the left posteroinferior spleen. Atherosclerosis is present, including aortoiliac atherosclerotic disease.   SKELETON: No significant abnormal hypermetabolic activity in this region.   Incidental CT findings: None.   IMPRESSION: 1. Large right lung mass primarily centered in the right middle lobe although based on recent CT this is likely crossing the minor fissure into the right upper lobe and potentially crossing the major fissure into the right lower lobe. 2. Prominent right hilar mass or lymph node with maximum SUV 12.1 measuring about 3.4 by 2.9 cm. 3. No findings of distant metastatic disease. 4. Mild chronic left maxillary sinusitis. 5. Scattered paraseptal bulla at the lung apices. 6. Aortic atherosclerosis.   11/17/2023 Procedure   S/p EBUS with biopsy with Dr. Larinda Mccoy.   FINAL MICROSCOPIC DIAGNOSIS:  A.  RIGHT LUNG, UPPER LOBE, MASS, FINE NEEDLE ASPIRATION  BIOPSY:  - Malignant  - Adenocarcinoma   B.  RIGHT LUNG, UPPER LOBE, MASS, BRUSHING:  - Malignant  - Adenocarcinoma   Specimen Submitted:  C. LUNG, RUL, LAVAGE:    FINAL MICROSCOPIC DIAGNOSIS:  - Atypical cells present  - Rare atypical large cells suspicious for tumor   FINAL MICROSCOPIC  DIAGNOSIS:  D. LYMPH NODE, STATION 7, FINE NEEDLE ASPIRATION:  - Negative for malignancy  - Compatible with benign lymph node   E. LYMPH NODE, STATION 11R SUPERIOR, FINE NEEDLE ASPIRATION:  - Negative for malignancy  - Compatible with a benign lymph node   F. LYMPH NODE, STATION 11R INFERIOR, FINE NEEDLE ASPIRATION:  - Negative for malignancy  - Compatible with a benign lymph node      11/24/2023 Cancer Staging   Staging form: Lung, AJCC V9 - Clinical: Stage IIIA (cT4, cN1, cM0) - Signed by Nicholas Barter, MD on 11/24/2023   12/08/2023 - 12/08/2023 Chemotherapy   Patient is on Treatment Plan : LUNG Carboplatin + Paclitaxel + XRT q7d     12/15/2023 - 12/15/2023 Chemotherapy   Patient is on Treatment Plan : LUNG Carboplatin (5) + Pemetrexed (500) + Pembrolizumab (200) D1 q21d Induction x 4 cycles / Maintenance Pemetrexed (500) + Pembrolizumab (200) D1 q21d     01/05/2024 -  Chemotherapy   Patient is on Treatment Plan : LUNG Carboplatin + Paclitaxel + XRT q7d       MEDICAL HISTORY:  Past Medical History:  Diagnosis Date   Alcohol abuse    Alcohol abuse, daily use 05/04/2023   Hepatitis C    Hyperlipidemia    Hypertension  Pneumonia    x several    SURGICAL HISTORY: Past Surgical History:  Procedure Laterality Date   BRONCHIAL BIOPSY  11/17/2023   Procedure: BRONCHIAL BIOPSIES;  Surgeon: Janann Colonel, MD;  Location: [facility] ENDOSCOPY;  Service: Pulmonary;;   BRONCHIAL BRUSHINGS  11/17/2023   Procedure: BRONCHIAL BRUSHINGS;  Surgeon: Janann Colonel, MD;  Location: [facility];  Service: Pulmonary;;   BRONCHIAL NEEDLE ASPIRATION BIOPSY  11/17/2023   Procedure: BRONCHIAL NEEDLE ASPIRATION BIOPSIES;  Surgeon: Janann Colonel, MD;  Location: [facility]OS[facility];  Service: Pulmonary;;   BRONCHIAL WASHINGS  11/17/2023   Procedure: BRONCHIAL WASHINGS;  Surgeon: Janann Colonel, MD;  Location: [facility] ENDOSCOPY;  Service: Pulmonary;;   COLONOSCOPY WITH PROPOFOL N/A 06/28/2023    Procedure: COLONOSCOPY WITH PROPOFOL;  Surgeon: Wyline Mood, MD;  Location: [facility] ENDOSCOPY;  Service: Gastroenterology;  Laterality: N/A;   ENDOBRONCHIAL ULTRASOUND Bilateral 11/17/2023   Procedure: ENDOBRONCHIAL ULTRASOUND;  Surgeon: Janann Colonel, MD;  Location: [facility] ENDOSCOPY;  Service: Pulmonary;  Laterality: Bilateral;   FINE NEEDLE ASPIRATION  11/17/2023   Procedure: FINE NEEDLE ASPIRATION (FNA) LINEAR;  Surgeon: Janann Colonel, MD;  Location: [facility] ENDOSCOPY;  Service: Pulmonary;;   HEMOSTASIS CONTROL  11/17/2023   Procedure: HEMOSTASIS CONTROL;  Surgeon: Janann Colonel, MD;  Location: [facility] ENDOSCOPY;  Service: Pulmonary;;   IR IMAGING GUIDED PORT INSERTION  12/13/2023   POLYPECTOMY  06/28/2023   Procedure: POLYPECTOMY;  Surgeon: Wyline Mood, MD;  Location: [facility] ENDOSCOPY;  Service: Gastroenterology;;   VIDEO BRONCHOSCOPY WITH RADIAL ENDOBRONCHIAL ULTRASOUND  11/17/2023   Procedure: VIDEO BRONCHOSCOPY WITH RADIAL ENDOBRONCHIAL ULTRASOUND;  Surgeon: Janann Colonel, MD;  Location: [facility] ENDOSCOPY;  Service: Pulmonary;;    SOCIAL HISTORY: Social History   Socioeconomic History   Marital status: Divorced    Spouse name: Not on file   Number of children: Not on file   Years of education: Not on file   Highest education level: 10th grade  Occupational History   Not on file  Tobacco Use   Smoking status: Every Day    Current packs/day: 0.50    Average packs/day: 0.5 packs/day for 25.0 years (12.5 ttl pk-yrs)    Types: Cigarettes   Smokeless tobacco: Never   Tobacco comments:    Started smoking at 58 years old.    Smoked 1 PPD at his heaviest.    Now smokes 3 cigarettes. Khj 11/09/2023  Vaping Use   Vaping status: Never Used  Substance and Sexual Activity   Alcohol use: Not Currently    Comment: stopped drinking 11/2017   Drug use: No   Sexual activity: Yes  Other Topics Concern   Not on file  Social History Narrative   Not on file   Social Drivers of Health    Financial Resource Strain: Low Risk  (07/25/2023)   Overall Financial Resource Strain (CARDIA)    Difficulty of Paying Living Expenses: Not very hard  Food Insecurity: No Food Insecurity (11/03/2023)   Hunger Vital Sign    Worried About Running Out of Food in the Last Year: Never true    Ran Out of Food in the Last Year: Never true  Transportation Needs: No Transportation Needs (07/25/2023)   PRAPARE - Administrator, Civil Service (Medical): No    Lack of Transportation (Non-Medical): No  Physical Activity: Insufficiently Active (07/25/2023)   Exercise Vital Sign    Days of Exercise per Week: 5 days    Minutes of Exercise per Session: 10 min  Stress: No Stress Concern Present (07/25/2023)  [employer] of Occupational Health - Occupational Stress Questionnaire    Feeling of Stress : Not at all  Social Connections: Moderately Isolated (07/25/2023)   Social Connection and Isolation Panel [NHANES]    Frequency of Communication with Friends and Family: Three times a week    Frequency of Social Gatherings with Friends and Family: Once a week    Attends Religious Services: More than 4 times per year    Active Member of [employer] or Organizations: No    Attends Engineer, structural: Not on file    Marital Status: Divorced  Intimate Partner Violence: Not At Risk (11/03/2023)   Humiliation, Afraid, Rape, and Kick questionnaire    Fear of Current or Ex-Partner: No    Emotionally Abused: No    Physically Abused: No    Sexually Abused: No    FAMILY HISTORY: Family History  Problem Relation Age of Onset   Hypertension Father    Heart disease Father    Heart attack Father    Heart attack Sister    Hypertension Brother    Healthy Brother    Sickle cell anemia Sister    Hypertension Mother    Hypertension Maternal Grandmother    Hypertension Maternal Grandfather    Hypertension Paternal Grandmother    Hypertension Paternal Grandfather     ALLERGIES:  has no  known allergies.  MEDICATIONS:  Current Outpatient Medications  Medication Sig Dispense Refill   amLODipine (NORVASC) 5 MG tablet TAKE 1 TABLET (5 MG TOTAL) BY MOUTH DAILY. 30 tablet 0   dexamethasone (DECADRON) 4 MG tablet Take 2 tablets daily for 2 days, start the day after chemotherapy. Take with food. 30 tablet 1   lidocaine-prilocaine (EMLA) cream Apply to affected area once 30 g 3   ondansetron (ZOFRAN) 8 MG tablet Take 1 tablet (8 mg total) by mouth every 8 (eight) hours as needed for nausea or vomiting. Start on the third day after chemotherapy. 30 tablet 1   pantoprazole (PROTONIX) 40 MG tablet Take 1 tablet (40 mg total) by mouth daily. 30 tablet 0   prochlorperazine (COMPAZINE) 10 MG tablet Take 1 tablet (10 mg total) by mouth every 6 (six) hours as needed for nausea or vomiting. 30 tablet 1   rosuvastatin (CRESTOR) 5 MG tablet TAKE 1 TABLET (5 MG TOTAL) BY MOUTH DAILY. (Patient taking differently: Take 5 mg by mouth at bedtime.) 90 tablet 1   varenicline (CHANTIX) 0.5 MG tablet Days 1 to 3- 0.5 mg once daily, Days 4 to 7-0.5 mg twice daily, Maintenance day 8 and later 1 mg twice daily. If side effects, reduce dose to previously tolerated dose. 60 tablet 0   No current facility-administered medications for this visit.   Facility-Administered Medications Ordered in Other Visits  Medication Dose Route Frequency Provider Last Rate Last Admin   0.9 %  sodium chloride infusion   Intravenous Continuous Nicholas Barter, MD 10 mL/hr at 01/19/24 1442 New Bag at 01/19/24 1442   CARBOplatin (PARAPLATIN) 220 mg in sodium chloride 0.9 % 100 mL chemo infusion  220 mg Intravenous Once Nicholas Barter, MD       famotidine (PEPCID) IVPB 20 mg premix  20 mg Intravenous Once Nicholas Barter, MD 200 mL/hr at 01/19/24 1446 20 mg at 01/19/24 1446   PACLitaxel (TAXOL) 78 mg in sodium chloride 0.9 % 250 mL chemo infusion (</= 80mg /m2)  45 mg/m2 (Treatment Plan Recorded) Intravenous Once Nicholas Barter, MD         REVIEW OF  SYSTEMS:   Pertinent information mentioned in HPI All other systems were reviewed with the patient and are negative.  PHYSICAL EXAMINATION: ECOG PERFORMANCE STATUS: 1 - Symptomatic but completely ambulatory  Vitals:   01/19/24 1358  BP: 103/88  Pulse: 100  Resp: 20  Temp: 98 F (36.7 C)  SpO2: 100%    Filed Weights   01/19/24 1358  Weight: 130 lb (59 kg)     GENERAL:alert, no distress and comfortable SKIN: skin color, texture, turgor are normal, no rashes or significant lesions EYES: normal, conjunctiva are pink and non-injected, sclera clear OROPHARYNX:no exudate, no erythema and lips, buccal mucosa, and tongue normal  NECK: supple, thyroid normal size, non-tender, without nodularity LYMPH:  no palpable lymphadenopathy in the cervical, axillary or inguinal LUNGS: clear to auscultation and percussion with normal breathing effort HEART: regular rate & rhythm and no murmurs and no lower extremity edema ABDOMEN:abdomen soft, non-tender and normal bowel sounds Musculoskeletal:no cyanosis of digits and no clubbing  PSYCH: alert & oriented x 3 with fluent speech NEURO: no focal motor/sensory deficits  LABORATORY DATA:  I have reviewed the data as listed Lab Results  Component Value Date   WBC 4.8 01/19/2024   HGB 11.0 (L) 01/19/2024   HCT 34.5 (L) 01/19/2024   MCV 80.6 01/19/2024   PLT 219 01/19/2024   Recent Labs    01/05/24 0952 01/12/24 1114 01/19/24 1325  NA 134* 135 132*  K 3.8 4.4 4.1  CL 98 102 100  CO2 24 25 24   GLUCOSE 109* 127* 84  BUN 10 13 14   CREATININE 0.71 0.61 0.55*  CALCIUM 9.3 9.3 9.2  GFRNONAA >60 >60 >60  PROT 8.2* 7.3 7.0  ALBUMIN 3.0* 3.0* 3.1*  AST 135* 64* 84*  ALT 97* 83* 100*  ALKPHOS 81 82 68  BILITOT 0.9 0.8 0.9    RADIOGRAPHIC STUDIES: I have personally reviewed the radiological images as listed and agreed with the findings in the report. No results found.

## 2024-01-19 NOTE — Patient Instructions (Signed)
 [facility] - A DEPT OF [facility]  Discharge Instructions: Thank you for choosing [facility] to provide your oncology and hematology care.  If you have a lab appointment with the Cancer Center, please go directly to the Cancer Center and check in at the registration area.  Wear comfortable clothing and clothing appropriate for easy access to any Portacath or PICC line.   We strive to give you quality time with your provider. You may need to reschedule your appointment if you arrive late (15 or more minutes).  Arriving late affects you and other patients whose appointments are after yours.  Also, if you miss three or more appointments without notifying the office, you may be dismissed from the clinic at the provider's discretion.      For prescription refill requests, have your pharmacy contact our office and allow 72 hours for refills to be completed.    Today you received the following chemotherapy and/or immunotherapy agents Taxol & Carboplatin      To help prevent nausea and vomiting after your treatment, we encourage you to take your nausea medication as directed.  BELOW ARE SYMPTOMS THAT SHOULD BE REPORTED IMMEDIATELY: *FEVER GREATER THAN 100.4 F (38 C) OR HIGHER *CHILLS OR SWEATING *NAUSEA AND VOMITING THAT IS NOT CONTROLLED WITH YOUR NAUSEA MEDICATION *UNUSUAL SHORTNESS OF BREATH *UNUSUAL BRUISING OR BLEEDING *URINARY PROBLEMS (pain or burning when urinating, or frequent urination) *BOWEL PROBLEMS (unusual diarrhea, constipation, pain near the anus) TENDERNESS IN MOUTH AND THROAT WITH OR WITHOUT PRESENCE OF ULCERS (sore throat, sores in mouth, or a toothache) UNUSUAL RASH, SWELLING OR PAIN  UNUSUAL VAGINAL DISCHARGE OR ITCHING   Items with * indicate a potential emergency and should be followed up as soon as possible or go to the Emergency Department if any problems should occur.  Please show the CHEMOTHERAPY ALERT CARD or  IMMUNOTHERAPY ALERT CARD at check-in to the Emergency Department and triage nurse.  Should you have questions after your visit or need to cancel or reschedule your appointment, please contact [facility] - A DEPT OF [facility]  [phone] and follow the prompts.  Office hours are 8:00 a.m. to 4:30 p.m. Monday - Friday. Please note that voicemails left after 4:00 p.m. may not be returned until the following business day.  We are closed weekends and major holidays. You have access to a nurse at all times for urgent questions. Please call the main number to the clinic [phone] and follow the prompts.  For any non-urgent questions, you may also contact your provider using MyChart. We now offer e-Visits for anyone 67 and older to request care online for non-urgent symptoms. For details visit [email or web address].   Also download the MyChart app! Go to the app store, search "MyChart", open the app, select [facility], and log in with your MyChart username and password.

## 2024-01-20 ENCOUNTER — Other Ambulatory Visit: Payer: Self-pay

## 2024-01-20 ENCOUNTER — Ambulatory Visit
Admission: RE | Admit: 2024-01-20 | Discharge: 2024-01-20 | Discharge status: Home / Self Care | Source: Ambulatory Visit | Attending: Radiation Oncology | Admitting: Radiation Oncology

## 2024-01-20 DIAGNOSIS — C342 Malignant neoplasm of middle lobe, bronchus or lung: Secondary | ICD-10-CM | POA: Diagnosis not present

## 2024-01-20 LAB — RAD ONC ARIA SESSION SUMMARY
Course Elapsed Days: 18
Plan Fractions Treated to Date: 15
Plan Prescribed Dose Per Fraction: 2 Gy
Plan Total Fractions Prescribed: 35
Plan Total Prescribed Dose: 70 Gy
Reference Point Dosage Given to Date: 30 Gy
Reference Point Session Dosage Given: 2 Gy
Session Number: 15

## 2024-01-23 ENCOUNTER — Other Ambulatory Visit: Payer: Self-pay

## 2024-01-23 ENCOUNTER — Ambulatory Visit
Admission: RE | Admit: 2024-01-23 | Discharge: 2024-01-23 | Disposition: A | Discharge status: Home / Self Care | Source: Ambulatory Visit | Attending: Radiation Oncology | Admitting: Radiation Oncology

## 2024-01-23 ENCOUNTER — Encounter: Payer: Self-pay | Admitting: Internal Medicine

## 2024-01-23 DIAGNOSIS — C342 Malignant neoplasm of middle lobe, bronchus or lung: Secondary | ICD-10-CM | POA: Diagnosis not present

## 2024-01-23 LAB — RAD ONC ARIA SESSION SUMMARY
Course Elapsed Days: 21
Plan Fractions Treated to Date: 16
Plan Prescribed Dose Per Fraction: 2 Gy
Plan Total Fractions Prescribed: 35
Plan Total Prescribed Dose: 70 Gy
Reference Point Dosage Given to Date: 32 Gy
Reference Point Session Dosage Given: 2 Gy
Session Number: 16

## 2024-01-24 ENCOUNTER — Ambulatory Visit
Admission: RE | Admit: 2024-01-24 | Discharge: 2024-01-24 | Disposition: A | Discharge status: Home / Self Care | Source: Ambulatory Visit | Attending: Radiation Oncology | Admitting: Radiation Oncology

## 2024-01-24 ENCOUNTER — Other Ambulatory Visit: Payer: Self-pay

## 2024-01-24 DIAGNOSIS — C342 Malignant neoplasm of middle lobe, bronchus or lung: Secondary | ICD-10-CM | POA: Diagnosis not present

## 2024-01-24 LAB — RAD ONC ARIA SESSION SUMMARY
Course Elapsed Days: 22
Plan Fractions Treated to Date: 17
Plan Prescribed Dose Per Fraction: 2 Gy
Plan Total Fractions Prescribed: 35
Plan Total Prescribed Dose: 70 Gy
Reference Point Dosage Given to Date: 34 Gy
Reference Point Session Dosage Given: 2 Gy
Session Number: 17

## 2024-01-25 ENCOUNTER — Other Ambulatory Visit: Payer: Self-pay

## 2024-01-25 ENCOUNTER — Ambulatory Visit
Admission: RE | Admit: 2024-01-25 | Discharge: 2024-01-25 | Disposition: A | Discharge status: Home / Self Care | Source: Ambulatory Visit | Attending: Radiation Oncology | Admitting: Radiation Oncology

## 2024-01-25 DIAGNOSIS — C342 Malignant neoplasm of middle lobe, bronchus or lung: Secondary | ICD-10-CM | POA: Diagnosis not present

## 2024-01-25 LAB — RAD ONC ARIA SESSION SUMMARY
Course Elapsed Days: 23
Plan Fractions Treated to Date: 18
Plan Prescribed Dose Per Fraction: 2 Gy
Plan Total Fractions Prescribed: 35
Plan Total Prescribed Dose: 70 Gy
Reference Point Dosage Given to Date: 36 Gy
Reference Point Session Dosage Given: 2 Gy
Session Number: 18

## 2024-01-26 ENCOUNTER — Inpatient Hospital Stay

## 2024-01-26 ENCOUNTER — Ambulatory Visit
Admission: RE | Admit: 2024-01-26 | Discharge: 2024-01-26 | Disposition: A | Discharge status: Home / Self Care | Source: Ambulatory Visit | Attending: Radiation Oncology | Admitting: Radiation Oncology

## 2024-01-26 ENCOUNTER — Other Ambulatory Visit: Payer: Self-pay

## 2024-01-26 VITALS — BP 99/77 | HR 89 | Temp 97.9°F | Resp 18 | Wt 132.3 lb

## 2024-01-26 DIAGNOSIS — C342 Malignant neoplasm of middle lobe, bronchus or lung: Secondary | ICD-10-CM | POA: Diagnosis not present

## 2024-01-26 DIAGNOSIS — C3491 Malignant neoplasm of unspecified part of right bronchus or lung: Secondary | ICD-10-CM

## 2024-01-26 LAB — RAD ONC ARIA SESSION SUMMARY
Course Elapsed Days: 24
Plan Fractions Treated to Date: 19
Plan Prescribed Dose Per Fraction: 2 Gy
Plan Total Fractions Prescribed: 35
Plan Total Prescribed Dose: 70 Gy
Reference Point Dosage Given to Date: 38 Gy
Reference Point Session Dosage Given: 2 Gy
Session Number: 19

## 2024-01-26 LAB — CMP (CANCER CENTER ONLY)
ALT: 66 U/L — ABNORMAL HIGH (ref 0–44)
AST: 58 U/L — ABNORMAL HIGH (ref 15–41)
Albumin: 2.9 g/dL — ABNORMAL LOW (ref 3.5–5.0)
Alkaline Phosphatase: 67 U/L (ref 38–126)
Anion gap: 6 (ref 5–15)
BUN: 12 mg/dL (ref 6–20)
CO2: 24 mmol/L (ref 22–32)
Calcium: 8.9 mg/dL (ref 8.9–10.3)
Chloride: 106 mmol/L (ref 98–111)
Creatinine: 0.68 mg/dL (ref 0.61–1.24)
GFR, Estimated: >60 mL/min (ref >60)
Glucose, Bld: 130 mg/dL — ABNORMAL HIGH (ref 70–99)
Potassium: 4 mmol/L (ref 3.5–5.1)
Sodium: 136 mmol/L (ref 135–145)
Total Bilirubin: 0.7 mg/dL (ref 0.0–1.2)
Total Protein: 6.3 g/dL — ABNORMAL LOW (ref 6.5–8.1)

## 2024-01-26 LAB — CBC WITH DIFFERENTIAL (CANCER CENTER ONLY)
Abs Immature Granulocytes: 0.07 10*3/uL (ref 0.00–0.07)
Basophils Absolute: 0 10*3/uL (ref 0.0–0.1)
Basophils Relative: 0 %
Eosinophils Absolute: 0 10*3/uL (ref 0.0–0.5)
Eosinophils Relative: 0 %
HCT: 35.5 % — ABNORMAL LOW (ref 39.0–52.0)
Hemoglobin: 11.3 g/dL — ABNORMAL LOW (ref 13.0–17.0)
Immature Granulocytes: 2 %
Lymphocytes Relative: 9 %
Lymphs Abs: 0.4 10*3/uL — ABNORMAL LOW (ref 0.7–4.0)
MCH: 26.1 pg (ref 26.0–34.0)
MCHC: 31.8 g/dL (ref 30.0–36.0)
MCV: 82 fL (ref 80.0–100.0)
Monocytes Absolute: 0.3 10*3/uL (ref 0.1–1.0)
Monocytes Relative: 7 %
Neutro Abs: 3.2 10*3/uL (ref 1.7–7.7)
Neutrophils Relative %: 82 %
Platelet Count: 183 10*3/uL (ref 150–400)
RBC: 4.33 MIL/uL (ref 4.22–5.81)
RDW: 17.2 % — ABNORMAL HIGH (ref 11.5–15.5)
WBC Count: 3.9 10*3/uL — ABNORMAL LOW (ref 4.0–10.5)
nRBC: 0 % (ref 0.0–0.2)

## 2024-01-26 MED ORDER — SODIUM CHLORIDE 0.9 % IV SOLN
INTRAVENOUS | Status: DC
  Filled 2024-01-26: qty 250

## 2024-01-26 MED ORDER — SODIUM CHLORIDE 0.9 % IV SOLN
223.8000 mg | Freq: Once | INTRAVENOUS | Status: AC
  Administered 2024-01-26: 220 mg via INTRAVENOUS
  Filled 2024-01-26: qty 22

## 2024-01-26 MED ORDER — FAMOTIDINE IN NACL 20-0.9 MG/50ML-% IV SOLN
20.0000 mg | Freq: Once | INTRAVENOUS | Status: AC
  Administered 2024-01-26: 20 mg via INTRAVENOUS
  Filled 2024-01-26: qty 50

## 2024-01-26 MED ORDER — HEPARIN SOD (PORK) LOCK FLUSH 100 UNIT/ML IV SOLN
500.0000 [IU] | Freq: Once | INTRAVENOUS | Status: DC | PRN
  Filled 2024-01-26: qty 5

## 2024-01-26 MED ORDER — DIPHENHYDRAMINE HCL 50 MG/ML IJ SOLN
50.0000 mg | Freq: Once | INTRAMUSCULAR | Status: AC
  Administered 2024-01-26: 50 mg via INTRAVENOUS
  Filled 2024-01-26: qty 1

## 2024-01-26 MED ORDER — PALONOSETRON HCL INJECTION 0.25 MG/5ML
0.2500 mg | Freq: Once | INTRAVENOUS | Status: AC
  Administered 2024-01-26: 0.25 mg via INTRAVENOUS
  Filled 2024-01-26: qty 5

## 2024-01-26 MED ORDER — SODIUM CHLORIDE 0.9 % IV SOLN
45.0000 mg/m2 | Freq: Once | INTRAVENOUS | Status: AC
  Administered 2024-01-26: 78 mg via INTRAVENOUS
  Filled 2024-01-26: qty 13

## 2024-01-26 MED ORDER — DEXAMETHASONE SODIUM PHOSPHATE 10 MG/ML IJ SOLN
10.0000 mg | Freq: Once | INTRAMUSCULAR | Status: AC
  Administered 2024-01-26: 10 mg via INTRAVENOUS
  Filled 2024-01-26: qty 1

## 2024-01-26 NOTE — Progress Notes (Signed)
 Nutrition Follow-up:  Patient with stage III lung adenocarcinoma.  Patient receiving concurrent chemotherapy and radiation.    Met with patient during infusion.  Reports that his appetite is good.  Has been eating meats, peanut butter, eggs, peanut butter, vegetables.  Drinking SF beverages, gatorade zero, milk, water.  Drinking boost plus shakes.  Family is helping him buy these shakes.  Reports that he has not had to take his reflux medication (protonix)    Medications: protonix  Labs: reviewed  Anthropometrics:   Weight 132 lb 4.8 oz today  128 lb on 4/3 131 lb 4.8 oz on 3/4 140 lb Jan 2025   NUTRITION DIAGNOSIS: Unintentional weight loss improving   MALNUTRITION DIAGNOSIS: severe malnutrition improving   INTERVENTION:  Continue high calorie oral nutrition supplement Continue foods rich in protein along with plant foods.   Has protonix on hand if needed for reflux.  Trying not to eat close to bedtime    MONITORING, EVALUATION, GOAL: weight trends, intake   NEXT VISIT: Thursday, May 1 during infusion  Nicholas Mccoy, CSO, LDN Registered Dietitian [phone]

## 2024-01-27 ENCOUNTER — Other Ambulatory Visit: Payer: Self-pay

## 2024-01-27 ENCOUNTER — Ambulatory Visit
Admission: RE | Admit: 2024-01-27 | Discharge: 2024-01-27 | Disposition: A | Discharge status: Home / Self Care | Source: Ambulatory Visit | Attending: Radiation Oncology | Admitting: Radiation Oncology

## 2024-01-27 DIAGNOSIS — C342 Malignant neoplasm of middle lobe, bronchus or lung: Secondary | ICD-10-CM | POA: Diagnosis not present

## 2024-01-27 LAB — RAD ONC ARIA SESSION SUMMARY
Course Elapsed Days: 25
Plan Fractions Treated to Date: 20
Plan Prescribed Dose Per Fraction: 2 Gy
Plan Total Fractions Prescribed: 35
Plan Total Prescribed Dose: 70 Gy
Reference Point Dosage Given to Date: 40 Gy
Reference Point Session Dosage Given: 2 Gy
Session Number: 20

## 2024-01-30 ENCOUNTER — Other Ambulatory Visit: Payer: Self-pay

## 2024-01-30 ENCOUNTER — Ambulatory Visit
Admission: RE | Admit: 2024-01-30 | Discharge: 2024-01-30 | Disposition: A | Discharge status: Home / Self Care | Source: Ambulatory Visit | Attending: Radiation Oncology | Admitting: Radiation Oncology

## 2024-01-30 DIAGNOSIS — C342 Malignant neoplasm of middle lobe, bronchus or lung: Secondary | ICD-10-CM | POA: Diagnosis not present

## 2024-01-30 LAB — RAD ONC ARIA SESSION SUMMARY
Course Elapsed Days: 28
Plan Fractions Treated to Date: 21
Plan Prescribed Dose Per Fraction: 2 Gy
Plan Total Fractions Prescribed: 35
Plan Total Prescribed Dose: 70 Gy
Reference Point Dosage Given to Date: 42 Gy
Reference Point Session Dosage Given: 2 Gy
Session Number: 21

## 2024-01-31 ENCOUNTER — Ambulatory Visit
Admission: RE | Admit: 2024-01-31 | Discharge: 2024-01-31 | Disposition: A | Discharge status: Home / Self Care | Source: Ambulatory Visit | Attending: Radiation Oncology | Admitting: Radiation Oncology

## 2024-01-31 ENCOUNTER — Other Ambulatory Visit: Payer: Self-pay

## 2024-01-31 DIAGNOSIS — C342 Malignant neoplasm of middle lobe, bronchus or lung: Secondary | ICD-10-CM | POA: Diagnosis not present

## 2024-01-31 LAB — RAD ONC ARIA SESSION SUMMARY
Course Elapsed Days: 29
Plan Fractions Treated to Date: 22
Plan Prescribed Dose Per Fraction: 2 Gy
Plan Total Fractions Prescribed: 35
Plan Total Prescribed Dose: 70 Gy
Reference Point Dosage Given to Date: 44 Gy
Reference Point Session Dosage Given: 2 Gy
Session Number: 22

## 2024-01-31 NOTE — Addendum Note (Signed)
 Encounter addended by: Karolynn Pack on: 01/31/2024 8:07 AM  Actions taken: Imaging Exam ended

## 2024-02-01 ENCOUNTER — Other Ambulatory Visit: Payer: Self-pay

## 2024-02-01 ENCOUNTER — Ambulatory Visit
Admission: RE | Admit: 2024-02-01 | Discharge: 2024-02-01 | Disposition: A | Discharge status: Home / Self Care | Source: Ambulatory Visit | Attending: Radiation Oncology | Admitting: Radiation Oncology

## 2024-02-01 DIAGNOSIS — C342 Malignant neoplasm of middle lobe, bronchus or lung: Secondary | ICD-10-CM | POA: Diagnosis not present

## 2024-02-01 LAB — RAD ONC ARIA SESSION SUMMARY
Course Elapsed Days: 30
Plan Fractions Treated to Date: 23
Plan Prescribed Dose Per Fraction: 2 Gy
Plan Total Fractions Prescribed: 35
Plan Total Prescribed Dose: 70 Gy
Reference Point Dosage Given to Date: 46 Gy
Reference Point Session Dosage Given: 2 Gy
Session Number: 23

## 2024-02-02 ENCOUNTER — Other Ambulatory Visit: Payer: Self-pay

## 2024-02-02 ENCOUNTER — Inpatient Hospital Stay

## 2024-02-02 ENCOUNTER — Inpatient Hospital Stay (HOSPITAL_BASED_OUTPATIENT_CLINIC_OR_DEPARTMENT_OTHER): Admitting: Internal Medicine

## 2024-02-02 ENCOUNTER — Encounter: Payer: Self-pay | Admitting: Internal Medicine

## 2024-02-02 ENCOUNTER — Ambulatory Visit
Admission: RE | Admit: 2024-02-02 | Discharge: 2024-02-02 | Disposition: A | Discharge status: Home / Self Care | Source: Ambulatory Visit | Attending: Radiation Oncology | Admitting: Radiation Oncology

## 2024-02-02 VITALS — BP 118/78 | Temp 99.1°F | Resp 18 | Wt 135.0 lb

## 2024-02-02 VITALS — BP 111/81 | HR 103 | Temp 98.9°F | Resp 19

## 2024-02-02 DIAGNOSIS — C3491 Malignant neoplasm of unspecified part of right bronchus or lung: Secondary | ICD-10-CM

## 2024-02-02 DIAGNOSIS — Z5111 Encounter for antineoplastic chemotherapy: Secondary | ICD-10-CM

## 2024-02-02 DIAGNOSIS — C342 Malignant neoplasm of middle lobe, bronchus or lung: Secondary | ICD-10-CM | POA: Diagnosis not present

## 2024-02-02 DIAGNOSIS — Z720 Tobacco use: Secondary | ICD-10-CM | POA: Diagnosis not present

## 2024-02-02 LAB — RAD ONC ARIA SESSION SUMMARY
Course Elapsed Days: 31
Plan Fractions Treated to Date: 24
Plan Prescribed Dose Per Fraction: 2 Gy
Plan Total Fractions Prescribed: 35
Plan Total Prescribed Dose: 70 Gy
Reference Point Dosage Given to Date: 48 Gy
Reference Point Session Dosage Given: 2 Gy
Session Number: 24

## 2024-02-02 LAB — CBC WITH DIFFERENTIAL (CANCER CENTER ONLY)
Abs Immature Granulocytes: 0.13 10*3/uL — ABNORMAL HIGH (ref 0.00–0.07)
Basophils Absolute: 0 10*3/uL (ref 0.0–0.1)
Basophils Relative: 0 %
Eosinophils Absolute: 0 10*3/uL (ref 0.0–0.5)
Eosinophils Relative: 0 %
HCT: 35.6 % — ABNORMAL LOW (ref 39.0–52.0)
Hemoglobin: 11.3 g/dL — ABNORMAL LOW (ref 13.0–17.0)
Immature Granulocytes: 3 %
Lymphocytes Relative: 9 %
Lymphs Abs: 0.4 10*3/uL — ABNORMAL LOW (ref 0.7–4.0)
MCH: 26.2 pg (ref 26.0–34.0)
MCHC: 31.7 g/dL (ref 30.0–36.0)
MCV: 82.6 fL (ref 80.0–100.0)
Monocytes Absolute: 0.3 10*3/uL (ref 0.1–1.0)
Monocytes Relative: 7 %
Neutro Abs: 3.2 10*3/uL (ref 1.7–7.7)
Neutrophils Relative %: 81 %
Platelet Count: 144 10*3/uL — ABNORMAL LOW (ref 150–400)
RBC: 4.31 MIL/uL (ref 4.22–5.81)
RDW: 19.3 % — ABNORMAL HIGH (ref 11.5–15.5)
WBC Count: 4 10*3/uL (ref 4.0–10.5)
nRBC: 0.5 % — ABNORMAL HIGH (ref 0.0–0.2)

## 2024-02-02 LAB — CMP (CANCER CENTER ONLY)
ALT: 73 U/L — ABNORMAL HIGH (ref 0–44)
AST: 57 U/L — ABNORMAL HIGH (ref 15–41)
Albumin: 3 g/dL — ABNORMAL LOW (ref 3.5–5.0)
Alkaline Phosphatase: 67 U/L (ref 38–126)
Anion gap: 7 (ref 5–15)
BUN: 10 mg/dL (ref 6–20)
CO2: 24 mmol/L (ref 22–32)
Calcium: 8.8 mg/dL — ABNORMAL LOW (ref 8.9–10.3)
Chloride: 103 mmol/L (ref 98–111)
Creatinine: 0.63 mg/dL (ref 0.61–1.24)
GFR, Estimated: >60 mL/min (ref >60)
Glucose, Bld: 87 mg/dL (ref 70–99)
Potassium: 4 mmol/L (ref 3.5–5.1)
Sodium: 134 mmol/L — ABNORMAL LOW (ref 135–145)
Total Bilirubin: 0.6 mg/dL (ref 0.0–1.2)
Total Protein: 6.5 g/dL (ref 6.5–8.1)

## 2024-02-02 MED ORDER — DEXAMETHASONE SODIUM PHOSPHATE 10 MG/ML IJ SOLN
10.0000 mg | Freq: Once | INTRAMUSCULAR | Status: AC
  Administered 2024-02-02: 10 mg via INTRAVENOUS
  Filled 2024-02-02: qty 1

## 2024-02-02 MED ORDER — DIPHENHYDRAMINE HCL 50 MG/ML IJ SOLN
50.0000 mg | Freq: Once | INTRAMUSCULAR | Status: AC
Start: 2024-02-02 — End: 2024-02-02
  Administered 2024-02-02: 50 mg via INTRAVENOUS
  Filled 2024-02-02: qty 1

## 2024-02-02 MED ORDER — SODIUM CHLORIDE 0.9 % IV SOLN
223.8000 mg | Freq: Once | INTRAVENOUS | Status: AC
  Administered 2024-02-02: 220 mg via INTRAVENOUS
  Filled 2024-02-02: qty 22

## 2024-02-02 MED ORDER — SODIUM CHLORIDE 0.9 % IV SOLN
45.0000 mg/m2 | Freq: Once | INTRAVENOUS | Status: AC
  Administered 2024-02-02: 78 mg via INTRAVENOUS
  Filled 2024-02-02: qty 13

## 2024-02-02 MED ORDER — FAMOTIDINE IN NACL 20-0.9 MG/50ML-% IV SOLN
20.0000 mg | Freq: Once | INTRAVENOUS | Status: AC
  Administered 2024-02-02: 20 mg via INTRAVENOUS
  Filled 2024-02-02: qty 50

## 2024-02-02 MED ORDER — SODIUM CHLORIDE 0.9 % IV SOLN
INTRAVENOUS | Status: DC
  Filled 2024-02-02: qty 250

## 2024-02-02 MED ORDER — HEPARIN SOD (PORK) LOCK FLUSH 100 UNIT/ML IV SOLN
500.0000 [IU] | Freq: Once | INTRAVENOUS | Status: AC | PRN
  Administered 2024-02-02: 500 [IU]
  Filled 2024-02-02: qty 5

## 2024-02-02 MED ORDER — PALONOSETRON HCL INJECTION 0.25 MG/5ML
0.2500 mg | Freq: Once | INTRAVENOUS | Status: AC
  Administered 2024-02-02: 0.25 mg via INTRAVENOUS
  Filled 2024-02-02: qty 5

## 2024-02-02 NOTE — Progress Notes (Signed)
 Patient is doing good, no new questions for the doctor today. He does want to try to find a new PCP provider, he isn't happy with the provider he has, so I gave him a print out of the closest PCP providers and told him to send me a mychart message and I would send a referral for him. He was going to try the chantix  but it was like $500.00.

## 2024-02-02 NOTE — Patient Instructions (Signed)
    Smoking / Nicotine Cessation Resources Smoking CessationCongratulations on your interest in reducing nicotine dependence. Support is available if you want to quit smoking, vaping, chewing, or dipping!  Find a program below that suits you best:  When you want to quit. How you need support. Where you live. How you like to learn. Start Today If you're ready to quit TODAY, our virtual care team is available to start your journey to a tobacco free life. Appointments are available from 8 a.m. to 8 p.m. Monday to Friday. Most health insurance plans will cover some level of tobacco cessation visits and medications.  Schedule A Virtual Visit  Additional Resources National and state programs are also available to help you quit & provide the support you'll need. Many programs are available in [place] and [place] and have a long history of successfully assisting people to quit tobacco.  FREE Telephonic Coaching: Go to Quitline[place] , Call 1-800-QUIT-NOW (587-789-8751); or Text: Ready to 34191. Quitline [place] has teamed up with Medicaid to offer a free 14-week program Quit Smoking Apps: QuitSTART, QuitGuide Online education and resources: Smokefree Support to [employer] Vaping: Want to Quit? Free 24/7 support- Call 1-800-QUIT-NOW Concerned about a teen who vapes? Live Vape Free- A quit vaping program for [place] teens aged 71-17. To get started, text VAPEFREENC to 3025112993. Nicotine Replacement Therapy: Available for purchase at any [facility]. Primary Care Providers: Find a [place] primary care provider here. For [facility] Employees and Spouses If you are on a [facility] medical plan, consider Tobacco Cessation Coaching through [employer]. [employer] offers 1:1 telephonic coaching support. Call [phone] or go to Active Health Managementto enroll today. Nicotine Replacement Therapy (patches, gum) is available to those who participate.

## 2024-02-02 NOTE — Progress Notes (Signed)
  Cancer Center CONSULT NOTE  Patient Care Team: Pardue, Asencion Blacksmith, DO as PCP - General (Family Medicine) Drake Gens, RN as Oncology Nurse Navigator Glenis Langdon, MD as Consulting Physician (Radiation Oncology)  Reason for visit-stage IIIa lung adenocarcinoma  CANCER STAGING   Cancer Staging  Lung cancer [facility]) Staging form: Lung, AJCC V9 - Clinical: Stage IIIA (cT4, cN1, cM0) - Signed by Averey Koning, MD on 11/24/2023   ASSESSMENT & PLAN:  Nicholas Mccoy 58 y.o. male with pmh of alcohol use, hepatitis C, hyperlipidemia, hypertension referred to medical oncology for right lung mass.  # Right lung adenocarcinoma, stage IIIA  -PET scan showed 7.3 x 4.9 cm hypermetabolic large right lung mass SUV 23.1.  Prominent right hilar mass or lymph node with SUV 12.1 measures 3.4 x 2.9 cm.  No metastatic disease outside of the chest.  -Status post EBUS with biopsy showed lung adenocarcinoma from right lung upper lobe mass FNA and brushing.  Lymph node station 7, 11R superior and inferior negative for malignancy.  -Tempus testing was reviewed with no targetable mutations.  PD-L1 5%.  -MRI brain negative for metastatic disease.  -Continue with concurrent chemo RT.  Labs reviewed and acceptable for treatment.  Will proceed with cycle 5 of weekly carboplatin  AUC 2 and Taxol  45 mg/m.  He will come in directly for cycle 6.  I will follow-up with cycle 7.  After completion of concurrent chemo RT, we discussed about starting maintenance Durvalumab  every month for 1 year.  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 - He has Protonix  but has not required it.  # Active smoker - Chantix  costing him $500.  He has previously tried patches which did not work. - Team will provide him with the number to call for smoking cessation program.  # Hepatitis C positive -Follows with Dr. Antony Baumgartner.  He is not on active treatment.   Orders  Placed This Encounter  Procedures   CBC with Differential (Cancer Center Only)    Standing Status:   Future    Expected Date:   02/16/2024    Expiration Date:   02/15/2025   CMP (Cancer Center only)    Standing Status:   Future    Expected Date:   02/16/2024    Expiration Date:   02/15/2025   RTC as scheduled.  The total time spent in the appointment was 30 minutes encounter with patients including review of chart and various tests results, discussions about plan of care and coordination of care plan   All questions were answered. The patient knows to call the clinic with any problems, questions or concerns. No barriers to learning was detected.  Loreatha Rodney, MD 4/24/202511:13 AM   HISTORY OF PRESENTING ILLNESS:  Nicholas Mccoy 58 y.o. male with pmh of alcohol use, hepatitis C, hyperlipidemia, hypertension referred to medical oncology for right lung mass.  Patient presented to [facility] ED on 10/25/2023 with complaints of chest pain.  CTA chest showed large enhancing mass in the right lung abutting the pleura appears predominantly located in the posterior aspect of right middle lobe measures 7 x 6.4 x 7 cm.  Right inferior hilar nodule measures 2.7 cm suspicious for nodal involvement.  Encases and severely narrows the right middle lobe bronchus with mass effect and displacement of right superior pulmonary vein.  Additional borderline enlarged right superior hilar lymph node 1 cm.  Patient is an active smoker for several years.  Smokes half  to 1 pack a day.  Interval history Patient was seen today as follow-up on concurrent chemo RT for stage III lung cancer. Doing well overall.  Reports mild acid reflux which has improved with lifestyle modifications.  He has not been eating late night snacks before going to bed.  I have reviewed his chart and materials related to his cancer extensively and collaborated history with the patient. Summary of oncologic history is as follows: Oncology History   Lung cancer (HCC)  11/09/2023 PET scan   FINDINGS: Mediastinal blood pool activity: SUV max 1.6   Liver activity: SUV max NA   NECK: No significant abnormal hypermetabolic activity in this region.   Incidental CT findings: Mild chronic left maxillary sinusitis.   CHEST: Large right lung mass primarily centered in the right middle lobe although based on recent CT this is likely to be crossing the minor fissure into the right upper lobe and potentially crossing the major fissure into the right lower lobe, maximum SUV 23.1, the associated hypermetabolic activity measures about 7.3 by 4.9 cm. The mass abuts the right pleural margin laterally without pleural effusion or separate pleural hypermetabolic activity.   There is also a prominent right hilar mass or lymph node with maximum SUV 12.1 measuring about 3.4 by 2.9 cm.   Postobstructive atelectasis in the right middle lobe.   Incidental CT findings: Scattered paraseptal bulla at the lung apices.   ABDOMEN/PELVIS: No significant abnormal hypermetabolic activity in this region.   Incidental CT findings: Small benign calcified lesion of the left posteroinferior spleen. Atherosclerosis is present, including aortoiliac atherosclerotic disease.   SKELETON: No significant abnormal hypermetabolic activity in this region.   Incidental CT findings: None.   IMPRESSION: 1. Large right lung mass primarily centered in the right middle lobe although based on recent CT this is likely crossing the minor fissure into the right upper lobe and potentially crossing the major fissure into the right lower lobe. 2. Prominent right hilar mass or lymph node with maximum SUV 12.1 measuring about 3.4 by 2.9 cm. 3. No findings of distant metastatic disease. 4. Mild chronic left maxillary sinusitis. 5. Scattered paraseptal bulla at the lung apices. 6. Aortic atherosclerosis.   11/17/2023 Procedure   S/p EBUS with biopsy with Dr. Lucina Sabal.   FINAL  MICROSCOPIC DIAGNOSIS:  A.  RIGHT LUNG, UPPER LOBE, MASS, FINE NEEDLE ASPIRATION  BIOPSY:  - Malignant  - Adenocarcinoma   B.  RIGHT LUNG, UPPER LOBE, MASS, BRUSHING:  - Malignant  - Adenocarcinoma   Specimen Submitted:  C. LUNG, RUL, LAVAGE:    FINAL MICROSCOPIC DIAGNOSIS:  - Atypical cells present  - Rare atypical large cells suspicious for tumor   FINAL MICROSCOPIC DIAGNOSIS:  D. LYMPH NODE, STATION 7, FINE NEEDLE ASPIRATION:  - Negative for malignancy  - Compatible with benign lymph node   E. LYMPH NODE, STATION 11R SUPERIOR, FINE NEEDLE ASPIRATION:  - Negative for malignancy  - Compatible with a benign lymph node   F. LYMPH NODE, STATION 11R INFERIOR, FINE NEEDLE ASPIRATION:  - Negative for malignancy  - Compatible with a benign lymph node      11/24/2023 Cancer Staging   Staging form: Lung, AJCC V9 - Clinical: Stage IIIA (cT4, cN1, cM0) - Signed by Rachel Rison, MD on 11/24/2023   12/08/2023 - 12/08/2023 Chemotherapy   Patient is on Treatment Plan : LUNG Carboplatin  + Paclitaxel  + XRT q7d     12/15/2023 - 12/15/2023 Chemotherapy   Patient is on Treatment Plan :  LUNG Carboplatin  (5) + Pemetrexed (500) + Pembrolizumab (200) D1 q21d Induction x 4 cycles / Maintenance Pemetrexed (500) + Pembrolizumab (200) D1 q21d     01/05/2024 -  Chemotherapy   Patient is on Treatment Plan : LUNG Carboplatin  + Paclitaxel  + XRT q7d       MEDICAL HISTORY:  Past Medical History:  Diagnosis Date   Alcohol abuse    Alcohol abuse, daily use 05/04/2023   Hepatitis C    Hyperlipidemia    Hypertension    Pneumonia    x several    SURGICAL HISTORY: Past Surgical History:  Procedure Laterality Date   BRONCHIAL BIOPSY  11/17/2023   Procedure: BRONCHIAL BIOPSIES;  Surgeon: Annitta Kindler, MD;  Location: [facility] ENDOSCOPY;  Service: Pulmonary;;   BRONCHIAL BRUSHINGS  11/17/2023   Procedure: BRONCHIAL BRUSHINGS;  Surgeon: Annitta Kindler, MD;  Location: [facility] ENDOSCOPY;  Service:  Pulmonary;;   BRONCHIAL NEEDLE ASPIRATION BIOPSY  11/17/2023   Procedure: BRONCHIAL NEEDLE ASPIRATION BIOPSIES;  Surgeon: Annitta Kindler, MD;  Location: [facility] ENDOSCOPY;  Service: Pulmonary;;   BRONCHIAL WASHINGS  11/17/2023   Procedure: BRONCHIAL WASHINGS;  Surgeon: Annitta Kindler, MD;  Location: [facility] ENDOSCOPY;  Service: Pulmonary;;   COLONOSCOPY WITH PROPOFOL  N/A 06/28/2023   Procedure: COLONOSCOPY WITH PROPOFOL ;  Surgeon: Luke Salaam, MD;  Location: [facility] ENDOSCOPY;  Service: Gastroenterology;  Laterality: N/A;   ENDOBRONCHIAL ULTRASOUND Bilateral 11/17/2023   Procedure: ENDOBRONCHIAL ULTRASOUND;  Surgeon: Annitta Kindler, MD;  Location: [facility] ENDOSCOPY;  Service: Pulmonary;  Laterality: Bilateral;   FINE NEEDLE ASPIRATION  11/17/2023   Procedure: FINE NEEDLE ASPIRATION (FNA) LINEAR;  Surgeon: Annitta Kindler, MD;  Location: [facility] ENDOSCOPY;  Service: Pulmonary;;   HEMOSTASIS CONTROL  11/17/2023   Procedure: HEMOSTASIS CONTROL;  Surgeon: Annitta Kindler, MD;  Location: [facility] ENDOSCOPY;  Service: Pulmonary;;   IR IMAGING GUIDED PORT INSERTION  12/13/2023   POLYPECTOMY  06/28/2023   Procedure: POLYPECTOMY;  Surgeon: Luke Salaam, MD;  Location: [facility] ENDOSCOPY;  Service: Gastroenterology;;   VIDEO BRONCHOSCOPY WITH RADIAL ENDOBRONCHIAL ULTRASOUND  11/17/2023   Procedure: VIDEO BRONCHOSCOPY WITH RADIAL ENDOBRONCHIAL ULTRASOUND;  Surgeon: Annitta Kindler, MD;  Location: [facility] ENDOSCOPY;  Service: Pulmonary;;    SOCIAL HISTORY: Social History   Socioeconomic History   Marital status: Divorced    Spouse name: Not on file   Number of children: Not on file   Years of education: Not on file   Highest education level: 10th grade  Occupational History   Not on file  Tobacco Use   Smoking status: Every Day    Current packs/day: 0.50    Average packs/day: 0.5 packs/day for 25.0 years (12.5 ttl pk-yrs)    Types: Cigarettes   Smokeless tobacco: Never   Tobacco comments:    Started smoking at  58 years old.    Smoked 1 PPD at his heaviest.    Now smokes 3 cigarettes. Khj 11/09/2023  Vaping Use   Vaping status: Never Used  Substance and Sexual Activity   Alcohol use: Not Currently    Comment: stopped drinking 11/2017   Drug use: No   Sexual activity: Yes  Other Topics Concern   Not on file  Social History Narrative   Not on file   Social Drivers of Health   Financial Resource Strain: Low Risk  (07/25/2023)   Overall Financial Resource Strain (CARDIA)    Difficulty of Paying Living Expenses: Not very hard  Food Insecurity: No Food Insecurity (11/03/2023)   Hunger Vital Sign    Worried About Running  Out of Food in the Last Year: Never true    Ran Out of Food in the Last Year: Never true  Transportation Needs: No Transportation Needs (07/25/2023)   PRAPARE - Administrator, Civil Service (Medical): No    Lack of Transportation (Non-Medical): No  Physical Activity: Insufficiently Active (07/25/2023)   Exercise Vital Sign    Days of Exercise per Week: 5 days    Minutes of Exercise per Session: 10 min  Stress: No Stress Concern Present (07/25/2023)   [employer] of Occupational Health - Occupational Stress Questionnaire    Feeling of Stress : Not at all  Social Connections: Moderately Isolated (07/25/2023)   Social Connection and Isolation Panel [NHANES]    Frequency of Communication with Friends and Family: Three times a week    Frequency of Social Gatherings with Friends and Family: Once a week    Attends Religious Services: More than 4 times per year    Active Member of [employer] or Organizations: No    Attends Engineer, structural: Not on file    Marital Status: Divorced  Intimate Partner Violence: Not At Risk (11/03/2023)   Humiliation, Afraid, Rape, and Kick questionnaire    Fear of Current or Ex-Partner: No    Emotionally Abused: No    Physically Abused: No    Sexually Abused: No    FAMILY HISTORY: Family History  Problem Relation Age  of Onset   Hypertension Father    Heart disease Father    Heart attack Father    Heart attack Sister    Hypertension Brother    Healthy Brother    Sickle cell anemia Sister    Hypertension Mother    Hypertension Maternal Grandmother    Hypertension Maternal Grandfather    Hypertension Paternal Grandmother    Hypertension Paternal Grandfather     ALLERGIES:  has no known allergies.  MEDICATIONS:  Current Outpatient Medications  Medication Sig Dispense Refill   amLODipine  (NORVASC ) 5 MG tablet TAKE 1 TABLET (5 MG TOTAL) BY MOUTH DAILY. 30 tablet 0   dexamethasone  (DECADRON ) 4 MG tablet Take 2 tablets daily for 2 days, start the day after chemotherapy. Take with food. 30 tablet 1   lidocaine -prilocaine  (EMLA ) cream Apply to affected area once 30 g 3   ondansetron  (ZOFRAN ) 8 MG tablet Take 1 tablet (8 mg total) by mouth every 8 (eight) hours as needed for nausea or vomiting. Start on the third day after chemotherapy. 30 tablet 1   pantoprazole  (PROTONIX ) 40 MG tablet Take 1 tablet (40 mg total) by mouth daily. 30 tablet 0   prochlorperazine  (COMPAZINE ) 10 MG tablet Take 1 tablet (10 mg total) by mouth every 6 (six) hours as needed for nausea or vomiting. 30 tablet 1   rosuvastatin  (CRESTOR ) 5 MG tablet TAKE 1 TABLET (5 MG TOTAL) BY MOUTH DAILY. (Patient taking differently: Take 5 mg by mouth at bedtime.) 90 tablet 1   varenicline  (CHANTIX ) 0.5 MG tablet Days 1 to 3- 0.5 mg once daily, Days 4 to 7-0.5 mg twice daily, Maintenance day 8 and later 1 mg twice daily. If side effects, reduce dose to previously tolerated dose. 60 tablet 0   No current facility-administered medications for this visit.    REVIEW OF SYSTEMS:   Pertinent information mentioned in HPI All other systems were reviewed with the patient and are negative.  PHYSICAL EXAMINATION: ECOG PERFORMANCE STATUS: 1 - Symptomatic but completely ambulatory  Vitals:   02/02/24 1046  BP:  118/78  Resp: 18  Temp: 99.1 F (37.3  C)  SpO2: 97%    Filed Weights   02/02/24 1046  Weight: 135 lb (61.2 kg)     GENERAL:alert, no distress and comfortable SKIN: skin color, texture, turgor are normal, no rashes or significant lesions EYES: normal, conjunctiva are pink and non-injected, sclera clear OROPHARYNX:no exudate, no erythema and lips, buccal mucosa, and tongue normal  NECK: supple, thyroid normal size, non-tender, without nodularity LYMPH:  no palpable lymphadenopathy in the cervical, axillary or inguinal LUNGS: clear to auscultation and percussion with normal breathing effort HEART: regular rate & rhythm and no murmurs and no lower extremity edema ABDOMEN:abdomen soft, non-tender and normal bowel sounds Musculoskeletal:no cyanosis of digits and no clubbing  PSYCH: alert & oriented x 3 with fluent speech NEURO: no focal motor/sensory deficits  LABORATORY DATA:  I have reviewed the data as listed Lab Results  Component Value Date   WBC 4.0 02/02/2024   HGB 11.3 (L) 02/02/2024   HCT 35.6 (L) 02/02/2024   MCV 82.6 02/02/2024   PLT 144 (L) 02/02/2024   Recent Labs    01/19/24 1325 01/26/24 1027 02/02/24 1033  NA 132* 136 134*  K 4.1 4.0 4.0  CL 100 106 103  CO2 24 24 24   GLUCOSE 84 130* 87  BUN 14 12 10   CREATININE 0.55* 0.68 0.63  CALCIUM  9.2 8.9 8.8*  GFRNONAA >60 >60 >60  PROT 7.0 6.3* 6.5  ALBUMIN 3.1* 2.9* 3.0*  AST 84* 58* 57*  ALT 100* 66* 73*  ALKPHOS 68 67 67  BILITOT 0.9 0.7 0.6    RADIOGRAPHIC STUDIES: I have personally reviewed the radiological images as listed and agreed with the findings in the report. No results found.

## 2024-02-03 ENCOUNTER — Ambulatory Visit
Admission: RE | Admit: 2024-02-03 | Discharge: 2024-02-03 | Disposition: A | Discharge status: Home / Self Care | Source: Ambulatory Visit | Attending: Radiation Oncology | Admitting: Radiation Oncology

## 2024-02-03 ENCOUNTER — Other Ambulatory Visit: Payer: Self-pay

## 2024-02-03 ENCOUNTER — Telehealth: Payer: Self-pay | Admitting: *Deleted

## 2024-02-03 DIAGNOSIS — C342 Malignant neoplasm of middle lobe, bronchus or lung: Secondary | ICD-10-CM | POA: Diagnosis not present

## 2024-02-03 LAB — RAD ONC ARIA SESSION SUMMARY
Course Elapsed Days: 32
Plan Fractions Treated to Date: 25
Plan Prescribed Dose Per Fraction: 2 Gy
Plan Total Fractions Prescribed: 35
Plan Total Prescribed Dose: 70 Gy
Reference Point Dosage Given to Date: 50 Gy
Reference Point Session Dosage Given: 2 Gy
Session Number: 25

## 2024-02-03 MED ORDER — GABAPENTIN 100 MG PO CAPS: 200.0000 mg | ORAL_CAPSULE | Freq: Three times a day (TID) | ORAL | 0 refills | Status: DC

## 2024-02-03 NOTE — Telephone Encounter (Signed)
 Met with patient before radiation treatment and pt stated that he has been having frequent hiccups. Per Dr. Alana Hoyle, pt needs to continue taking protonix  and she will send in prescription for gabapentin. Pt made aware of MD recommendations. Pt states that protonix  makes him drowsy so Dr. Alana Hoyle advised him to take it at night along with gabapentin. Pt made aware that gabapentin can make him drowsy as well initially. Pt verbalized understanding. Nothing further needed at this time.

## 2024-02-03 NOTE — Addendum Note (Signed)
 Addended by: Kaija Kovacevic on: 02/03/2024 10:31 AM   Modules accepted: Orders

## 2024-02-06 ENCOUNTER — Other Ambulatory Visit: Payer: Self-pay

## 2024-02-06 ENCOUNTER — Ambulatory Visit
Admission: RE | Admit: 2024-02-06 | Discharge: 2024-02-06 | Disposition: A | Discharge status: Home / Self Care | Source: Ambulatory Visit | Attending: Radiation Oncology | Admitting: Radiation Oncology

## 2024-02-06 DIAGNOSIS — C342 Malignant neoplasm of middle lobe, bronchus or lung: Secondary | ICD-10-CM | POA: Diagnosis not present

## 2024-02-06 LAB — RAD ONC ARIA SESSION SUMMARY
Course Elapsed Days: 35
Plan Fractions Treated to Date: 26
Plan Prescribed Dose Per Fraction: 2 Gy
Plan Total Fractions Prescribed: 35
Plan Total Prescribed Dose: 70 Gy
Reference Point Dosage Given to Date: 52 Gy
Reference Point Session Dosage Given: 2 Gy
Session Number: 26

## 2024-02-07 ENCOUNTER — Ambulatory Visit
Admission: RE | Admit: 2024-02-07 | Discharge: 2024-02-07 | Disposition: A | Discharge status: Home / Self Care | Source: Ambulatory Visit | Attending: Radiation Oncology | Admitting: Radiation Oncology

## 2024-02-07 ENCOUNTER — Encounter: Payer: Self-pay | Admitting: Internal Medicine

## 2024-02-07 ENCOUNTER — Other Ambulatory Visit: Payer: Self-pay

## 2024-02-07 DIAGNOSIS — C342 Malignant neoplasm of middle lobe, bronchus or lung: Secondary | ICD-10-CM | POA: Diagnosis not present

## 2024-02-07 LAB — RAD ONC ARIA SESSION SUMMARY
Course Elapsed Days: 36
Plan Fractions Treated to Date: 27
Plan Prescribed Dose Per Fraction: 2 Gy
Plan Total Fractions Prescribed: 35
Plan Total Prescribed Dose: 70 Gy
Reference Point Dosage Given to Date: 54 Gy
Reference Point Session Dosage Given: 2 Gy
Session Number: 27

## 2024-02-08 ENCOUNTER — Other Ambulatory Visit: Payer: Self-pay

## 2024-02-08 ENCOUNTER — Ambulatory Visit
Admission: RE | Admit: 2024-02-08 | Discharge: 2024-02-08 | Disposition: A | Discharge status: Home / Self Care | Source: Ambulatory Visit | Attending: Radiation Oncology | Admitting: Radiation Oncology

## 2024-02-08 DIAGNOSIS — C342 Malignant neoplasm of middle lobe, bronchus or lung: Secondary | ICD-10-CM | POA: Diagnosis not present

## 2024-02-08 LAB — RAD ONC ARIA SESSION SUMMARY
Course Elapsed Days: 37
Plan Fractions Treated to Date: 28
Plan Prescribed Dose Per Fraction: 2 Gy
Plan Total Fractions Prescribed: 35
Plan Total Prescribed Dose: 70 Gy
Reference Point Dosage Given to Date: 56 Gy
Reference Point Session Dosage Given: 2 Gy
Session Number: 28

## 2024-02-09 ENCOUNTER — Ambulatory Visit
Admission: RE | Admit: 2024-02-09 | Discharge: 2024-02-09 | Disposition: A | Discharge status: Home / Self Care | Source: Ambulatory Visit | Attending: Radiation Oncology | Admitting: Radiation Oncology

## 2024-02-09 ENCOUNTER — Other Ambulatory Visit: Payer: Self-pay

## 2024-02-09 ENCOUNTER — Telehealth: Payer: Self-pay | Admitting: *Deleted

## 2024-02-09 ENCOUNTER — Inpatient Hospital Stay

## 2024-02-09 ENCOUNTER — Ambulatory Visit: Admitting: Internal Medicine

## 2024-02-09 ENCOUNTER — Ambulatory Visit

## 2024-02-09 VITALS — BP 110/74 | HR 96 | Resp 18

## 2024-02-09 DIAGNOSIS — Z79630 Long term (current) use of alkylating agent: Secondary | ICD-10-CM | POA: Insufficient documentation

## 2024-02-09 DIAGNOSIS — F17210 Nicotine dependence, cigarettes, uncomplicated: Secondary | ICD-10-CM | POA: Insufficient documentation

## 2024-02-09 DIAGNOSIS — I700 Atherosclerosis of aorta: Secondary | ICD-10-CM | POA: Insufficient documentation

## 2024-02-09 DIAGNOSIS — J320 Chronic maxillary sinusitis: Secondary | ICD-10-CM | POA: Insufficient documentation

## 2024-02-09 DIAGNOSIS — K219 Gastro-esophageal reflux disease without esophagitis: Secondary | ICD-10-CM | POA: Insufficient documentation

## 2024-02-09 DIAGNOSIS — I10 Essential (primary) hypertension: Secondary | ICD-10-CM | POA: Insufficient documentation

## 2024-02-09 DIAGNOSIS — C3411 Malignant neoplasm of upper lobe, right bronchus or lung: Secondary | ICD-10-CM | POA: Insufficient documentation

## 2024-02-09 DIAGNOSIS — C3491 Malignant neoplasm of unspecified part of right bronchus or lung: Secondary | ICD-10-CM

## 2024-02-09 DIAGNOSIS — Z5111 Encounter for antineoplastic chemotherapy: Secondary | ICD-10-CM | POA: Insufficient documentation

## 2024-02-09 DIAGNOSIS — Z832 Family history of diseases of the blood and blood-forming organs and certain disorders involving the immune mechanism: Secondary | ICD-10-CM | POA: Insufficient documentation

## 2024-02-09 DIAGNOSIS — Z79633 Long term (current) use of mitotic inhibitor: Secondary | ICD-10-CM | POA: Insufficient documentation

## 2024-02-09 DIAGNOSIS — D649 Anemia, unspecified: Secondary | ICD-10-CM | POA: Insufficient documentation

## 2024-02-09 DIAGNOSIS — Z7952 Long term (current) use of systemic steroids: Secondary | ICD-10-CM | POA: Insufficient documentation

## 2024-02-09 DIAGNOSIS — C342 Malignant neoplasm of middle lobe, bronchus or lung: Secondary | ICD-10-CM | POA: Insufficient documentation

## 2024-02-09 DIAGNOSIS — E785 Hyperlipidemia, unspecified: Secondary | ICD-10-CM | POA: Insufficient documentation

## 2024-02-09 DIAGNOSIS — E46 Unspecified protein-calorie malnutrition: Secondary | ICD-10-CM

## 2024-02-09 DIAGNOSIS — B1920 Unspecified viral hepatitis C without hepatic coma: Secondary | ICD-10-CM | POA: Insufficient documentation

## 2024-02-09 DIAGNOSIS — Z510 Encounter for antineoplastic radiation therapy: Secondary | ICD-10-CM | POA: Diagnosis not present

## 2024-02-09 DIAGNOSIS — Z8249 Family history of ischemic heart disease and other diseases of the circulatory system: Secondary | ICD-10-CM | POA: Insufficient documentation

## 2024-02-09 DIAGNOSIS — Z5112 Encounter for antineoplastic immunotherapy: Secondary | ICD-10-CM | POA: Insufficient documentation

## 2024-02-09 DIAGNOSIS — Z79620 Long term (current) use of immunosuppressive biologic: Secondary | ICD-10-CM | POA: Insufficient documentation

## 2024-02-09 DIAGNOSIS — Z79899 Other long term (current) drug therapy: Secondary | ICD-10-CM | POA: Insufficient documentation

## 2024-02-09 LAB — CMP (CANCER CENTER ONLY)
ALT: 91 U/L — ABNORMAL HIGH (ref 0–44)
AST: 87 U/L — ABNORMAL HIGH (ref 15–41)
Albumin: 3.1 g/dL — ABNORMAL LOW (ref 3.5–5.0)
Alkaline Phosphatase: 70 U/L (ref 38–126)
Anion gap: 7 (ref 5–15)
BUN: 11 mg/dL (ref 6–20)
CO2: 25 mmol/L (ref 22–32)
Calcium: 8.8 mg/dL — ABNORMAL LOW (ref 8.9–10.3)
Chloride: 102 mmol/L (ref 98–111)
Creatinine: 0.64 mg/dL (ref 0.61–1.24)
GFR, Estimated: >60 mL/min (ref >60)
Glucose, Bld: 95 mg/dL (ref 70–99)
Potassium: 4.3 mmol/L (ref 3.5–5.1)
Sodium: 134 mmol/L — ABNORMAL LOW (ref 135–145)
Total Bilirubin: 0.7 mg/dL (ref 0.0–1.2)
Total Protein: 6 g/dL — ABNORMAL LOW (ref 6.5–8.1)

## 2024-02-09 LAB — CBC WITH DIFFERENTIAL (CANCER CENTER ONLY)
Abs Immature Granulocytes: 0.07 10*3/uL (ref 0.00–0.07)
Basophils Absolute: 0 10*3/uL (ref 0.0–0.1)
Basophils Relative: 0 %
Eosinophils Absolute: 0 10*3/uL (ref 0.0–0.5)
Eosinophils Relative: 0 %
HCT: 33.3 % — ABNORMAL LOW (ref 39.0–52.0)
Hemoglobin: 10.8 g/dL — ABNORMAL LOW (ref 13.0–17.0)
Immature Granulocytes: 3 %
Lymphocytes Relative: 8 %
Lymphs Abs: 0.2 10*3/uL — ABNORMAL LOW (ref 0.7–4.0)
MCH: 26.8 pg (ref 26.0–34.0)
MCHC: 32.4 g/dL (ref 30.0–36.0)
MCV: 82.6 fL (ref 80.0–100.0)
Monocytes Absolute: 0.2 10*3/uL (ref 0.1–1.0)
Monocytes Relative: 10 %
Neutro Abs: 1.8 10*3/uL (ref 1.7–7.7)
Neutrophils Relative %: 79 %
Platelet Count: 116 10*3/uL — ABNORMAL LOW (ref 150–400)
RBC: 4.03 MIL/uL — ABNORMAL LOW (ref 4.22–5.81)
RDW: 20.5 % — ABNORMAL HIGH (ref 11.5–15.5)
WBC Count: 2.3 10*3/uL — ABNORMAL LOW (ref 4.0–10.5)
nRBC: 1.3 % — ABNORMAL HIGH (ref 0.0–0.2)

## 2024-02-09 LAB — RAD ONC ARIA SESSION SUMMARY
Course Elapsed Days: 38
Plan Fractions Treated to Date: 29
Plan Prescribed Dose Per Fraction: 2 Gy
Plan Total Fractions Prescribed: 35
Plan Total Prescribed Dose: 70 Gy
Reference Point Dosage Given to Date: 58 Gy
Reference Point Session Dosage Given: 2 Gy
Session Number: 29

## 2024-02-09 MED ORDER — DEXAMETHASONE SODIUM PHOSPHATE 10 MG/ML IJ SOLN
10.0000 mg | Freq: Once | INTRAMUSCULAR | Status: AC
  Administered 2024-02-09: 10 mg via INTRAVENOUS
  Filled 2024-02-09: qty 1

## 2024-02-09 MED ORDER — SODIUM CHLORIDE 0.9 % IV SOLN
45.0000 mg/m2 | Freq: Once | INTRAVENOUS | Status: AC
  Administered 2024-02-09: 78 mg via INTRAVENOUS
  Filled 2024-02-09: qty 13

## 2024-02-09 MED ORDER — HEPARIN SOD (PORK) LOCK FLUSH 100 UNIT/ML IV SOLN
500.0000 [IU] | Freq: Once | INTRAVENOUS | Status: AC | PRN
  Administered 2024-02-09: 500 [IU]
  Filled 2024-02-09: qty 5

## 2024-02-09 MED ORDER — PALONOSETRON HCL INJECTION 0.25 MG/5ML
0.2500 mg | Freq: Once | INTRAVENOUS | Status: AC
  Administered 2024-02-09: 0.25 mg via INTRAVENOUS

## 2024-02-09 MED ORDER — SODIUM CHLORIDE 0.9 % IV SOLN
INTRAVENOUS | Status: DC
Start: 2024-02-09 — End: 2024-02-09
  Filled 2024-02-09: qty 250

## 2024-02-09 MED ORDER — DIPHENHYDRAMINE HCL 50 MG/ML IJ SOLN
50.0000 mg | Freq: Once | INTRAMUSCULAR | Status: AC
  Administered 2024-02-09: 50 mg via INTRAVENOUS
  Filled 2024-02-09: qty 1

## 2024-02-09 MED ORDER — BOOST PLUS PO LIQD: 237.0000 mL | Freq: Three times a day (TID) | ORAL | 0 refills | Status: DC

## 2024-02-09 MED ORDER — SODIUM CHLORIDE 0.9 % IV SOLN
223.8000 mg | Freq: Once | INTRAVENOUS | Status: AC
  Administered 2024-02-09: 220 mg via INTRAVENOUS
  Filled 2024-02-09: qty 22

## 2024-02-09 MED ORDER — FAMOTIDINE IN NACL 20-0.9 MG/50ML-% IV SOLN
20.0000 mg | Freq: Once | INTRAVENOUS | Status: AC
Start: 2024-02-09 — End: 2024-02-09
  Administered 2024-02-09: 20 mg via INTRAVENOUS

## 2024-02-09 NOTE — Telephone Encounter (Signed)
-----   Message from Lander Pines sent at 02/09/2024  2:25 PM EDT ----- Dr Aris Bel,  [place] Medicaid is now covering adult oral nutrition supplements (ie boost/ensure) for patients.  [place] Medicaid patients are eligible for coverage after submitting a prior authorization.    In order to submit the authorization, fax the following to [facility] [phone])  Signed Rx from provider (Recommend boost plus TID)  Last chart note from MD  Dietitian note   Demographic sheet  Approval could take up to 2 weeks.  Once approved, [employer] will contact patient directly and ship out one month's supply of oral nutrition supplements.  Patient is responsible for contacting [employer] and reordering each month.    Please let me know if you have questions.  Thanks, Joli

## 2024-02-09 NOTE — Patient Instructions (Signed)
 [facility]  Discharge Instructions: Thank you for choosing [facility] to provide your oncology and hematology care.  If you have a lab appointment with the Cancer Center, please go directly to the Cancer Center and check in at the registration area.  Wear comfortable clothing and clothing appropriate for easy access to any Portacath or PICC line.   We strive to give you quality time with your provider. You may need to reschedule your appointment if you arrive late (15 or more minutes).  Arriving late affects you and other patients whose appointments are after yours.  Also, if you miss three or more appointments without notifying the office, you may be dismissed from the clinic at the provider's discretion.      For prescription refill requests, have your pharmacy contact our office and allow 72 hours for refills to be completed.    Today you received the following chemotherapy and/or immunotherapy agents TAXOL and CARBOPLATIN       To help prevent nausea and vomiting after your treatment, we encourage you to take your nausea medication as directed.  BELOW ARE SYMPTOMS THAT SHOULD BE REPORTED IMMEDIATELY: *FEVER GREATER THAN 100.4 F (38 C) OR HIGHER *CHILLS OR SWEATING *NAUSEA AND VOMITING THAT IS NOT CONTROLLED WITH YOUR NAUSEA MEDICATION *UNUSUAL SHORTNESS OF BREATH *UNUSUAL BRUISING OR BLEEDING *URINARY PROBLEMS (pain or burning when urinating, or frequent urination) *BOWEL PROBLEMS (unusual diarrhea, constipation, pain near the anus) TENDERNESS IN MOUTH AND THROAT WITH OR WITHOUT PRESENCE OF ULCERS (sore throat, sores in mouth, or a toothache) UNUSUAL RASH, SWELLING OR PAIN  UNUSUAL VAGINAL DISCHARGE OR ITCHING   Items with * indicate a potential emergency and should be followed up as soon as possible or go to the Emergency Department if any problems should occur.  Please show the CHEMOTHERAPY ALERT CARD or  IMMUNOTHERAPY ALERT CARD at check-in to the Emergency Department and triage nurse.  Should you have questions after your visit or need to cancel or reschedule your appointment, please contact [facility] - A DEPT OF [facility]  [phone] and follow the prompts.  Office hours are 8:00 a.m. to 4:30 p.m. Monday - Friday. Please note that voicemails left after 4:00 p.m. may not be returned until the following business day.  We are closed weekends and major holidays. You have access to a nurse at all times for urgent questions. Please call the main number to the clinic [phone] and follow the prompts.  For any non-urgent questions, you may also contact your provider using MyChart. We now offer e-Visits for anyone 83 and older to request care online for non-urgent symptoms. For details visit [email or web address].   Also download the MyChart app! Go to the app store, search "MyChart", open the app, select [place], and log in with your MyChart username and password.  Paclitaxel Injection What is this medication? PACLITAXEL (PAK li TAX el) treats some types of cancer. It works by slowing down the growth of cancer cells. This medicine may be used for other purposes; ask your health care provider or pharmacist if you have questions. COMMON BRAND NAME(S): Onxol, Taxol What should I tell my care team before I take this medication? They need to know if you have any of these conditions: Heart disease Liver disease Low white blood cell levels An unusual or allergic reaction to paclitaxel, other medications, foods, dyes, or preservatives  If you or your partner are pregnant or trying to get pregnant Breast-feeding How should I use this medication? This medication is injected into a vein. It is given by your care team in a hospital or clinic setting. Talk to your care team about the use of this medication in children. While it may be given to children for selected  conditions, precautions do apply. Overdosage: If you think you have taken too much of this medicine contact a poison control center or emergency room at once. NOTE: This medicine is only for you. Do not share this medicine with others. What if I miss a dose? Keep appointments for follow-up doses. It is important not to miss your dose. Call your care team if you are unable to keep an appointment. What may interact with this medication? Do not take this medication with any of the following: Live virus vaccines Other medications may affect the way this medication works. Talk with your care team about all of the medications you take. They may suggest changes to your treatment plan to lower the risk of side effects and to make sure your medications work as intended. This list may not describe all possible interactions. Give your health care provider a list of all the medicines, herbs, non-prescription drugs, or dietary supplements you use. Also tell them if you smoke, drink alcohol, or use illegal drugs. Some items may interact with your medicine. What should I watch for while using this medication? Your condition will be monitored carefully while you are receiving this medication. You may need blood work while taking this medication. This medication may make you feel generally unwell. This is not uncommon as chemotherapy can affect healthy cells as well as cancer cells. Report any side effects. Continue your course of treatment even though you feel ill unless your care team tells you to stop. This medication can cause serious allergic reactions. To reduce the risk, your care team may give you other medications to take before receiving this one. Be sure to follow the directions from your care team. This medication may increase your risk of getting an infection. Call your care team for advice if you get a fever, chills, sore throat, or other symptoms of a cold or flu. Do not treat yourself. Try to avoid  being around people who are sick. This medication may increase your risk to bruise or bleed. Call your care team if you notice any unusual bleeding. Be careful brushing or flossing your teeth or using a toothpick because you may get an infection or bleed more easily. If you have any dental work done, tell your dentist you are receiving this medication. Talk to your care team if you may be pregnant. Serious birth defects can occur if you take this medication during pregnancy. Talk to your care team before breastfeeding. Changes to your treatment plan may be needed. What side effects may I notice from receiving this medication? Side effects that you should report to your care team as soon as possible: Allergic reactions--skin rash, itching, hives, swelling of the face, lips, tongue, or throat Heart rhythm changes--fast or irregular heartbeat, dizziness, feeling faint or lightheaded, chest pain, trouble breathing Increase in blood pressure Infection--fever, chills, cough, sore throat, wounds that don't heal, pain or trouble when passing urine, general feeling of discomfort or being unwell Low blood pressure--dizziness, feeling faint or lightheaded, blurry vision Low red blood cell level--unusual weakness or fatigue, dizziness, headache, trouble breathing Painful swelling, warmth, or redness of the skin, blisters  or sores at the infusion site Pain, tingling, or numbness in the hands or feet Slow heartbeat--dizziness, feeling faint or lightheaded, confusion, trouble breathing, unusual weakness or fatigue Unusual bruising or bleeding Side effects that usually do not require medical attention (report to your care team if they continue or are bothersome): Diarrhea Hair loss Joint pain Loss of appetite Muscle pain Nausea Vomiting This list may not describe all possible side effects. Call your doctor for medical advice about side effects. You may report side effects to FDA at 1-800-FDA-1088. Where  should I keep my medication? This medication is given in a hospital or clinic. It will not be stored at home. NOTE: This sheet is a summary. It may not cover all possible information. If you have questions about this medicine, talk to your doctor, pharmacist, or health care provider.  2024 Elsevier/Gold Standard (2022-02-16 00:00:00)  Carboplatin Injection What is this medication? CARBOPLATIN (KAR boe pla tin) treats some types of cancer. It works by slowing down the growth of cancer cells. This medicine may be used for other purposes; ask your health care provider or pharmacist if you have questions. COMMON BRAND NAME(S): Paraplatin What should I tell my care team before I take this medication? They need to know if you have any of these conditions: Blood disorders Hearing problems Kidney disease Recent or ongoing radiation therapy An unusual or allergic reaction to carboplatin, cisplatin, other medications, foods, dyes, or preservatives Pregnant or trying to get pregnant Breast-feeding How should I use this medication? This medication is injected into a vein. It is given by your care team in a hospital or clinic setting. Talk to your care team about the use of this medication in children. Special care may be needed. Overdosage: If you think you have taken too much of this medicine contact a poison control center or emergency room at once. NOTE: This medicine is only for you. Do not share this medicine with others. What if I miss a dose? Keep appointments for follow-up doses. It is important not to miss your dose. Call your care team if you are unable to keep an appointment. What may interact with this medication? Medications for seizures Some antibiotics, such as amikacin, gentamicin, neomycin, streptomycin, tobramycin Vaccines This list may not describe all possible interactions. Give your health care provider a list of all the medicines, herbs, non-prescription drugs, or dietary  supplements you use. Also tell them if you smoke, drink alcohol, or use illegal drugs. Some items may interact with your medicine. What should I watch for while using this medication? Your condition will be monitored carefully while you are receiving this medication. You may need blood work while taking this medication. This medication may make you feel generally unwell. This is not uncommon, as chemotherapy can affect healthy cells as well as cancer cells. Report any side effects. Continue your course of treatment even though you feel ill unless your care team tells you to stop. In some cases, you may be given additional medications to help with side effects. Follow all directions for their use. This medication may increase your risk of getting an infection. Call your care team for advice if you get a fever, chills, sore throat, or other symptoms of a cold or flu. Do not treat yourself. Try to avoid being around people who are sick. Avoid taking medications that contain aspirin, acetaminophen, ibuprofen, naproxen, or ketoprofen unless instructed by your care team. These medications may hide a fever. Be careful brushing or flossing your  teeth or using a toothpick because you may get an infection or bleed more easily. If you have any dental work done, tell your dentist you are receiving this medication. Talk to your care team if you wish to become pregnant or think you might be pregnant. This medication can cause serious birth defects. Talk to your care team about effective forms of contraception. Do not breast-feed while taking this medication. What side effects may I notice from receiving this medication? Side effects that you should report to your care team as soon as possible: Allergic reactions--skin rash, itching, hives, swelling of the face, lips, tongue, or throat Infection--fever, chills, cough, sore throat, wounds that don't heal, pain or trouble when passing urine, general feeling of  discomfort or being unwell Low red blood cell level--unusual weakness or fatigue, dizziness, headache, trouble breathing Pain, tingling, or numbness in the hands or feet, muscle weakness, change in vision, confusion or trouble speaking, loss of balance or coordination, trouble walking, seizures Unusual bruising or bleeding Side effects that usually do not require medical attention (report to your care team if they continue or are bothersome): Hair loss Nausea Unusual weakness or fatigue Vomiting This list may not describe all possible side effects. Call your doctor for medical advice about side effects. You may report side effects to FDA at 1-800-FDA-1088. Where should I keep my medication? This medication is given in a hospital or clinic. It will not be stored at home. NOTE: This sheet is a summary. It may not cover all possible information. If you have questions about this medicine, talk to your doctor, pharmacist, or health care provider.  2024 Elsevier/Gold Standard (2022-01-19 00:00:00)

## 2024-02-09 NOTE — Progress Notes (Signed)
 Nutrition Follow-up:  Missed patient during infusion today  Called patient and spoke by phone for nutrition follow-up.  Patient reports that appetite is good.  He is eating 4 + times a day.  Drinking boost plus shakes. Family is helping try and purchase because expensive for patient to buy.  Has been eating meats, vegetables, pinto beans.    Nutrition Focused Physical Exam (performed on 12/20/2023):    Orbital Region: mild Buccal Region: moderate Upper Arm Region: moderate Thoracic and Lumbar Region: moderate Temple Region: moderate Clavicle Bone Region: moderate Shoulder and Acromion Bone Region: moderate Scapular Bone Region: moderate Dorsal Hand: WNL Patellar Region: mild Anterior Thigh Region: unable to assess Posterior Calf Region: mild Edema (RD assessment): none Hair: assessed Eyes: assessed Mouth: assessed Skin: dry Nails: long   Medications: reviewed  Labs: reviewed  Anthropometrics:   Weight 133 lb 9.6 oz on 5/1 132 lb 4.8 oz on 4/17 128 lb on 4/3 131 lb 4.8 oz on 3/4 140 lb on Jan 2025   Estimated Energy Needs  Kcals: 1480-1700 Protein: 74-85 g Fluid: 1480-1700 ml  NUTRITION DIAGNOSIS: Unintentional weight loss stable   MALNUTRITION DIAGNOSIS: Patient meets criteria for severe malnutrition in context of acute illness as evidenced by 8% weight loss in the last 2 months and moderate fat and muscle mass loss.    INTERVENTION:  Recommend boost plus shakes TID.  Prior authorization will be sent to [facility] for oral nutrition supplement coverage Continue high calorie, high protein foods to help maintain weight    MONITORING, EVALUATION, GOAL: weight trends, intake   NEXT VISIT: Thursday, May 8 during infusion  Shoshanna Mcquitty B. Zollie Hipp, CSO, LDN Registered Dietitian [phone]

## 2024-02-10 ENCOUNTER — Ambulatory Visit
Admission: RE | Admit: 2024-02-10 | Discharge: 2024-02-10 | Disposition: A | Discharge status: Home / Self Care | Source: Ambulatory Visit | Attending: Radiation Oncology | Admitting: Radiation Oncology

## 2024-02-10 ENCOUNTER — Other Ambulatory Visit: Payer: Self-pay

## 2024-02-10 DIAGNOSIS — C342 Malignant neoplasm of middle lobe, bronchus or lung: Secondary | ICD-10-CM | POA: Diagnosis not present

## 2024-02-10 DIAGNOSIS — Z5111 Encounter for antineoplastic chemotherapy: Secondary | ICD-10-CM | POA: Diagnosis not present

## 2024-02-10 LAB — RAD ONC ARIA SESSION SUMMARY
Course Elapsed Days: 39
Plan Fractions Treated to Date: 30
Plan Prescribed Dose Per Fraction: 2 Gy
Plan Total Fractions Prescribed: 35
Plan Total Prescribed Dose: 70 Gy
Reference Point Dosage Given to Date: 60 Gy
Reference Point Session Dosage Given: 2 Gy
Session Number: 30

## 2024-02-11 ENCOUNTER — Other Ambulatory Visit: Payer: Self-pay | Admitting: Internal Medicine

## 2024-02-11 ENCOUNTER — Other Ambulatory Visit: Payer: Self-pay | Admitting: Family Medicine

## 2024-02-11 DIAGNOSIS — I10 Essential (primary) hypertension: Secondary | ICD-10-CM

## 2024-02-13 ENCOUNTER — Other Ambulatory Visit: Payer: Self-pay

## 2024-02-13 ENCOUNTER — Ambulatory Visit
Admission: RE | Admit: 2024-02-13 | Discharge: 2024-02-13 | Disposition: A | Discharge status: Home / Self Care | Source: Ambulatory Visit | Attending: Radiation Oncology | Admitting: Radiation Oncology

## 2024-02-13 ENCOUNTER — Encounter: Payer: Self-pay | Admitting: Internal Medicine

## 2024-02-13 DIAGNOSIS — Z5111 Encounter for antineoplastic chemotherapy: Secondary | ICD-10-CM | POA: Diagnosis not present

## 2024-02-13 DIAGNOSIS — C342 Malignant neoplasm of middle lobe, bronchus or lung: Secondary | ICD-10-CM | POA: Diagnosis not present

## 2024-02-13 LAB — RAD ONC ARIA SESSION SUMMARY
Course Elapsed Days: 42
Plan Fractions Treated to Date: 31
Plan Prescribed Dose Per Fraction: 2 Gy
Plan Total Fractions Prescribed: 35
Plan Total Prescribed Dose: 70 Gy
Reference Point Dosage Given to Date: 62 Gy
Reference Point Session Dosage Given: 2 Gy
Session Number: 31

## 2024-02-13 NOTE — Telephone Encounter (Signed)
 Scripts faxed last week

## 2024-02-14 ENCOUNTER — Other Ambulatory Visit: Payer: Self-pay

## 2024-02-14 ENCOUNTER — Ambulatory Visit
Admission: RE | Admit: 2024-02-14 | Discharge: 2024-02-14 | Disposition: A | Discharge status: Home / Self Care | Source: Ambulatory Visit | Attending: Radiation Oncology | Admitting: Radiation Oncology

## 2024-02-14 DIAGNOSIS — Z5111 Encounter for antineoplastic chemotherapy: Secondary | ICD-10-CM | POA: Diagnosis not present

## 2024-02-14 DIAGNOSIS — C342 Malignant neoplasm of middle lobe, bronchus or lung: Secondary | ICD-10-CM | POA: Diagnosis not present

## 2024-02-14 LAB — RAD ONC ARIA SESSION SUMMARY
Course Elapsed Days: 43
Plan Fractions Treated to Date: 32
Plan Prescribed Dose Per Fraction: 2 Gy
Plan Total Fractions Prescribed: 35
Plan Total Prescribed Dose: 70 Gy
Reference Point Dosage Given to Date: 64 Gy
Reference Point Session Dosage Given: 2 Gy
Session Number: 32

## 2024-02-15 ENCOUNTER — Ambulatory Visit
Admission: RE | Admit: 2024-02-15 | Discharge: 2024-02-15 | Disposition: A | Discharge status: Home / Self Care | Source: Ambulatory Visit | Attending: Radiation Oncology | Admitting: Radiation Oncology

## 2024-02-15 ENCOUNTER — Other Ambulatory Visit: Payer: Self-pay

## 2024-02-15 DIAGNOSIS — C342 Malignant neoplasm of middle lobe, bronchus or lung: Secondary | ICD-10-CM | POA: Diagnosis not present

## 2024-02-15 DIAGNOSIS — Z5111 Encounter for antineoplastic chemotherapy: Secondary | ICD-10-CM | POA: Diagnosis not present

## 2024-02-15 LAB — RAD ONC ARIA SESSION SUMMARY
Course Elapsed Days: 44
Plan Fractions Treated to Date: 33
Plan Prescribed Dose Per Fraction: 2 Gy
Plan Total Fractions Prescribed: 35
Plan Total Prescribed Dose: 70 Gy
Reference Point Dosage Given to Date: 66 Gy
Reference Point Session Dosage Given: 2 Gy
Session Number: 33

## 2024-02-16 ENCOUNTER — Other Ambulatory Visit: Payer: Self-pay

## 2024-02-16 ENCOUNTER — Inpatient Hospital Stay (HOSPITAL_BASED_OUTPATIENT_CLINIC_OR_DEPARTMENT_OTHER): Admitting: Internal Medicine

## 2024-02-16 ENCOUNTER — Inpatient Hospital Stay

## 2024-02-16 ENCOUNTER — Encounter: Payer: Self-pay | Admitting: Internal Medicine

## 2024-02-16 ENCOUNTER — Ambulatory Visit
Admission: RE | Admit: 2024-02-16 | Discharge: 2024-02-16 | Disposition: A | Discharge status: Home / Self Care | Source: Ambulatory Visit | Attending: Radiation Oncology | Admitting: Radiation Oncology

## 2024-02-16 VITALS — BP 118/80 | HR 100 | Temp 97.9°F | Resp 20 | Wt 133.0 lb

## 2024-02-16 DIAGNOSIS — C3491 Malignant neoplasm of unspecified part of right bronchus or lung: Secondary | ICD-10-CM

## 2024-02-16 DIAGNOSIS — Z5111 Encounter for antineoplastic chemotherapy: Secondary | ICD-10-CM

## 2024-02-16 DIAGNOSIS — C342 Malignant neoplasm of middle lobe, bronchus or lung: Secondary | ICD-10-CM | POA: Diagnosis not present

## 2024-02-16 LAB — CBC WITH DIFFERENTIAL (CANCER CENTER ONLY)
Abs Immature Granulocytes: 0.04 10*3/uL (ref 0.00–0.07)
Basophils Absolute: 0 10*3/uL (ref 0.0–0.1)
Basophils Relative: 1 %
Eosinophils Absolute: 0 10*3/uL (ref 0.0–0.5)
Eosinophils Relative: 0 %
HCT: 33.9 % — ABNORMAL LOW (ref 39.0–52.0)
Hemoglobin: 11.1 g/dL — ABNORMAL LOW (ref 13.0–17.0)
Immature Granulocytes: 2 %
Lymphocytes Relative: 11 %
Lymphs Abs: 0.2 10*3/uL — ABNORMAL LOW (ref 0.7–4.0)
MCH: 27 pg (ref 26.0–34.0)
MCHC: 32.7 g/dL (ref 30.0–36.0)
MCV: 82.5 fL (ref 80.0–100.0)
Monocytes Absolute: 0.3 10*3/uL (ref 0.1–1.0)
Monocytes Relative: 15 %
Neutro Abs: 1.4 10*3/uL — ABNORMAL LOW (ref 1.7–7.7)
Neutrophils Relative %: 71 %
Platelet Count: 122 10*3/uL — ABNORMAL LOW (ref 150–400)
RBC: 4.11 MIL/uL — ABNORMAL LOW (ref 4.22–5.81)
RDW: 21.5 % — ABNORMAL HIGH (ref 11.5–15.5)
WBC Count: 2 10*3/uL — ABNORMAL LOW (ref 4.0–10.5)
nRBC: 1 % — ABNORMAL HIGH (ref 0.0–0.2)

## 2024-02-16 LAB — RAD ONC ARIA SESSION SUMMARY
Course Elapsed Days: 45
Plan Fractions Treated to Date: 34
Plan Prescribed Dose Per Fraction: 2 Gy
Plan Total Fractions Prescribed: 35
Plan Total Prescribed Dose: 70 Gy
Reference Point Dosage Given to Date: 68 Gy
Reference Point Session Dosage Given: 2 Gy
Session Number: 34

## 2024-02-16 LAB — CMP (CANCER CENTER ONLY)
ALT: 88 U/L — ABNORMAL HIGH (ref 0–44)
AST: 65 U/L — ABNORMAL HIGH (ref 15–41)
Albumin: 2.9 g/dL — ABNORMAL LOW (ref 3.5–5.0)
Alkaline Phosphatase: 63 U/L (ref 38–126)
Anion gap: 7 (ref 5–15)
BUN: 15 mg/dL (ref 6–20)
CO2: 24 mmol/L (ref 22–32)
Calcium: 8.6 mg/dL — ABNORMAL LOW (ref 8.9–10.3)
Chloride: 101 mmol/L (ref 98–111)
Creatinine: 0.76 mg/dL (ref 0.61–1.24)
GFR, Estimated: >60 mL/min (ref >60)
Glucose, Bld: 111 mg/dL — ABNORMAL HIGH (ref 70–99)
Potassium: 4.4 mmol/L (ref 3.5–5.1)
Sodium: 132 mmol/L — ABNORMAL LOW (ref 135–145)
Total Bilirubin: 0.8 mg/dL (ref 0.0–1.2)
Total Protein: 6.1 g/dL — ABNORMAL LOW (ref 6.5–8.1)

## 2024-02-16 MED ORDER — PALONOSETRON HCL INJECTION 0.25 MG/5ML
0.2500 mg | Freq: Once | INTRAVENOUS | Status: AC
  Administered 2024-02-16: 0.25 mg via INTRAVENOUS

## 2024-02-16 MED ORDER — SODIUM CHLORIDE 0.9 % IV SOLN
INTRAVENOUS | Status: DC
  Filled 2024-02-16: qty 250

## 2024-02-16 MED ORDER — DIPHENHYDRAMINE HCL 50 MG/ML IJ SOLN
50.0000 mg | Freq: Once | INTRAMUSCULAR | Status: AC
Start: 2024-02-16 — End: 2024-02-16
  Administered 2024-02-16: 50 mg via INTRAVENOUS

## 2024-02-16 MED ORDER — SODIUM CHLORIDE 0.9 % IV SOLN
45.0000 mg/m2 | Freq: Once | INTRAVENOUS | Status: AC
  Administered 2024-02-16: 78 mg via INTRAVENOUS
  Filled 2024-02-16: qty 13

## 2024-02-16 MED ORDER — HEPARIN SOD (PORK) LOCK FLUSH 100 UNIT/ML IV SOLN
500.0000 [IU] | Freq: Once | INTRAVENOUS | Status: DC | PRN
Start: 2024-02-16 — End: 2024-02-16
  Filled 2024-02-16: qty 5

## 2024-02-16 MED ORDER — DEXAMETHASONE SODIUM PHOSPHATE 10 MG/ML IJ SOLN
10.0000 mg | Freq: Once | INTRAMUSCULAR | Status: AC
  Administered 2024-02-16: 10 mg via INTRAVENOUS

## 2024-02-16 MED ORDER — FAMOTIDINE IN NACL 20-0.9 MG/50ML-% IV SOLN
20.0000 mg | Freq: Once | INTRAVENOUS | Status: AC
  Administered 2024-02-16: 20 mg via INTRAVENOUS

## 2024-02-16 MED ORDER — SODIUM CHLORIDE 0.9 % IV SOLN
223.8000 mg | Freq: Once | INTRAVENOUS | Status: AC
  Administered 2024-02-16: 220 mg via INTRAVENOUS
  Filled 2024-02-16: qty 22

## 2024-02-16 NOTE — Progress Notes (Signed)
 Patient says that the hiccups have went away since taking the medication that Dr. Aris Bel prescribed. He is doing well, and today is his last treatment, so he would like for Dr. Aris Bel and myself to be out there when he goes to ring the bell.

## 2024-02-16 NOTE — Progress Notes (Signed)
 DISCONTINUE ON PATHWAY REGIMEN - Non-Small Cell Lung     A cycle is every 7 days, concurrent with RT:     Paclitaxel       Carboplatin    **Always confirm dose/schedule in your pharmacy ordering system**  REASON: Continuation Of Treatment PRIOR TREATMENT: [id number]: Carboplatin  AUC=2 + Paclitaxel  45 mg/m2 Weekly During Radiation TREATMENT RESPONSE: Unable to Evaluate  START ON PATHWAY REGIMEN - Non-Small Cell Lung     A cycle is every 28 days:     Durvalumab   **Always confirm dose/schedule in your pharmacy ordering system**  Patient Characteristics: Preoperative or Nonsurgical Candidate (Clinical Staging), Stage IIB (N2a only) or Stage III - Nonsurgical Candidate, PS = 0,1 Therapeutic Status: Preoperative or Nonsurgical Candidate (Clinical Staging) AJCC T Category: cT4 AJCC N Category: cN1 AJCC M Category: cM0 AJCC 9 Stage Grouping: IIIA Check here if patient was staged using an edition other than AJCC Staging 9th Edition: true ECOG Performance Status: 1 Intent of Therapy: Curative Intent, Discussed with Patient

## 2024-02-16 NOTE — Progress Notes (Signed)
 [facility] CONSULT NOTE  Patient Care Team: Pardue, Asencion Blacksmith, DO as PCP - General (Family Medicine) Drake Gens, RN as Oncology Nurse Navigator Glenis Langdon, MD as Consulting Physician (Radiation Oncology)  Reason for visit-stage IIIa lung adenocarcinoma  CANCER STAGING   Cancer Staging  Lung cancer [facility]) Staging form: Lung, AJCC V9 - Clinical: Stage IIIA (cT4, cN1, cM0) - Signed by Brighten Buzzelli, MD on 11/24/2023   ASSESSMENT & PLAN:  Nicholas Mccoy 58 y.o. male with pmh of alcohol use, hepatitis C, hyperlipidemia, hypertension referred to medical oncology for right lung mass.  # Right lung adenocarcinoma, stage IIIA  -PET scan showed 7.3 x 4.9 cm hypermetabolic large right lung mass SUV 23.1.  Prominent right hilar mass or lymph node with SUV 12.1 measures 3.4 x 2.9 cm.  No metastatic disease outside of the chest.  -Status post EBUS with biopsy showed lung adenocarcinoma from right lung upper lobe mass FNA and brushing.  Lymph node station 7, 11R superior and inferior negative for malignancy.  -Tempus testing was reviewed with no targetable mutations.  PD-L1 5%.  -MRI brain negative for metastatic disease.  -Continue with concurrent chemo RT. labs reviewed and acceptable for treatment.  ANC of 1.4 noted.  Will proceed with cycle 7 of CarboTaxol which will be the last treatment.  He will finish radiation tomorrow.  Repeat CBC in 1 week.  In 2 weeks, we will schedule for maintenance Durvalumab 1500 mg every 4 weeks for total 1 year.  Denies any history of autoimmune disease.  Patient states he is scheduled to see Dr. Jacalyn Martin tomorrow who will schedule for CT imaging.  If not, we will have that scheduled at the next visit to assess treatment response..  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  # Active smoker - Team will provide him with the number to call for smoking cessation  program.  # Hepatitis C positive # Mild transaminitis -Follows with Dr. Antony Baumgartner.  He is not on active treatment.   Orders Placed This Encounter  Procedures   Consent Attestation for Oncology Treatment    The patient is informed of risks, benefits, side-effects of the prescribed oncology treatment. Potential short term and long term side effects and response rates discussed. After a long discussion, the patient made informed decision to proceed.:   Yes   CBC with Differential (Cancer Center Only)    Standing Status:   Future    Expected Date:   02/22/2024    Expiration Date:   02/15/2025   CBC with Differential (Cancer Center Only)    Standing Status:   Future    Expected Date:   03/01/2024    Expiration Date:   03/01/2025   CMP (Cancer Center only)    Standing Status:   Future    Expected Date:   03/01/2024    Expiration Date:   03/01/2025   T4    Standing Status:   Future    Expected Date:   03/01/2024    Expiration Date:   03/01/2025   TSH    Standing Status:   Future    Expected Date:   03/01/2024    Expiration Date:   03/01/2025   PHYSICIAN COMMUNICATION ORDER    Thyroid function tests at baseline and every 3rd cycle   RTC as scheduled.  The total time spent in the appointment was 30 minutes encounter with patients including review of  chart and various tests results, discussions about plan of care and coordination of care plan   All questions were answered. The patient knows to call the clinic with any problems, questions or concerns. No barriers to learning was detected.  Loreatha Rodney, MD 5/8/20253:34 PM   HISTORY OF PRESENTING ILLNESS:  Nicholas Mccoy 58 y.o. male with pmh of alcohol use, hepatitis C, hyperlipidemia, hypertension referred to medical oncology for right lung mass.  Patient presented to [facility] ED on 10/25/2023 with complaints of chest pain.  CTA chest showed large enhancing mass in the right lung abutting the pleura appears predominantly located in the  posterior aspect of right middle lobe measures 7 x 6.4 x 7 cm.  Right inferior hilar nodule measures 2.7 cm suspicious for nodal involvement.  Encases and severely narrows the right middle lobe bronchus with mass effect and displacement of right superior pulmonary vein.  Additional borderline enlarged right superior hilar lymph node 1 cm.  Patient is an active smoker for several years.  Smokes half to 1 pack a day.  Interval history Patient was seen today as follow-up on concurrent chemo RT for stage III lung cancer. Patient is doing well overall.  Excited about the last treatment today.  His acid reflux is improved with Protonix .  Denies any other concerns.  I have reviewed his chart and materials related to his cancer extensively and collaborated history with the patient. Summary of oncologic history is as follows: Oncology History  Lung cancer (HCC)  11/09/2023 PET scan   FINDINGS: Mediastinal blood pool activity: SUV max 1.6   Liver activity: SUV max NA   NECK: No significant abnormal hypermetabolic activity in this region.   Incidental CT findings: Mild chronic left maxillary sinusitis.   CHEST: Large right lung mass primarily centered in the right middle lobe although based on recent CT this is likely to be crossing the minor fissure into the right upper lobe and potentially crossing the major fissure into the right lower lobe, maximum SUV 23.1, the associated hypermetabolic activity measures about 7.3 by 4.9 cm. The mass abuts the right pleural margin laterally without pleural effusion or separate pleural hypermetabolic activity.   There is also a prominent right hilar mass or lymph node with maximum SUV 12.1 measuring about 3.4 by 2.9 cm.   Postobstructive atelectasis in the right middle lobe.   Incidental CT findings: Scattered paraseptal bulla at the lung apices.   ABDOMEN/PELVIS: No significant abnormal hypermetabolic activity in this region.   Incidental CT  findings: Small benign calcified lesion of the left posteroinferior spleen. Atherosclerosis is present, including aortoiliac atherosclerotic disease.   SKELETON: No significant abnormal hypermetabolic activity in this region.   Incidental CT findings: None.   IMPRESSION: 1. Large right lung mass primarily centered in the right middle lobe although based on recent CT this is likely crossing the minor fissure into the right upper lobe and potentially crossing the major fissure into the right lower lobe. 2. Prominent right hilar mass or lymph node with maximum SUV 12.1 measuring about 3.4 by 2.9 cm. 3. No findings of distant metastatic disease. 4. Mild chronic left maxillary sinusitis. 5. Scattered paraseptal bulla at the lung apices. 6. Aortic atherosclerosis.   11/17/2023 Procedure   S/p EBUS with biopsy with Dr. Lucina Sabal.   FINAL MICROSCOPIC DIAGNOSIS:  A.  RIGHT LUNG, UPPER LOBE, MASS, FINE NEEDLE ASPIRATION  BIOPSY:  - Malignant  - Adenocarcinoma   B.  RIGHT LUNG, UPPER LOBE, MASS, BRUSHING:  -  Malignant  - Adenocarcinoma   Specimen Submitted:  C. LUNG, RUL, LAVAGE:    FINAL MICROSCOPIC DIAGNOSIS:  - Atypical cells present  - Rare atypical large cells suspicious for tumor   FINAL MICROSCOPIC DIAGNOSIS:  D. LYMPH NODE, STATION 7, FINE NEEDLE ASPIRATION:  - Negative for malignancy  - Compatible with benign lymph node   E. LYMPH NODE, STATION 11R SUPERIOR, FINE NEEDLE ASPIRATION:  - Negative for malignancy  - Compatible with a benign lymph node   F. LYMPH NODE, STATION 11R INFERIOR, FINE NEEDLE ASPIRATION:  - Negative for malignancy  - Compatible with a benign lymph node      11/24/2023 Cancer Staging   Staging form: Lung, AJCC V9 - Clinical: Stage IIIA (cT4, cN1, cM0) - Signed by Loreatha Rodney, MD on 11/24/2023   12/08/2023 - 12/08/2023 Chemotherapy   Patient is on Treatment Plan : LUNG Carboplatin  + Paclitaxel  + XRT q7d     12/15/2023 - 12/15/2023 Chemotherapy    Patient is on Treatment Plan : LUNG Carboplatin  (5) + Pemetrexed (500) + Pembrolizumab (200) D1 q21d Induction x 4 cycles / Maintenance Pemetrexed (500) + Pembrolizumab (200) D1 q21d     01/05/2024 - 02/16/2024 Chemotherapy   Patient is on Treatment Plan : LUNG Carboplatin  + Paclitaxel  + XRT q7d     03/01/2024 -  Chemotherapy   Patient is on Treatment Plan : LUNG NSCLC Durvalumab (1500) q28d       MEDICAL HISTORY:  Past Medical History:  Diagnosis Date   Alcohol abuse    Alcohol abuse, daily use 05/04/2023   Hepatitis C    Hyperlipidemia    Hypertension    Pneumonia    x several    SURGICAL HISTORY: Past Surgical History:  Procedure Laterality Date   BRONCHIAL BIOPSY  11/17/2023   Procedure: BRONCHIAL BIOPSIES;  Surgeon: Annitta Kindler, MD;  Location: [facility] ENDOSCOPY;  Service: Pulmonary;;   BRONCHIAL BRUSHINGS  11/17/2023   Procedure: BRONCHIAL BRUSHINGS;  Surgeon: Annitta Kindler, MD;  Location: [facility] ENDOSCOPY;  Service: Pulmonary;;   BRONCHIAL NEEDLE ASPIRATION BIOPSY  11/17/2023   Procedure: BRONCHIAL NEEDLE ASPIRATION BIOPSIES;  Surgeon: Annitta Kindler, MD;  Location: [facility] ENDOSCOPY;  Service: Pulmonary;;   BRONCHIAL WASHINGS  11/17/2023   Procedure: BRONCHIAL WASHINGS;  Surgeon: Annitta Kindler, MD;  Location: [facility] ENDOSCOPY;  Service: Pulmonary;;   COLONOSCOPY WITH PROPOFOL  N/A 06/28/2023   Procedure: COLONOSCOPY WITH PROPOFOL ;  Surgeon: Luke Salaam, MD;  Location[facility] ENDOSCOPY;  Service: Gastroenterology;  Laterality: N/A;   ENDOBRONCHIAL ULTRASOUND Bilateral 11/17/2023   Procedure: ENDOBRONCHIAL ULTRASOUND;  Surgeon: Annitta Kindler, MD;  Location: [facility] ENDOSCOPY;  Service: Pulmonary;  Laterality: Bilateral;   FINE NEEDLE ASPIRATION  11/17/2023   Procedure: FINE NEEDLE ASPIRATION (FNA) LINEAR;  Surgeon: Annitta Kindler, MD;  Location: [facility] ENDOSCOPY;  Service: Pulmonary;;   HEMOSTASIS CONTROL  11/17/2023   Procedure: HEMOSTASIS CONTROL;  Surgeon: Annitta Kindler, MD;  Location[facility] ENDOSCOPY;  Service: Pulmonary;;   IR IMAGING GUIDED PORT INSERTION  12/13/2023   POLYPECTOMY  06/28/2023   Procedure: POLYPECTOMY;  Surgeon: Luke Salaam, MD;  Location: [facility] ENDOSCOPY;  Service: Gastroenterology;;   VIDEO BRONCHOSCOPY WITH RADIAL ENDOBRONCHIAL ULTRASOUND  11/17/2023   Procedure: VIDEO BRONCHOSCOPY WITH RADIAL ENDOBRONCHIAL ULTRASOUND;  Surgeon: Annitta Kindler, MD;  Location: [facility] ENDOSCOPY;  Service: Pulmonary;;    SOCIAL HISTORY: Social History   Socioeconomic History   Marital status: Divorced    Spouse name: Not on file   Number of children: Not on file   Years  of education: Not on file   Highest education level: 10th grade  Occupational History   Not on file  Tobacco Use   Smoking status: Every Day    Current packs/day: 0.50    Average packs/day: 0.5 packs/day for 25.0 years (12.5 ttl pk-yrs)    Types: Cigarettes   Smokeless tobacco: Never   Tobacco comments:    Started smoking at 58 years old.    Smoked 1 PPD at his heaviest.    Now smokes 3 cigarettes. Khj 11/09/2023  Vaping Use   Vaping status: Never Used  Substance and Sexual Activity   Alcohol use: Not Currently    Comment: stopped drinking 11/2017   Drug use: No   Sexual activity: Yes  Other Topics Concern   Not on file  Social History Narrative   Not on file   Social Drivers of Health   Financial Resource Strain: Low Risk  (07/25/2023)   Overall Financial Resource Strain (CARDIA)    Difficulty of Paying Living Expenses: Not very hard  Food Insecurity: No Food Insecurity (11/03/2023)   Hunger Vital Sign    Worried About Running Out of Food in the Last Year: Never true    Ran Out of Food in the Last Year: Never true  Transportation Needs: No Transportation Needs (07/25/2023)   PRAPARE - Administrator, Civil Service (Medical): No    Lack of Transportation (Non-Medical): No  Physical Activity: Insufficiently Active (07/25/2023)   Exercise Vital  Sign    Days of Exercise per Week: 5 days    Minutes of Exercise per Session: 10 min  Stress: No Stress Concern Present (07/25/2023)   [employer] of Occupational Health - Occupational Stress Questionnaire    Feeling of Stress : Not at all  Social Connections: Moderately Isolated (07/25/2023)   Social Connection and Isolation Panel [NHANES]    Frequency of Communication with Friends and Family: Three times a week    Frequency of Social Gatherings with Friends and Family: Once a week    Attends Religious Services: More than 4 times per year    Active Member of [employer] or Organizations: No    Attends Engineer, structural: Not on file    Marital Status: Divorced  Intimate Partner Violence: Not At Risk (11/03/2023)   Humiliation, Afraid, Rape, and Kick questionnaire    Fear of Current or Ex-Partner: No    Emotionally Abused: No    Physically Abused: No    Sexually Abused: No    FAMILY HISTORY: Family History  Problem Relation Age of Onset   Hypertension Father    Heart disease Father    Heart attack Father    Heart attack Sister    Hypertension Brother    Healthy Brother    Sickle cell anemia Sister    Hypertension Mother    Hypertension Maternal Grandmother    Hypertension Maternal Grandfather    Hypertension Paternal Grandmother    Hypertension Paternal Grandfather     ALLERGIES:  has no known allergies.  MEDICATIONS:  Current Outpatient Medications  Medication Sig Dispense Refill   amLODipine  (NORVASC ) 5 MG tablet TAKE 1 TABLET (5 MG TOTAL) BY MOUTH DAILY. 90 tablet 1   dexamethasone  (DECADRON ) 4 MG tablet Take 2 tablets daily for 2 days, start the day after chemotherapy. Take with food. 30 tablet 1   gabapentin  (NEURONTIN ) 100 MG capsule Take 2 capsules (200 mg total) by mouth 3 (three) times daily for 20 days. 120 capsule 0  lactose free nutrition (BOOST PLUS) LIQD Take 237 mLs by mouth 3 (three) times daily with meals. 21330 mL 0   lidocaine -prilocaine   (EMLA ) cream Apply to affected area once 30 g 3   ondansetron  (ZOFRAN ) 8 MG tablet Take 1 tablet (8 mg total) by mouth every 8 (eight) hours as needed for nausea or vomiting. Start on the third day after chemotherapy. 30 tablet 1   pantoprazole  (PROTONIX ) 40 MG tablet TAKE 1 TABLET BY MOUTH EVERY DAY 90 tablet 0   prochlorperazine  (COMPAZINE ) 10 MG tablet Take 1 tablet (10 mg total) by mouth every 6 (six) hours as needed for nausea or vomiting. 30 tablet 1   rosuvastatin  (CRESTOR ) 5 MG tablet TAKE 1 TABLET (5 MG TOTAL) BY MOUTH DAILY. (Patient taking differently: Take 5 mg by mouth at bedtime.) 90 tablet 1   varenicline  (CHANTIX ) 0.5 MG tablet Days 1 to 3- 0.5 mg once daily, Days 4 to 7-0.5 mg twice daily, Maintenance day 8 and later 1 mg twice daily. If side effects, reduce dose to previously tolerated dose. 60 tablet 0   No current facility-administered medications for this visit.    REVIEW OF SYSTEMS:   Pertinent information mentioned in HPI All other systems were reviewed with the patient and are negative.  PHYSICAL EXAMINATION: ECOG PERFORMANCE STATUS: 1 - Symptomatic but completely ambulatory  Vitals:   02/16/24 1057  BP: 118/80  Pulse: 100  Resp: 20  Temp: 97.9 F (36.6 C)  SpO2: 100%    Filed Weights   02/16/24 1057  Weight: 133 lb (60.3 kg)     GENERAL:alert, no distress and comfortable SKIN: skin color, texture, turgor are normal, no rashes or significant lesions EYES: normal, conjunctiva are pink and non-injected, sclera clear OROPHARYNX:no exudate, no erythema and lips, buccal mucosa, and tongue normal  NECK: supple, thyroid normal size, non-tender, without nodularity LYMPH:  no palpable lymphadenopathy in the cervical, axillary or inguinal LUNGS: clear to auscultation and percussion with normal breathing effort HEART: regular rate & rhythm and no murmurs and no lower extremity edema ABDOMEN:abdomen soft, non-tender and normal bowel sounds Musculoskeletal:no  cyanosis of digits and no clubbing  PSYCH: alert & oriented x 3 with fluent speech NEURO: no focal motor/sensory deficits  LABORATORY DATA:  I have reviewed the data as listed Lab Results  Component Value Date   WBC 2.0 (L) 02/16/2024   HGB 11.1 (L) 02/16/2024   HCT 33.9 (L) 02/16/2024   MCV 82.5 02/16/2024   PLT 122 (L) 02/16/2024   Recent Labs    02/02/24 1033 02/09/24 1030 02/16/24 1042  NA 134* 134* 132*  K 4.0 4.3 4.4  CL 103 102 101  CO2 24 25 24   GLUCOSE 87 95 111*  BUN 10 11 15   CREATININE 0.63 0.64 0.76  CALCIUM  8.8* 8.8* 8.6*  GFRNONAA >60 >60 >60  PROT 6.5 6.0* 6.1*  ALBUMIN 3.0* 3.1* 2.9*  AST 57* 87* 65*  ALT 73* 91* 88*  ALKPHOS 67 70 63  BILITOT 0.6 0.7 0.8    RADIOGRAPHIC STUDIES: I have personally reviewed the radiological images as listed and agreed with the findings in the report. No results found.

## 2024-02-16 NOTE — Patient Instructions (Signed)
 [facility]  Discharge Instructions: Thank you for choosing [facility] to provide your oncology and hematology care.  If you have a lab appointment with the Cancer Center, please go directly to the Cancer Center and check in at the registration area.  Wear comfortable clothing and clothing appropriate for easy access to any Portacath or PICC line.   We strive to give you quality time with your provider. You may need to reschedule your appointment if you arrive late (15 or more minutes).  Arriving late affects you and other patients whose appointments are after yours.  Also, if you miss three or more appointments without notifying the office, you may be dismissed from the clinic at the provider's discretion.      For prescription refill requests, have your pharmacy contact our office and allow 72 hours for refills to be completed.    Today you received the following chemotherapy and/or immunotherapy agents TAXOL and CARBOPLATIN       To help prevent nausea and vomiting after your treatment, we encourage you to take your nausea medication as directed.  BELOW ARE SYMPTOMS THAT SHOULD BE REPORTED IMMEDIATELY: *FEVER GREATER THAN 100.4 F (38 C) OR HIGHER *CHILLS OR SWEATING *NAUSEA AND VOMITING THAT IS NOT CONTROLLED WITH YOUR NAUSEA MEDICATION *UNUSUAL SHORTNESS OF BREATH *UNUSUAL BRUISING OR BLEEDING *URINARY PROBLEMS (pain or burning when urinating, or frequent urination) *BOWEL PROBLEMS (unusual diarrhea, constipation, pain near the anus) TENDERNESS IN MOUTH AND THROAT WITH OR WITHOUT PRESENCE OF ULCERS (sore throat, sores in mouth, or a toothache) UNUSUAL RASH, SWELLING OR PAIN  UNUSUAL VAGINAL DISCHARGE OR ITCHING   Items with * indicate a potential emergency and should be followed up as soon as possible or go to the Emergency Department if any problems should occur.  Please show the CHEMOTHERAPY ALERT CARD or  IMMUNOTHERAPY ALERT CARD at check-in to the Emergency Department and triage nurse.  Should you have questions after your visit or need to cancel or reschedule your appointment, please contact [facility] - A DEPT OF [facility]  [phone] and follow the prompts.  Office hours are 8:00 a.m. to 4:30 p.m. Monday - Friday. Please note that voicemails left after 4:00 p.m. may not be returned until the following business day.  We are closed weekends and major holidays. You have access to a nurse at all times for urgent questions. Please call the main number to the clinic [phone] and follow the prompts.  For any non-urgent questions, you may also contact your provider using MyChart. We now offer e-Visits for anyone 83 and older to request care online for non-urgent symptoms. For details visit [email or web address].   Also download the MyChart app! Go to the app store, search "MyChart", open the app, select [place], and log in with your MyChart username and password.  Paclitaxel Injection What is this medication? PACLITAXEL (PAK li TAX el) treats some types of cancer. It works by slowing down the growth of cancer cells. This medicine may be used for other purposes; ask your health care provider or pharmacist if you have questions. COMMON BRAND NAME(S): Onxol, Taxol What should I tell my care team before I take this medication? They need to know if you have any of these conditions: Heart disease Liver disease Low white blood cell levels An unusual or allergic reaction to paclitaxel, other medications, foods, dyes, or preservatives  If you or your partner are pregnant or trying to get pregnant Breast-feeding How should I use this medication? This medication is injected into a vein. It is given by your care team in a hospital or clinic setting. Talk to your care team about the use of this medication in children. While it may be given to children for selected  conditions, precautions do apply. Overdosage: If you think you have taken too much of this medicine contact a poison control center or emergency room at once. NOTE: This medicine is only for you. Do not share this medicine with others. What if I miss a dose? Keep appointments for follow-up doses. It is important not to miss your dose. Call your care team if you are unable to keep an appointment. What may interact with this medication? Do not take this medication with any of the following: Live virus vaccines Other medications may affect the way this medication works. Talk with your care team about all of the medications you take. They may suggest changes to your treatment plan to lower the risk of side effects and to make sure your medications work as intended. This list may not describe all possible interactions. Give your health care provider a list of all the medicines, herbs, non-prescription drugs, or dietary supplements you use. Also tell them if you smoke, drink alcohol, or use illegal drugs. Some items may interact with your medicine. What should I watch for while using this medication? Your condition will be monitored carefully while you are receiving this medication. You may need blood work while taking this medication. This medication may make you feel generally unwell. This is not uncommon as chemotherapy can affect healthy cells as well as cancer cells. Report any side effects. Continue your course of treatment even though you feel ill unless your care team tells you to stop. This medication can cause serious allergic reactions. To reduce the risk, your care team may give you other medications to take before receiving this one. Be sure to follow the directions from your care team. This medication may increase your risk of getting an infection. Call your care team for advice if you get a fever, chills, sore throat, or other symptoms of a cold or flu. Do not treat yourself. Try to avoid  being around people who are sick. This medication may increase your risk to bruise or bleed. Call your care team if you notice any unusual bleeding. Be careful brushing or flossing your teeth or using a toothpick because you may get an infection or bleed more easily. If you have any dental work done, tell your dentist you are receiving this medication. Talk to your care team if you may be pregnant. Serious birth defects can occur if you take this medication during pregnancy. Talk to your care team before breastfeeding. Changes to your treatment plan may be needed. What side effects may I notice from receiving this medication? Side effects that you should report to your care team as soon as possible: Allergic reactions--skin rash, itching, hives, swelling of the face, lips, tongue, or throat Heart rhythm changes--fast or irregular heartbeat, dizziness, feeling faint or lightheaded, chest pain, trouble breathing Increase in blood pressure Infection--fever, chills, cough, sore throat, wounds that don't heal, pain or trouble when passing urine, general feeling of discomfort or being unwell Low blood pressure--dizziness, feeling faint or lightheaded, blurry vision Low red blood cell level--unusual weakness or fatigue, dizziness, headache, trouble breathing Painful swelling, warmth, or redness of the skin, blisters  or sores at the infusion site Pain, tingling, or numbness in the hands or feet Slow heartbeat--dizziness, feeling faint or lightheaded, confusion, trouble breathing, unusual weakness or fatigue Unusual bruising or bleeding Side effects that usually do not require medical attention (report to your care team if they continue or are bothersome): Diarrhea Hair loss Joint pain Loss of appetite Muscle pain Nausea Vomiting This list may not describe all possible side effects. Call your doctor for medical advice about side effects. You may report side effects to FDA at 1-800-FDA-1088. Where  should I keep my medication? This medication is given in a hospital or clinic. It will not be stored at home. NOTE: This sheet is a summary. It may not cover all possible information. If you have questions about this medicine, talk to your doctor, pharmacist, or health care provider.  2024 Elsevier/Gold Standard (2022-02-16 00:00:00)  Carboplatin Injection What is this medication? CARBOPLATIN (KAR boe pla tin) treats some types of cancer. It works by slowing down the growth of cancer cells. This medicine may be used for other purposes; ask your health care provider or pharmacist if you have questions. COMMON BRAND NAME(S): Paraplatin What should I tell my care team before I take this medication? They need to know if you have any of these conditions: Blood disorders Hearing problems Kidney disease Recent or ongoing radiation therapy An unusual or allergic reaction to carboplatin, cisplatin, other medications, foods, dyes, or preservatives Pregnant or trying to get pregnant Breast-feeding How should I use this medication? This medication is injected into a vein. It is given by your care team in a hospital or clinic setting. Talk to your care team about the use of this medication in children. Special care may be needed. Overdosage: If you think you have taken too much of this medicine contact a poison control center or emergency room at once. NOTE: This medicine is only for you. Do not share this medicine with others. What if I miss a dose? Keep appointments for follow-up doses. It is important not to miss your dose. Call your care team if you are unable to keep an appointment. What may interact with this medication? Medications for seizures Some antibiotics, such as amikacin, gentamicin, neomycin, streptomycin, tobramycin Vaccines This list may not describe all possible interactions. Give your health care provider a list of all the medicines, herbs, non-prescription drugs, or dietary  supplements you use. Also tell them if you smoke, drink alcohol, or use illegal drugs. Some items may interact with your medicine. What should I watch for while using this medication? Your condition will be monitored carefully while you are receiving this medication. You may need blood work while taking this medication. This medication may make you feel generally unwell. This is not uncommon, as chemotherapy can affect healthy cells as well as cancer cells. Report any side effects. Continue your course of treatment even though you feel ill unless your care team tells you to stop. In some cases, you may be given additional medications to help with side effects. Follow all directions for their use. This medication may increase your risk of getting an infection. Call your care team for advice if you get a fever, chills, sore throat, or other symptoms of a cold or flu. Do not treat yourself. Try to avoid being around people who are sick. Avoid taking medications that contain aspirin, acetaminophen, ibuprofen, naproxen, or ketoprofen unless instructed by your care team. These medications may hide a fever. Be careful brushing or flossing your  teeth or using a toothpick because you may get an infection or bleed more easily. If you have any dental work done, tell your dentist you are receiving this medication. Talk to your care team if you wish to become pregnant or think you might be pregnant. This medication can cause serious birth defects. Talk to your care team about effective forms of contraception. Do not breast-feed while taking this medication. What side effects may I notice from receiving this medication? Side effects that you should report to your care team as soon as possible: Allergic reactions--skin rash, itching, hives, swelling of the face, lips, tongue, or throat Infection--fever, chills, cough, sore throat, wounds that don't heal, pain or trouble when passing urine, general feeling of  discomfort or being unwell Low red blood cell level--unusual weakness or fatigue, dizziness, headache, trouble breathing Pain, tingling, or numbness in the hands or feet, muscle weakness, change in vision, confusion or trouble speaking, loss of balance or coordination, trouble walking, seizures Unusual bruising or bleeding Side effects that usually do not require medical attention (report to your care team if they continue or are bothersome): Hair loss Nausea Unusual weakness or fatigue Vomiting This list may not describe all possible side effects. Call your doctor for medical advice about side effects. You may report side effects to FDA at 1-800-FDA-1088. Where should I keep my medication? This medication is given in a hospital or clinic. It will not be stored at home. NOTE: This sheet is a summary. It may not cover all possible information. If you have questions about this medicine, talk to your doctor, pharmacist, or health care provider.  2024 Elsevier/Gold Standard (2022-01-19 00:00:00)

## 2024-02-16 NOTE — Progress Notes (Signed)
 Nutrition Follow-up:  Patient with lung cancer.  Receiving concurrent chemotherapy and radiation.    Met with patient during infusion.  Last chemotherapy today and last radiation tomorrow.  Patient reports good appetite.  Has not heard from [facility] but it is early. Could take 2-3 weeks to process.  Reports that he does not eat late at night to help with GERD.  Continues to drink boost shakes     Medications: reviewed  Labs: reviewed  Anthropometrics:   Weight 133 lb (stable) 133 lb 9.6 oz on 5/1 132 lb 4.8 oz on 4/17 128 lb on 4/3 131 lb 4.8 oz on 3/4 140 lb on Jan 2025   NUTRITION DIAGNOSIS: Unintentional weight loss stable  Severe malnutrition continues   INTERVENTION:  Awaiting authorization from [employer] for [place] [employer] to pay for boost shakes Continue to push calories and protein to maintain weight    MONITORING, EVALUATION, GOAL: weight trends, intake   NEXT VISIT: as needed  Cameshia Cressman B. Zollie Hipp, CSO, LDN Registered Dietitian [phone]

## 2024-02-17 ENCOUNTER — Ambulatory Visit
Admission: RE | Admit: 2024-02-17 | Discharge: 2024-02-17 | Disposition: A | Discharge status: Home / Self Care | Source: Ambulatory Visit | Attending: Radiation Oncology | Admitting: Radiation Oncology

## 2024-02-17 ENCOUNTER — Other Ambulatory Visit: Payer: Self-pay

## 2024-02-17 DIAGNOSIS — C342 Malignant neoplasm of middle lobe, bronchus or lung: Secondary | ICD-10-CM | POA: Diagnosis not present

## 2024-02-17 DIAGNOSIS — Z5111 Encounter for antineoplastic chemotherapy: Secondary | ICD-10-CM | POA: Diagnosis not present

## 2024-02-17 LAB — RAD ONC ARIA SESSION SUMMARY
Course Elapsed Days: 46
Plan Fractions Treated to Date: 35
Plan Prescribed Dose Per Fraction: 2 Gy
Plan Total Fractions Prescribed: 35
Plan Total Prescribed Dose: 70 Gy
Reference Point Dosage Given to Date: 70 Gy
Reference Point Session Dosage Given: 2 Gy
Session Number: 35

## 2024-02-18 ENCOUNTER — Other Ambulatory Visit: Payer: Self-pay

## 2024-02-20 NOTE — Radiation Completion Notes (Signed)
 Patient Name: Nicholas Mccoy, Nicholas Mccoy MRN: [id number] Date of Birth: 1966-05-25 Referring Physician: KAVITA AGRAWAL, M.D. Date of Service: 2024-02-20 Radiation Oncologist: Glenis Langdon, M.D. [facility]                             RADIATION ONCOLOGY END OF TREATMENT NOTE     Diagnosis: C34.2 Malignant neoplasm of middle lobe, bronchus or lung Staging on 2023-11-24: Lung cancer (HCC) T=cT4, N=cN1, M=cM0 Intent: Curative     HPI: Patient is a 58 year old male who presented with sudden onset of chest pain and difficulty breathing.  Initial workup showed a right lung mass possibly pneumonia.  CT scan confirmed a right lung mass centered in the right middle lobe likely crossing the minor fissure.  There is also prominent right hilar adenopathy.  This was confirmed on a PET CT scan showing hypermetabolic 7.3 x 4.9 cm mass which abuts the right pleural margin.  There is also prominent right hilar adenopathy hypermetabolic measuring 3.4 x 2.9 cm.  There is also postobstructive atelectasis in the right middle lobe.  Patient underwent bronchoscopy with cytology positive for adenocarcinoma.  MRI of the brain is pending.  He has been referred to medical oncology stage IIIa disease with recommendation for concurrent chemoradiation.  He is seen today for radiation oncology opinion.  He specifically denies hemoptysis chest tightness does have a cough but fairly dry.  He is having no bone pain.      ==========DELIVERED PLANS==========  First Treatment Date: 2024-01-02 Last Treatment Date: 2024-02-17   Plan Name: Lung_R Site: Lung, Right Technique: IMRT Mode: Photon Dose Per Fraction: 2 Gy Prescribed Dose (Delivered / Prescribed): 70 Gy / 70 Gy Prescribed Fxs (Delivered / Prescribed): 35 / 35     ==========ON TREATMENT VISIT DATES========== 2024-01-03, 2024-01-10, 2024-01-17, 2024-01-24, 2024-01-31, 2024-02-07, 2024-02-14     ==========UPCOMING VISITS==========        ==========APPENDIX - ON TREATMENT VISIT NOTES==========   See weekly On Treatment Notes in Epic for details in the Media tab (listed as Progress notes on the On Treatment Visit Dates listed above).

## 2024-02-21 ENCOUNTER — Encounter: Payer: Self-pay | Admitting: Internal Medicine

## 2024-02-22 ENCOUNTER — Inpatient Hospital Stay

## 2024-02-22 DIAGNOSIS — Z5111 Encounter for antineoplastic chemotherapy: Secondary | ICD-10-CM | POA: Diagnosis not present

## 2024-02-22 DIAGNOSIS — C3491 Malignant neoplasm of unspecified part of right bronchus or lung: Secondary | ICD-10-CM

## 2024-02-22 DIAGNOSIS — C342 Malignant neoplasm of middle lobe, bronchus or lung: Secondary | ICD-10-CM | POA: Diagnosis not present

## 2024-02-22 LAB — CBC WITH DIFFERENTIAL (CANCER CENTER ONLY)
Abs Immature Granulocytes: 0.19 10*3/uL — ABNORMAL HIGH (ref 0.00–0.07)
Basophils Absolute: 0 10*3/uL (ref 0.0–0.1)
Basophils Relative: 1 %
Eosinophils Absolute: 0 10*3/uL (ref 0.0–0.5)
Eosinophils Relative: 0 %
HCT: 33.8 % — ABNORMAL LOW (ref 39.0–52.0)
Hemoglobin: 11.2 g/dL — ABNORMAL LOW (ref 13.0–17.0)
Immature Granulocytes: 8 %
Lymphocytes Relative: 14 %
Lymphs Abs: 0.3 10*3/uL — ABNORMAL LOW (ref 0.7–4.0)
MCH: 27.3 pg (ref 26.0–34.0)
MCHC: 33.1 g/dL (ref 30.0–36.0)
MCV: 82.4 fL (ref 80.0–100.0)
Monocytes Absolute: 0.3 10*3/uL (ref 0.1–1.0)
Monocytes Relative: 13 %
Neutro Abs: 1.5 10*3/uL — ABNORMAL LOW (ref 1.7–7.7)
Neutrophils Relative %: 64 %
Platelet Count: 132 10*3/uL — ABNORMAL LOW (ref 150–400)
RBC: 4.1 MIL/uL — ABNORMAL LOW (ref 4.22–5.81)
RDW: 22.6 % — ABNORMAL HIGH (ref 11.5–15.5)
Smear Review: NORMAL
WBC Count: 2.3 10*3/uL — ABNORMAL LOW (ref 4.0–10.5)
nRBC: 1.7 % — ABNORMAL HIGH (ref 0.0–0.2)

## 2024-02-23 NOTE — Progress Notes (Signed)
 Pharmacist Chemotherapy Monitoring - Initial Assessment    Anticipated start date: 03/01/24   The following has been reviewed per standard work regarding the patient's treatment regimen: The patient's diagnosis, treatment plan and drug doses, and organ/hematologic function Lab orders and baseline tests specific to treatment regimen  The treatment plan start date, drug sequencing, and pre-medications Prior authorization status  Patient's documented medication list, including drug-drug interaction screen and prescriptions for anti-emetics and supportive care specific to the treatment regimen The drug concentrations, fluid compatibility, administration routes, and timing of the medications to be used The patient's access for treatment and lifetime cumulative dose history, if applicable  The patient's medication allergies and previous infusion related reactions, if applicable   Changes made to treatment plan:  N/A  Follow up needed:  Rx for Dex 8 mg daily x 2 days starting day after chemo.  D/C now that off chemo?   Shalaina Guardiola, Pharm.D., CPP 02/23/2024@3 :07 PM

## 2024-02-28 ENCOUNTER — Encounter (INDEPENDENT_AMBULATORY_CARE_PROVIDER_SITE_OTHER): Payer: Self-pay

## 2024-03-01 ENCOUNTER — Inpatient Hospital Stay (HOSPITAL_BASED_OUTPATIENT_CLINIC_OR_DEPARTMENT_OTHER): Admitting: Oncology

## 2024-03-01 ENCOUNTER — Inpatient Hospital Stay

## 2024-03-01 ENCOUNTER — Encounter: Payer: Self-pay | Admitting: Oncology

## 2024-03-01 VITALS — BP 110/84 | HR 103

## 2024-03-01 VITALS — BP 109/90 | HR 113 | Temp 97.9°F | Resp 12 | Ht 66.0 in | Wt 136.4 lb

## 2024-03-01 DIAGNOSIS — C3491 Malignant neoplasm of unspecified part of right bronchus or lung: Secondary | ICD-10-CM | POA: Diagnosis not present

## 2024-03-01 DIAGNOSIS — Z5111 Encounter for antineoplastic chemotherapy: Secondary | ICD-10-CM | POA: Diagnosis not present

## 2024-03-01 DIAGNOSIS — C342 Malignant neoplasm of middle lobe, bronchus or lung: Secondary | ICD-10-CM | POA: Diagnosis not present

## 2024-03-01 LAB — CBC WITH DIFFERENTIAL (CANCER CENTER ONLY)
Abs Immature Granulocytes: 0.16 10*3/uL — ABNORMAL HIGH (ref 0.00–0.07)
Basophils Absolute: 0 10*3/uL (ref 0.0–0.1)
Basophils Relative: 1 %
Eosinophils Absolute: 0 10*3/uL (ref 0.0–0.5)
Eosinophils Relative: 0 %
HCT: 35.1 % — ABNORMAL LOW (ref 39.0–52.0)
Hemoglobin: 11.2 g/dL — ABNORMAL LOW (ref 13.0–17.0)
Immature Granulocytes: 4 %
Lymphocytes Relative: 24 %
Lymphs Abs: 1 10*3/uL (ref 0.7–4.0)
MCH: 27.3 pg (ref 26.0–34.0)
MCHC: 31.9 g/dL (ref 30.0–36.0)
MCV: 85.6 fL (ref 80.0–100.0)
Monocytes Absolute: 1.1 10*3/uL — ABNORMAL HIGH (ref 0.1–1.0)
Monocytes Relative: 25 %
Neutro Abs: 2.1 10*3/uL (ref 1.7–7.7)
Neutrophils Relative %: 46 %
Platelet Count: 169 10*3/uL (ref 150–400)
RBC: 4.1 MIL/uL — ABNORMAL LOW (ref 4.22–5.81)
RDW: 24.7 % — ABNORMAL HIGH (ref 11.5–15.5)
WBC Count: 4.4 10*3/uL (ref 4.0–10.5)
nRBC: 0 % (ref 0.0–0.2)

## 2024-03-01 LAB — CMP (CANCER CENTER ONLY)
ALT: 81 U/L — ABNORMAL HIGH (ref 0–44)
AST: 78 U/L — ABNORMAL HIGH (ref 15–41)
Albumin: 2.9 g/dL — ABNORMAL LOW (ref 3.5–5.0)
Alkaline Phosphatase: 89 U/L (ref 38–126)
Anion gap: 6 (ref 5–15)
BUN: 12 mg/dL (ref 6–20)
CO2: 24 mmol/L (ref 22–32)
Calcium: 8.8 mg/dL — ABNORMAL LOW (ref 8.9–10.3)
Chloride: 103 mmol/L (ref 98–111)
Creatinine: 0.62 mg/dL (ref 0.61–1.24)
GFR, Estimated: >60 mL/min (ref >60)
Glucose, Bld: 106 mg/dL — ABNORMAL HIGH (ref 70–99)
Potassium: 4.1 mmol/L (ref 3.5–5.1)
Sodium: 133 mmol/L — ABNORMAL LOW (ref 135–145)
Total Bilirubin: 1 mg/dL (ref 0.0–1.2)
Total Protein: 6.3 g/dL — ABNORMAL LOW (ref 6.5–8.1)

## 2024-03-01 LAB — TSH: TSH: 1.795 u[IU]/mL (ref 0.350–4.500)

## 2024-03-01 MED ORDER — HEPARIN SOD (PORK) LOCK FLUSH 100 UNIT/ML IV SOLN
500.0000 [IU] | Freq: Once | INTRAVENOUS | Status: AC | PRN
Start: 2024-03-01 — End: 2024-03-01
  Administered 2024-03-01: 500 [IU]
  Filled 2024-03-01: qty 5

## 2024-03-01 MED ORDER — SODIUM CHLORIDE 0.9 % IV SOLN
INTRAVENOUS | Status: DC
Start: 2024-03-01 — End: 2024-03-01
  Filled 2024-03-01: qty 250

## 2024-03-01 MED ORDER — DURVALUMAB 500 MG/10ML IV SOLN
1500.0000 mg | Freq: Once | INTRAVENOUS | Status: AC
  Administered 2024-03-01: 1500 mg via INTRAVENOUS
  Filled 2024-03-01: qty 30

## 2024-03-01 NOTE — Progress Notes (Signed)
 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  INTERVAL HISTORY: Patient returns to clinic today for further evaluation and initiation of maintenance durvalumab.  He recently finished his concurrent chemotherapy with weekly carboplatin  and Taxol  as well as daily XRT and tolerated treatment well.  He currently feels well and is asymptomatic.  He has no neurologic complaints.  He denies any recent fevers or illnesses.  He has a good appetite and denies weight loss.  He has no chest pain, shortness of breath, cough, or hemoptysis.  He denies any nausea, vomiting, constipation, or diarrhea.  He has no urinary complaints.  Patient offers no specific complaints today.  REVIEW OF SYSTEMS:   Review of Systems  Constitutional: Negative.  Negative for fever, malaise/fatigue and weight loss.  Respiratory: Negative.  Negative for cough, hemoptysis and shortness of breath.   Cardiovascular: Negative.  Negative for chest pain and leg swelling.  Gastrointestinal: Negative.  Negative for abdominal pain.  Genitourinary: Negative.  Negative for dysuria.  Musculoskeletal: Negative.  Negative for back pain.  Skin: Negative.  Negative for rash.  Neurological: Negative.  Negative for dizziness, focal weakness, weakness and headaches.  Psychiatric/Behavioral: Negative.  The patient is not nervous/anxious.     As per HPI. Otherwise, a complete review of systems is negative.  PAST MEDICAL HISTORY: Past Medical History:  Diagnosis Date   Alcohol abuse    Alcohol abuse, daily use 05/04/2023   Hepatitis C    Hyperlipidemia    Hypertension    Pneumonia    x  several    PAST SURGICAL HISTORY: Past Surgical History:  Procedure Laterality Date   BRONCHIAL BIOPSY  11/17/2023   Procedure: BRONCHIAL BIOPSIES;  Surgeon: Annitta Kindler, MD;  Location: [facility] ENDOSCOPY;  Service: Pulmonary;;   BRONCHIAL BRUSHINGS  11/17/2023   Procedure: BRONCHIAL BRUSHINGS;  Surgeon: Annitta Kindler, MD;  Location: [facility] ENDOSCOPY;  Service: Pulmonary;;   BRONCHIAL NEEDLE ASPIRATION BIOPSY  11/17/2023   Procedure: BRONCHIAL NEEDLE ASPIRATION BIOPSIES;  Surgeon: Annitta Kindler, MD;  Location: [facility] ENDOSCOPY;  Service: Pulmonary;;   BRONCHIAL WASHINGS  11/17/2023   Procedure: BRONCHIAL WASHINGS;  Surgeon: Annitta Kindler, MD;  Location: [facility] ENDOSCOPY;  Service: Pulmonary;;   COLONOSCOPY WITH PROPOFOL  N/A 06/28/2023   Procedure: COLONOSCOPY WITH PROPOFOL ;  Surgeon: Luke Salaam, MD;  Location: [facility] ENDOSCOPY;  Service: Gastroenterology;  Laterality: N/A;   ENDOBRONCHIAL ULTRASOUND Bilateral 11/17/2023   Procedure: ENDOBRONCHIAL ULTRASOUND;  Surgeon: Annitta Kindler, MD;  Location[facility] ENDOSCOPY;  Service: Pulmonary;  Laterality: Bilateral;   FINE NEEDLE ASPIRATION  11/17/2023   Procedure: FINE NEEDLE ASPIRATION (FNA) LINEAR;  Surgeon: Annitta Kindler, MD;  Location: [facility] ENDOSCOPY;  Service: Pulmonary;;   HEMOSTASIS CONTROL  11/17/2023   Procedure: HEMOSTASIS CONTROL;  Surgeon: Annitta Kindler, MD;  Location: [facility] ENDOSCOPY;  Service: Pulmonary;;   IR IMAGING GUIDED PORT INSERTION  12/13/2023   POLYPECTOMY  06/28/2023   Procedure: POLYPECTOMY;  Surgeon: Luke Salaam, MD;  Location: [facility] ENDOSCOPY;  Service: Gastroenterology;;   VIDEO BRONCHOSCOPY WITH RADIAL ENDOBRONCHIAL ULTRASOUND  11/17/2023   Procedure: VIDEO BRONCHOSCOPY WITH RADIAL ENDOBRONCHIAL ULTRASOUND;  Surgeon: Annitta Kindler, MD;  Location: [facility] ENDOSCOPY;  Service: Pulmonary;;    FAMILY HISTORY: Family History  Problem  Relation Age of Onset   Hypertension Father    Heart disease Father    Heart  attack Father    Heart attack Sister    Hypertension Brother    Healthy Brother    Sickle cell anemia Sister    Hypertension Mother    Hypertension Maternal Grandmother    Hypertension Maternal Grandfather    Hypertension Paternal Grandmother    Hypertension Paternal Actor     ADVANCED DIRECTIVES (Y/N):  N  HEALTH MAINTENANCE: Social History   Tobacco Use   Smoking status: Every Day    Current packs/day: 0.50    Average packs/day: 0.5 packs/day for 25.0 years (12.5 ttl pk-yrs)    Types: Cigarettes   Smokeless tobacco: Never   Tobacco comments:    Started smoking at 58 years old.    Smoked 1 PPD at his heaviest.    Now smokes 3 cigarettes. Khj 11/09/2023  Vaping Use   Vaping status: Never Used  Substance Use Topics   Alcohol use: Not Currently    Comment: stopped drinking 11/2017   Drug use: No     Colonoscopy:  PAP:  Bone density:  Lipid panel:  No Known Allergies  Current Outpatient Medications  Medication Sig Dispense Refill   amLODipine  (NORVASC ) 5 MG tablet TAKE 1 TABLET (5 MG TOTAL) BY MOUTH DAILY. 90 tablet 1   lidocaine -prilocaine  (EMLA ) cream Apply to affected area once 30 g 3   ondansetron  (ZOFRAN ) 8 MG tablet Take 1 tablet (8 mg total) by mouth every 8 (eight) hours as needed for nausea or vomiting. Start on the third day after chemotherapy. 30 tablet 1   pantoprazole  (PROTONIX ) 40 MG tablet TAKE 1 TABLET BY MOUTH EVERY DAY 90 tablet 0   prochlorperazine  (COMPAZINE ) 10 MG tablet Take 1 tablet (10 mg total) by mouth every 6 (six) hours as needed for nausea or vomiting. 30 tablet 1   rosuvastatin  (CRESTOR ) 5 MG tablet TAKE 1 TABLET (5 MG TOTAL) BY MOUTH DAILY. (Patient taking differently: Take 5 mg by mouth at bedtime.) 90 tablet 1   gabapentin  (NEURONTIN ) 100 MG capsule Take 2 capsules (200 mg total) by mouth 3 (three) times daily for 20 days. 120 capsule 0   lactose free nutrition (BOOST PLUS) LIQD Take 237 mLs by mouth 3 (three) times daily with  meals. (Patient not taking: Reported on 03/01/2024) 21330 mL 0   varenicline  (CHANTIX ) 0.5 MG tablet Days 1 to 3- 0.5 mg once daily, Days 4 to 7-0.5 mg twice daily, Maintenance day 8 and later 1 mg twice daily. If side effects, reduce dose to previously tolerated dose. (Patient not taking: Reported on 03/01/2024) 60 tablet 0   No current facility-administered medications for this visit.   Facility-Administered Medications Ordered in Other Visits  Medication Dose Route Frequency Provider Last Rate Last Admin   0.9 %  sodium chloride  infusion   Intravenous Continuous Agrawal, Kavita, MD   Stopped at 03/01/24 1248    OBJECTIVE: Vitals:   03/01/24 1019  BP: (!) 109/90  Pulse: (!) 113  Resp: 12  Temp: 97.9 F (36.6 C)  SpO2: 99%     Body mass index is 22.02 kg/m.    ECOG FS:0 - Asymptomatic  General: Well-developed, well-nourished, no acute distress. Eyes: Pink conjunctiva, anicteric sclera. HEENT: Normocephalic, moist mucous membranes. Lungs: No audible wheezing or coughing. Heart: Regular rate and rhythm. Abdomen: Soft, nontender, no obvious distention. Musculoskeletal: No edema, cyanosis, or clubbing. Neuro: Alert, answering all questions appropriately. Cranial nerves  grossly intact. Skin: No rashes or petechiae noted. Psych: Normal affect. Lymphatics: No cervical, calvicular, axillary or inguinal LAD.   LAB RESULTS:  Lab Results  Component Value Date   NA 133 (L) 03/01/2024   K 4.1 03/01/2024   CL 103 03/01/2024   CO2 24 03/01/2024   GLUCOSE 106 (H) 03/01/2024   BUN 12 03/01/2024   CREATININE 0.62 03/01/2024   CALCIUM  8.8 (L) 03/01/2024   PROT 6.3 (L) 03/01/2024   ALBUMIN 2.9 (L) 03/01/2024   AST 78 (H) 03/01/2024   ALT 81 (H) 03/01/2024   ALKPHOS 89 03/01/2024   BILITOT 1.0 03/01/2024   GFRNONAA >60 03/01/2024   GFRAA 125 03/24/2018    Lab Results  Component Value Date   WBC 4.4 03/01/2024   NEUTROABS 2.1 03/01/2024   HGB 11.2 (L) 03/01/2024   HCT 35.1 (L)  03/01/2024   MCV 85.6 03/01/2024   PLT 169 03/01/2024     STUDIES: No results found.  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 [employer] ration sequencing reviewed 5% PD-L1 as well as a high tumor mutational burden.  No other actionable mutations were reported.  PET scan results November 09, 2023 confirm stage of disease.  MRI of the brain on December 09, 2023 did not reveal metastatic disease.  Patient completed concurrent chemotherapy with weekly carboplatinum and Taxol  and daily XRT on Feb 16, 2024.  Patient initiated his year-long maintenance durvalumab on Mar 01, 2024.  Return to clinic in 4 weeks for further evaluation and continuation of treatment.  Will repeat imaging in approximately August 2025. Anemia: Mild, monitor.  Patient's most recent hemoglobin is 11.2. Hepatitis C: Patient has a mild transaminitis.  Continue follow-up with GI as recommended.  I spent a total of 30 minutes reviewing chart data, face-to-face evaluation with the patient, counseling and coordination of care as detailed above.  Patient expressed understanding and was in agreement with this plan. He also understands that He can call clinic at any time with any questions, concerns, or complaints.    Cancer Staging  Lung cancer [facility]) Staging form: Lung, AJCC V9 - Clinical: Stage IIIA (cT4, cN1, cM0) - Signed by Agrawal, Kavita, MD on 11/24/2023   Shellie Dials, MD   03/01/2024 3:47 PM

## 2024-03-01 NOTE — Patient Instructions (Signed)
 [facility] - A DEPT OF [facility]  Discharge Instructions: Thank you for choosing [facility] to provide your oncology and hematology care.  If you have a lab appointment with the Cancer Center, please go directly to the Cancer Center and check in at the registration area.  Wear comfortable clothing and clothing appropriate for easy access to any Portacath or PICC line.   We strive to give you quality time with your provider. You may need to reschedule your appointment if you arrive late (15 or more minutes).  Arriving late affects you and other patients whose appointments are after yours.  Also, if you miss three or more appointments without notifying the office, you may be dismissed from the clinic at the provider's discretion.      For prescription refill requests, have your pharmacy contact our office and allow 72 hours for refills to be completed.    Today you received the following chemotherapy and/or immunotherapy agents Imfinzi      To help prevent nausea and vomiting after your treatment, we encourage you to take your nausea medication as directed.  BELOW ARE SYMPTOMS THAT SHOULD BE REPORTED IMMEDIATELY: *FEVER GREATER THAN 100.4 F (38 C) OR HIGHER *CHILLS OR SWEATING *NAUSEA AND VOMITING THAT IS NOT CONTROLLED WITH YOUR NAUSEA MEDICATION *UNUSUAL SHORTNESS OF BREATH *UNUSUAL BRUISING OR BLEEDING *URINARY PROBLEMS (pain or burning when urinating, or frequent urination) *BOWEL PROBLEMS (unusual diarrhea, constipation, pain near the anus) TENDERNESS IN MOUTH AND THROAT WITH OR WITHOUT PRESENCE OF ULCERS (sore throat, sores in mouth, or a toothache) UNUSUAL RASH, SWELLING OR PAIN  UNUSUAL VAGINAL DISCHARGE OR ITCHING   Items with * indicate a potential emergency and should be followed up as soon as possible or go to the Emergency Department if any problems should occur.  Please show the CHEMOTHERAPY ALERT CARD or IMMUNOTHERAPY  ALERT CARD at check-in to the Emergency Department and triage nurse.  Should you have questions after your visit or need to cancel or reschedule your appointment, please contact [facility] - A DEPT OF [facility]  [phone] and follow the prompts.  Office hours are 8:00 a.m. to 4:30 p.m. Monday - Friday. Please note that voicemails left after 4:00 p.m. may not be returned until the following business day.  We are closed weekends and major holidays. You have access to a nurse at all times for urgent questions. Please call the main number to the clinic [phone] and follow the prompts.  For any non-urgent questions, you may also contact your provider using MyChart. We now offer e-Visits for anyone 15 and older to request care online for non-urgent symptoms. For details visit [email or web address].   Also download the MyChart app! Go to the app store, search "MyChart", open the app, select [facility], and log in with your MyChart username and password.     Durvalumab Injection What is this medication? DURVALUMAB (dur VAL ue mab) treats some types of cancer. It works by helping your immune system slow or stop the spread of cancer cells. It is a monoclonal antibody. This medicine may be used for other purposes; ask your health care provider or pharmacist if you have questions. COMMON BRAND NAME(S): IMFINZI What should I tell my care team before I take this medication? They need to know if you have any of these conditions: Allogeneic stem cell transplant (uses someone else's stem cells) Autoimmune diseases, such as  Crohn disease, ulcerative colitis, lupus History of chest radiation Nervous system problems, such as Guillain-Barre syndrome, myasthenia gravis Organ transplant An unusual or allergic reaction to durvalumab, other medications, foods, dyes, or preservatives Pregnant or trying to get pregnant Breast-feeding How should I use this  medication? This medication is infused into a vein. It is given by your care team in a hospital or clinic setting. A special MedGuide will be given to you before each treatment. Be sure to read this information carefully each time. Talk to your care team about the use of this medication in children. Special care may be needed. Overdosage: If you think you have taken too much of this medicine contact a poison control center or emergency room at once. NOTE: This medicine is only for you. Do not share this medicine with others. What if I miss a dose? Keep appointments for follow-up doses. It is important not to miss your dose. Call your care team if you are unable to keep an appointment. What may interact with this medication? Interactions have not been studied. This list may not describe all possible interactions. Give your health care provider a list of all the medicines, herbs, non-prescription drugs, or dietary supplements you use. Also tell them if you smoke, drink alcohol, or use illegal drugs. Some items may interact with your medicine. What should I watch for while using this medication? Your condition will be monitored carefully while you are receiving this medication. You may need blood work while taking this medication. This medication may cause serious skin reactions. They can happen weeks to months after starting the medication. Contact your care team right away if you notice fevers or flu-like symptoms with a rash. The rash may be red or purple and then turn into blisters or peeling of the skin. You may also notice a red rash with swelling of the face, lips, or lymph nodes in your neck or under your arms. Tell your care team right away if you have any change in your eyesight. Talk to your care team if you may be pregnant. Serious birth defects can occur if you take this medication during pregnancy and for 3 months after the last dose. You will need a negative pregnancy test before starting  this medication. Contraception is recommended while taking this medication and for 3 months after the last dose. Your care team can help you find the option that works for you. Do not breastfeed while taking this medication and for 3 months after the last dose. What side effects may I notice from receiving this medication? Side effects that you should report to your care team as soon as possible: Allergic reactions--skin rash, itching, hives, swelling of the face, lips, tongue, or throat Dry cough, shortness of breath or trouble breathing Eye pain, redness, irritation, or discharge with blurry or decreased vision Heart muscle inflammation--unusual weakness or fatigue, shortness of breath, chest pain, fast or irregular heartbeat, dizziness, swelling of the ankles, feet, or hands Hormone gland problems--headache, sensitivity to light, unusual weakness or fatigue, dizziness, fast or irregular heartbeat, increased sensitivity to cold or heat, excessive sweating, constipation, hair loss, increased thirst or amount of urine, tremors or shaking, irritability Infusion reactions--chest pain, shortness of breath or trouble breathing, feeling faint or lightheaded Kidney injury (glomerulonephritis)--decrease in the amount of urine, red or dark brown urine, foamy or bubbly urine, swelling of the ankles, hands, or feet Liver injury--right upper belly pain, loss of appetite, nausea, light-colored stool, dark yellow or brown urine, yellowing  skin or eyes, unusual weakness or fatigue Pain, tingling, or numbness in the hands or feet, muscle weakness, change in vision, confusion or trouble speaking, loss of balance or coordination, trouble walking, seizures Rash, fever, and swollen lymph nodes Redness, blistering, peeling, or loosening of the skin, including inside the mouth Sudden or severe stomach pain, bloody diarrhea, fever, nausea, vomiting Side effects that usually do not require medical attention (report these  to your care team if they continue or are bothersome): Bone, joint, or muscle pain Diarrhea Fatigue Loss of appetite Nausea Skin rash This list may not describe all possible side effects. Call your doctor for medical advice about side effects. You may report side effects to FDA at 1-800-FDA-1088. Where should I keep my medication? This medication is given in a hospital or clinic. It will not be stored at home. NOTE: This sheet is a summary. It may not cover all possible information. If you have questions about this medicine, talk to your doctor, pharmacist, or health care provider.  2024 Elsevier/Gold Standard (2022-02-09 00:00:00)

## 2024-03-01 NOTE — Progress Notes (Signed)
 Patient on 4 mg po decadron  taken post chemo. Patient no longer on chemo so Reached out to Dr Adrian Alba who dc'd the decadron  oral medications

## 2024-03-01 NOTE — Progress Notes (Signed)
 Pt would like to know the name of the treatment and effects.

## 2024-03-02 ENCOUNTER — Other Ambulatory Visit: Payer: Self-pay

## 2024-03-02 ENCOUNTER — Encounter: Payer: Self-pay | Admitting: Internal Medicine

## 2024-03-02 LAB — T4: T4, Total: 15.2 ug/dL — ABNORMAL HIGH (ref 4.5–12.0)

## 2024-03-06 ENCOUNTER — Other Ambulatory Visit: Payer: Self-pay

## 2024-03-19 ENCOUNTER — Encounter: Payer: Self-pay | Admitting: Radiation Oncology

## 2024-03-19 ENCOUNTER — Ambulatory Visit
Admission: RE | Admit: 2024-03-19 | Discharge: 2024-03-19 | Disposition: A | Discharge status: Home / Self Care | Source: Ambulatory Visit | Attending: Radiation Oncology | Admitting: Radiation Oncology

## 2024-03-19 VITALS — BP 104/84 | HR 111 | Temp 98.0°F | Resp 16 | Ht 66.0 in | Wt 139.0 lb

## 2024-03-19 DIAGNOSIS — C342 Malignant neoplasm of middle lobe, bronchus or lung: Secondary | ICD-10-CM | POA: Insufficient documentation

## 2024-03-19 NOTE — Progress Notes (Signed)
 Radiation Oncology Follow up Note  Name: Nicholas Mccoy   Date:   03/19/2024 MRN:  [id number] DOB: [birth year]    This 58 y.o. male presents to the clinic today for 1 month follow-up status post concurrent chemoradiation therapy for stage IIIa (cT4 cN1 M0) adenocarcinoma the right lung.  REFERRING PROVIDER: Carlean Charter, DO  HPI: Patient is a 58 year old male now out 1 month having completed concurrent chemoradiation therapy for stage IIIa adenocarcinoma the right lung.  Seen today in routine follow-up he is doing well he says his appetite is good his breathing pattern is stable he specifically denies any increased shortness of breath cough hemoptysis or chest tightness.  He has been started on.  Durvalumab  which he is tolerating well  COMPLICATIONS OF TREATMENT: none  FOLLOW UP COMPLIANCE: keeps appointments   PHYSICAL EXAM:  BP 104/84   Pulse (!) 111   Temp 98 F (36.7 C) (Tympanic)   Resp 16   Ht 5\' 6"  (1.676 m)   Wt 139 lb (63 kg)   BMI 22.44 kg/m  Well-developed well-nourished patient in NAD. HEENT reveals PERLA, EOMI, discs not visualized.  Oral cavity is clear. No oral mucosal lesions are identified. Neck is clear without evidence of cervical or supraclavicular adenopathy. Lungs are clear to A&P. Cardiac examination is essentially unremarkable with regular rate and rhythm without murmur rub or thrill. Abdomen is benign with no organomegaly or masses noted. Motor sensory and DTR levels are equal and symmetric in the upper and lower extremities. Cranial nerves II through XII are grossly intact. Proprioception is intact. No peripheral adenopathy or edema is identified. No motor or sensory levels are noted. Crude visual fields are within normal range.  RADIOLOGY RESULTS: No current films for review  PLAN: Present time patient is doing well 1 month out from concurrent chemoradiation therapy.  He continues on Durvalumab  under medical oncology's direction which he is tolerating  well.  Of asked to see him back in 3 to 4 months and we will obtain a CT scan at that time but has not already been performed.  Patient knows to contact me with any concerns.  I would like to take this opportunity to thank you for allowing me to participate in the care of your patient.Glenis Langdon, MD

## 2024-03-20 ENCOUNTER — Ambulatory Visit: Admitting: Family Medicine

## 2024-03-20 ENCOUNTER — Encounter: Payer: Self-pay | Admitting: Family Medicine

## 2024-03-20 VITALS — BP 122/70 | HR 88 | Resp 16 | Ht 66.0 in | Wt 137.0 lb

## 2024-03-20 DIAGNOSIS — I10 Essential (primary) hypertension: Secondary | ICD-10-CM

## 2024-03-20 DIAGNOSIS — C3491 Malignant neoplasm of unspecified part of right bronchus or lung: Secondary | ICD-10-CM

## 2024-03-20 DIAGNOSIS — B182 Chronic viral hepatitis C: Secondary | ICD-10-CM

## 2024-03-20 DIAGNOSIS — C3411 Malignant neoplasm of upper lobe, right bronchus or lung: Secondary | ICD-10-CM | POA: Insufficient documentation

## 2024-03-20 DIAGNOSIS — E785 Hyperlipidemia, unspecified: Secondary | ICD-10-CM

## 2024-03-20 DIAGNOSIS — R222 Localized swelling, mass and lump, trunk: Secondary | ICD-10-CM

## 2024-03-20 DIAGNOSIS — Z0000 Encounter for general adult medical examination without abnormal findings: Secondary | ICD-10-CM

## 2024-03-20 DIAGNOSIS — K219 Gastro-esophageal reflux disease without esophagitis: Secondary | ICD-10-CM

## 2024-03-20 LAB — LIPID PANEL
Cholesterol: 157 mg/dL (ref <200)
HDL: 44 mg/dL (ref >=40)
LDL Cholesterol (Calc): 96 mg/dL
Non-HDL Cholesterol (Calc): 113 mg/dL (ref <130)
Total CHOL/HDL Ratio: 3.6 (calc) (ref <5.0)
Triglycerides: 80 mg/dL (ref <150)

## 2024-03-20 MED ORDER — PANTOPRAZOLE SODIUM 40 MG PO TBEC: 40.0000 mg | DELAYED_RELEASE_TABLET | Freq: Every day | ORAL | 1 refills | Status: DC

## 2024-03-20 MED ORDER — AMLODIPINE BESYLATE 5 MG PO TABS: 5.0000 mg | ORAL_TABLET | Freq: Every day | ORAL | 1 refills | Status: DC

## 2024-03-20 MED ORDER — ROSUVASTATIN CALCIUM 5 MG PO TABS: 5.0000 mg | ORAL_TABLET | Freq: Every day | ORAL | 1 refills | Status: DC

## 2024-03-20 NOTE — Progress Notes (Signed)
 Name: Nicholas Mccoy   MRN: [id number]    DOB: November 30, 1965   Date:03/20/2024       Progress Note  Chief Complaint  Patient presents with   Establish Care     Subjective:   Nicholas Mccoy is a 58 y.o. male, presents to clinic to est care, he was at PCP upstairs and is changing practices  HTN on amlodipine  BP Readings from Last 3 Encounters:  03/20/24 122/70  03/19/24 104/84  03/01/24 110/84   GERD on protonix  once daily well controlled (new med from oncology)  HLD -  Lab Results  Component Value Date   CHOL 182 04/01/2023   HDL 55 04/01/2023   LDLCALC 112 (H) 04/01/2023   TRIG 79 04/01/2023   CHOLHDL 3.3 04/01/2023     Last labs through oncology just a few weeks ago- reviewed today  Reviewed his ED visit, hospitalizations, last hem/onc and rad onc notes and labs He completed initial chemo and radiation, going to be doing some kind of maintenance chemo infusion  Hx of chronic hep C never tx  He has a lump on his chest that he wants checked out - oncology said it is not cancerous and to f/up with PCP   No results found for: HGBA1C       Current Outpatient Medications:    amLODipine  (NORVASC ) 5 MG tablet, TAKE 1 TABLET (5 MG TOTAL) BY MOUTH DAILY., Disp: 90 tablet, Rfl: 1   pantoprazole  (PROTONIX ) 40 MG tablet, TAKE 1 TABLET BY MOUTH EVERY DAY, Disp: 90 tablet, Rfl: 0   gabapentin  (NEURONTIN ) 100 MG capsule, Take 2 capsules (200 mg total) by mouth 3 (three) times daily for 20 days., Disp: 120 capsule, Rfl: 0  Patient Active Problem List   Diagnosis Date Noted   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)    Encounter for antineoplastic chemotherapy 01/12/2024   Lung cancer (HCC) 11/24/2023   Chronic hepatitis C without hepatic coma (HCC) 07/29/2023   Rectal bleeding 06/28/2023   Adenomatous polyp of colon 06/28/2023   Hypercalcemia 05/04/2023   Cramping of hands 05/04/2023   Cramping of feet 05/04/2023   Elevated liver enzymes 05/04/2023    Dyslipidemia, goal LDL below 70 04/06/2023   Essential hypertension 01/01/2019   Biceps tendinitis 03/24/2018   Impingement syndrome of shoulder region 03/24/2018   Current tobacco use 09/15/2015    Past Surgical History:  Procedure Laterality Date   BRONCHIAL BIOPSY  11/17/2023   Procedure: BRONCHIAL BIOPSIES;  Surgeon: Annitta Kindler, MD;  Location: [facility] ENDOS[facility];  Service: Pulmonary;;   BRONCHIAL BRUSHINGS  11/17/2023   Procedure: BRONCHIAL BRUSHINGS;  Surgeon: Annitta Kindler, MD;  Location: [facility];  Service: Pulmonary;;   BRONCHIAL NEEDLE ASPIRATION BIOPSY  11/17/2023   Procedure: BRONCHIAL NEEDLE ASPIRATION BIOPSIES;  Surgeon: Annitta Kindler, MD;  Location: [facility];  Service: Pulmonary;;   BRONCHIAL WASHINGS  11/17/2023   Procedure: BRONCHIAL WASHINGS;  Surgeon: Annitta Kindler, MD;  Location: [facility];  Service: Pulmonary;;   COLONOSCOPY WITH PROPOFOL  N/A 06/28/2023   Procedure: COLONOSCOPY WITH PROPOFOL ;  Surgeon: Luke Salaam, MD;  Location: [facility] ENDOSCOPY;  Service: Gastroenterology;  Laterality: N/A;   ENDOBRONCHIAL ULTRASOUND Bilateral 11/17/2023   Procedure: ENDOBRONCHIAL ULTRASOUND;  Surgeon: Annitta Kindler, MD;  Location: [facility] ENDOSCOPY;  Service: Pulmonary;  Laterality: Bilateral;   FINE NEEDLE ASPIRATION  11/17/2023   Procedure: FINE NEEDLE ASPIRATION (FNA) LINEAR;  Surgeon: Annitta Kindler, MD;  Location: [facility]OS[facility];  Service: Pulmonary;;   HEMOSTASIS CONTROL  11/17/2023  Procedure: HEMOSTASIS CONTROL;  Surgeon: Annitta Kindler, MD;  Location: [facility] ENDOSCOPY;  Service: Pulmonary;;   IR IMAGING GUIDED PORT INSERTION  12/13/2023   POLYPECTOMY  06/28/2023   Procedure: POLYPECTOMY;  Surgeon: Luke Salaam, MD;  Location: [facility] ENDOSCOPY;  Service: Gastroenterology;;   VIDEO BRONCHOSCOPY WITH RADIAL ENDOBRONCHIAL ULTRASOUND  11/17/2023   Procedure: VIDEO BRONCHOSCOPY WITH RADIAL ENDOBRONCHIAL ULTRASOUND;  Surgeon: Annitta Kindler, MD;   Location: [facility]OS[facility];  Service: Pulmonary;;    Family History  Problem Relation Age of Onset   Hypertension Father    Heart disease Father    Heart attack Father    Heart attack Sister    Hypertension Brother    Healthy Brother    Sickle cell anemia Sister    Hypertension Mother    COPD Mother    Hypertension Maternal Grandmother    Hypertension Maternal Grandfather    Hypertension Paternal Grandmother    Hypertension Paternal Grandfather     Social History   Tobacco Use   Smoking status: Former    Current packs/day: 0.50    Average packs/day: 0.5 packs/day for 25.0 years (12.5 ttl pk-yrs)    Types: Cigarettes   Smokeless tobacco: Never   Tobacco comments:    Started smoking at 57 years old.    Smoked 1 PPD at his heaviest.    Now smokes 3 cigarettes. Khj 11/09/2023  Vaping Use   Vaping status: Never Used  Substance Use Topics   Alcohol use: Not Currently    Comment: stopped drinking 11/2017   Drug use: No     No Known Allergies  Health Maintenance  Topic Date Due   Pneumococcal Vaccine 21-13 Years old (1 of 2 - PCV) Never done   COVID-19 Vaccine (2 - Pfizer risk series) 04/05/2024 (Originally 02/08/2020)   Zoster Vaccines- Shingrix (1 of 2) 06/20/2024 (Originally 02/27/1985)   INFLUENZA VACCINE  05/11/2024   Colonoscopy  06/27/2026   DTaP/Tdap/Td (2 - Td or Tdap) 03/24/2028   Hepatitis C Screening  Completed   HIV Screening  Completed   HPV VACCINES  Aged Out   Meningococcal B Vaccine  Aged Out    Chart Review Today: I personally reviewed active problem list, medication list, allergies, family history, social history, health maintenance, notes from last encounter, lab results, imaging with the patient/caregiver today.   Review of Systems  Constitutional: Negative.   HENT: Negative.    Eyes: Negative.   Respiratory: Negative.    Cardiovascular: Negative.   Gastrointestinal: Negative.   Endocrine: Negative.   Genitourinary: Negative.    Musculoskeletal: Negative.   Skin: Negative.   Allergic/Immunologic: Negative.   Neurological: Negative.   Hematological: Negative.   Psychiatric/Behavioral: Negative.    All other systems reviewed and are negative.    Objective:   Vitals:   03/20/24 1321  BP: 122/70  Pulse: 88  Resp: 16  SpO2: 95%  Weight: 137 lb (62.1 kg)  Height: 5' 6 (1.676 m)    Body mass index is 22.11 kg/m.  Physical Exam Vitals and nursing note reviewed.  Constitutional:      General: He is not in acute distress.    Appearance: Normal appearance. He is well-developed. He is not ill-appearing, toxic-appearing or diaphoretic.  HENT:     Head: Normocephalic and atraumatic.     Right Ear: External ear normal.     Left Ear: External ear normal.     Nose: Nose normal.   Eyes:     General: No scleral icterus.  Right eye: No discharge.        Left eye: No discharge.     Conjunctiva/sclera: Conjunctivae normal.   Neck:     Trachea: No tracheal deviation.   Cardiovascular:     Rate and Rhythm: Normal rate and regular rhythm.     Pulses: Normal pulses.     Heart sounds: Normal heart sounds.  Pulmonary:     Effort: Pulmonary effort is normal. No respiratory distress.     Breath sounds: No stridor. Rales present.   Skin:    General: Skin is warm and dry.     Coloration: Skin is not jaundiced.     Findings: No rash.     Comments: Nodule/mass - firm nodule superficial chest left upper anterior axilary line roughly 2cm diameter, overlying skin hyperpigmented, no induration, fluctuance, erythema   Neurological:     Mental Status: He is alert.     Motor: No abnormal muscle tone.     Coordination: Coordination normal.     Gait: Gait normal.   Psychiatric:        Mood and Affect: Mood normal.        Behavior: Behavior normal.      Functional Status Survey: Is the patient deaf or have difficulty hearing?: No Does the patient have difficulty seeing, even when wearing  glasses/contacts?: No Does the patient have difficulty concentrating, remembering, or making decisions?: No Does the patient have difficulty walking or climbing stairs?: No Does the patient have difficulty dressing or bathing?: No Does the patient have difficulty doing errands alone such as visiting a doctor's office or shopping?: No Results for orders placed or performed in visit on 03/01/24  TSH   Collection Time: 03/01/24 10:11 AM  Result Value Ref Range   TSH 1.795 0.350 - 4.500 uIU/mL  T4   Collection Time: 03/01/24 10:11 AM  Result Value Ref Range   T4, Total 15.2 (H) 4.5 - 12.0 ug/dL  CMP (Cancer Center only)   Collection Time: 03/01/24 10:11 AM  Result Value Ref Range   Sodium 133 (L) 135 - 145 mmol/L   Potassium 4.1 3.5 - 5.1 mmol/L   Chloride 103 98 - 111 mmol/L   CO2 24 22 - 32 mmol/L   Glucose, Bld 106 (H) 70 - 99 mg/dL   BUN 12 6 - 20 mg/dL   Creatinine 9.52 8.41 - 1.24 mg/dL   Calcium  8.8 (L) 8.9 - 10.3 mg/dL   Total Protein 6.3 (L) 6.5 - 8.1 g/dL   Albumin 2.9 (L) 3.5 - 5.0 g/dL   AST 78 (H) 15 - 41 U/L   ALT 81 (H) 0 - 44 U/L   Alkaline Phosphatase 89 38 - 126 U/L   Total Bilirubin 1.0 0.0 - 1.2 mg/dL   GFR, Estimated >32 >44 mL/min   Anion gap 6 5 - 15  CBC with Differential (Cancer Center Only)   Collection Time: 03/01/24 10:11 AM  Result Value Ref Range   WBC Count 4.4 4.0 - 10.5 K/uL   RBC 4.10 (L) 4.22 - 5.81 MIL/uL   Hemoglobin 11.2 (L) 13.0 - 17.0 g/dL   HCT 01.0 (L) 27.2 - 53.6 %   MCV 85.6 80.0 - 100.0 fL   MCH 27.3 26.0 - 34.0 pg   MCHC 31.9 30.0 - 36.0 g/dL   RDW 64.4 (H) 03.4 - 74.2 %   Platelet Count 169 150 - 400 K/uL   nRBC 0.0 0.0 - 0.2 %   Neutrophils Relative % 46 %  Neutro Abs 2.1 1.7 - 7.7 K/uL   Lymphocytes Relative 24 %   Lymphs Abs 1.0 0.7 - 4.0 K/uL   Monocytes Relative 25 %   Monocytes Absolute 1.1 (H) 0.1 - 1.0 K/uL   Eosinophils Relative 0 %   Eosinophils Absolute 0.0 0.0 - 0.5 K/uL   Basophils Relative 1 %   Basophils  Absolute 0.0 0.0 - 0.1 K/uL   Immature Granulocytes 4 %   Abs Immature Granulocytes 0.16 (H) 0.00 - 0.07 K/uL      Assessment & Plan:   Encounter for medical examination to establish care -transferring care from other [facility] primary care office to [facility], reviewed chart extensively including multiple PCP OV from the last 1-2 years.  Last PCP OV was oct 2024  Essential hypertension Assessment & Plan: BP at goal today on current medications, continue amlodipine  5 mg dose daily BP Readings from Last 3 Encounters:  03/20/24 122/70  03/19/24 104/84  03/01/24 110/84  Good compliance no SE or concerns, reviewed last BMP/CMP   Orders: -     amLODIPine  Besylate; Take 1 tablet (5 mg total) by mouth daily.  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  Dispense: 90 tablet; Refill: 1  Dyslipidemia, goal LDL below 70 Assessment & Plan: On crestor  5 mg, pt states he needs a refill Will recheck lipids LFTs elevated - will monitor Pt notes he has tolerated statin w/o concerns or SE, as long as he tolerates and renal and liver function tolerates we will keep him on statin  Orders: -     Lipid panel -     Rosuvastatin  Calcium ; Take 1 tablet (5 mg total) by mouth daily.  Dispense: 90 tablet; Refill: 1  Mass of left chest wall -     Ambulatory  referral to General Surgery Firm nodule superficial chest left upper anterior axilary line, noticed it months ago but more noticable with weight loss from cancer tx  Will have general surgery take a look for excision, does not currently look inflamed or infected he notes that there was some prior inflammation right now it looks that there are some hyperpigmentation changes of the overlying skin.  Discussed the general surgery follow-up, he also could consult with dermatology but the referral would take significantly longer to get an initial appointment    Return in about 6 months (around 09/19/2024) for Routine follow-up.   Adeline Hone, PA-C 03/20/24 1:40 PM

## 2024-03-21 ENCOUNTER — Ambulatory Visit: Payer: Self-pay | Admitting: Family Medicine

## 2024-03-21 ENCOUNTER — Other Ambulatory Visit: Payer: Self-pay

## 2024-03-26 DIAGNOSIS — K219 Gastro-esophageal reflux disease without esophagitis: Secondary | ICD-10-CM | POA: Insufficient documentation

## 2024-03-26 NOTE — Assessment & Plan Note (Signed)
 He can continue PPI, wean off or take breaks as able Avoid NSAIDs and ETOH as well as food triggers GERD sx secondary to radiation SE

## 2024-03-26 NOTE — Assessment & Plan Note (Signed)
 He will need to est care with GI, he did one prior consult and no tx Refer to GI to coordinate with hem/onc with his other tx

## 2024-03-26 NOTE — Assessment & Plan Note (Signed)
 BP at goal today on current medications, continue amlodipine  5 mg dose daily BP Readings from Last 3 Encounters:  03/20/24 122/70  03/19/24 104/84  03/01/24 110/84  Good compliance no SE or concerns, reviewed last BMP/CMP

## 2024-03-26 NOTE — Assessment & Plan Note (Signed)
 On crestor  5 mg, pt states he needs a refill Will recheck lipids LFTs elevated - will monitor Pt notes he has tolerated statin w/o concerns or SE, as long as he tolerates and renal and liver function tolerates we will keep him on statin

## 2024-03-29 ENCOUNTER — Ambulatory Visit

## 2024-03-29 ENCOUNTER — Other Ambulatory Visit

## 2024-03-29 ENCOUNTER — Ambulatory Visit: Admitting: Oncology

## 2024-03-29 ENCOUNTER — Ambulatory Visit: Payer: Self-pay | Admitting: Surgery

## 2024-03-30 ENCOUNTER — Encounter: Payer: Self-pay | Admitting: Oncology

## 2024-03-30 ENCOUNTER — Inpatient Hospital Stay: Attending: Oncology

## 2024-03-30 ENCOUNTER — Inpatient Hospital Stay

## 2024-03-30 ENCOUNTER — Inpatient Hospital Stay (HOSPITAL_BASED_OUTPATIENT_CLINIC_OR_DEPARTMENT_OTHER): Admitting: Oncology

## 2024-03-30 VITALS — BP 121/84 | HR 80 | Resp 20

## 2024-03-30 VITALS — BP 105/79 | HR 96 | Temp 97.6°F | Resp 19 | Wt 140.6 lb

## 2024-03-30 DIAGNOSIS — C3491 Malignant neoplasm of unspecified part of right bronchus or lung: Secondary | ICD-10-CM | POA: Diagnosis not present

## 2024-03-30 DIAGNOSIS — Z79899 Other long term (current) drug therapy: Secondary | ICD-10-CM | POA: Diagnosis not present

## 2024-03-30 DIAGNOSIS — Z87891 Personal history of nicotine dependence: Secondary | ICD-10-CM | POA: Diagnosis not present

## 2024-03-30 DIAGNOSIS — I10 Essential (primary) hypertension: Secondary | ICD-10-CM | POA: Insufficient documentation

## 2024-03-30 DIAGNOSIS — C3411 Malignant neoplasm of upper lobe, right bronchus or lung: Secondary | ICD-10-CM | POA: Diagnosis present

## 2024-03-30 DIAGNOSIS — Z832 Family history of diseases of the blood and blood-forming organs and certain disorders involving the immune mechanism: Secondary | ICD-10-CM | POA: Diagnosis not present

## 2024-03-30 DIAGNOSIS — E785 Hyperlipidemia, unspecified: Secondary | ICD-10-CM | POA: Diagnosis not present

## 2024-03-30 DIAGNOSIS — R7401 Elevation of levels of liver transaminase levels: Secondary | ICD-10-CM | POA: Insufficient documentation

## 2024-03-30 DIAGNOSIS — Z825 Family history of asthma and other chronic lower respiratory diseases: Secondary | ICD-10-CM | POA: Insufficient documentation

## 2024-03-30 DIAGNOSIS — Z8249 Family history of ischemic heart disease and other diseases of the circulatory system: Secondary | ICD-10-CM | POA: Insufficient documentation

## 2024-03-30 LAB — CBC WITH DIFFERENTIAL (CANCER CENTER ONLY)
Abs Immature Granulocytes: 0.02 10*3/uL (ref 0.00–0.07)
Basophils Absolute: 0 10*3/uL (ref 0.0–0.1)
Basophils Relative: 1 %
Eosinophils Absolute: 0.2 10*3/uL (ref 0.0–0.5)
Eosinophils Relative: 4 %
HCT: 37.7 % — ABNORMAL LOW (ref 39.0–52.0)
Hemoglobin: 11.9 g/dL — ABNORMAL LOW (ref 13.0–17.0)
Immature Granulocytes: 1 %
Lymphocytes Relative: 29 %
Lymphs Abs: 1.2 10*3/uL (ref 0.7–4.0)
MCH: 28.7 pg (ref 26.0–34.0)
MCHC: 31.6 g/dL (ref 30.0–36.0)
MCV: 90.8 fL (ref 80.0–100.0)
Monocytes Absolute: 0.9 10*3/uL (ref 0.1–1.0)
Monocytes Relative: 23 %
Neutro Abs: 1.8 10*3/uL (ref 1.7–7.7)
Neutrophils Relative %: 42 %
Platelet Count: 171 10*3/uL (ref 150–400)
RBC: 4.15 MIL/uL — ABNORMAL LOW (ref 4.22–5.81)
RDW: 21.1 % — ABNORMAL HIGH (ref 11.5–15.5)
WBC Count: 4.1 10*3/uL (ref 4.0–10.5)
nRBC: 0 % (ref 0.0–0.2)

## 2024-03-30 LAB — CMP (CANCER CENTER ONLY)
ALT: 91 U/L — ABNORMAL HIGH (ref 0–44)
AST: 125 U/L — ABNORMAL HIGH (ref 15–41)
Albumin: 3.3 g/dL — ABNORMAL LOW (ref 3.5–5.0)
Alkaline Phosphatase: 90 U/L (ref 38–126)
Anion gap: 7 (ref 5–15)
BUN: 13 mg/dL (ref 6–20)
CO2: 25 mmol/L (ref 22–32)
Calcium: 9.5 mg/dL (ref 8.9–10.3)
Chloride: 104 mmol/L (ref 98–111)
Creatinine: 0.65 mg/dL (ref 0.61–1.24)
GFR, Estimated: >60 mL/min (ref >60)
Glucose, Bld: 108 mg/dL — ABNORMAL HIGH (ref 70–99)
Potassium: 3.7 mmol/L (ref 3.5–5.1)
Sodium: 136 mmol/L (ref 135–145)
Total Bilirubin: 1 mg/dL (ref 0.0–1.2)
Total Protein: 7.1 g/dL (ref 6.5–8.1)

## 2024-03-30 MED ORDER — SODIUM CHLORIDE 0.9 % IV SOLN
INTRAVENOUS | Status: DC
  Filled 2024-03-30: qty 250

## 2024-03-30 MED ORDER — HEPARIN SOD (PORK) LOCK FLUSH 100 UNIT/ML IV SOLN
500.0000 [IU] | Freq: Once | INTRAVENOUS | Status: AC | PRN
  Administered 2024-03-30: 500 [IU]
  Filled 2024-03-30: qty 5

## 2024-03-30 MED ORDER — SODIUM CHLORIDE 0.9 % IV SOLN
1500.0000 mg | Freq: Once | INTRAVENOUS | Status: AC
  Administered 2024-03-30: 1500 mg via INTRAVENOUS
  Filled 2024-03-30: qty 30

## 2024-03-30 MED ORDER — SODIUM CHLORIDE 0.9% FLUSH
10.0000 mL | INTRAVENOUS | Status: DC | PRN
  Administered 2024-03-30: 10 mL
  Filled 2024-03-30: qty 10

## 2024-03-30 NOTE — Progress Notes (Signed)
 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  INTERVAL HISTORY: Patient returns to clinic today for further evaluation and consideration of cycle 2 of monthly maintenance durvalumab .  He is tolerating his treatments well without significant side effects.  He currently feels well and is asymptomatic.  His only complaint is a cyst on his left chest wall that is not growing or painful.  He has no neurologic complaints.  He denies any recent fevers or illnesses.  He has a good appetite and denies weight loss.  He has no chest pain, shortness of breath, cough, or hemoptysis.  He denies any nausea, vomiting, constipation, or diarrhea.  He has no urinary complaints.  Patient offers no further specific complaints today.  REVIEW OF SYSTEMS:   Review of Systems  Constitutional: Negative.  Negative for fever, malaise/fatigue and weight loss.  Respiratory: Negative.  Negative for cough, hemoptysis and shortness of breath.   Cardiovascular: Negative.  Negative for chest pain and leg swelling.  Gastrointestinal: Negative.  Negative for abdominal pain.  Genitourinary: Negative.  Negative for dysuria.  Musculoskeletal: Negative.  Negative for back pain.  Skin: Negative.  Negative for rash.  Neurological: Negative.  Negative for dizziness, focal weakness, weakness and headaches.  Psychiatric/Behavioral: Negative.  The patient is not nervous/anxious.     As per HPI. Otherwise, a complete review of systems is negative.  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)    Hepatitis C    Hyperlipidemia    Hypertension    Pneumonia    x several    PAST SURGICAL HISTORY: Past Surgical History:  Procedure Laterality Date   BRONCHIAL BIOPSY  11/17/2023   Procedure: BRONCHIAL BIOPSIES;  Surgeon: Annitta Kindler, MD;  Location: [facility] ENDOSCOPY;  Service: Pulmonary;;   BRONCHIAL BRUSHINGS  11/17/2023   Procedure: BRONCHIAL BRUSHINGS;  Surgeon: Annitta Kindler, MD;  Location: [facility] ENDOSCOPY;  Service: Pulmonary;;   BRONCHIAL NEEDLE ASPIRATION BIOPSY  11/17/2023   Procedure: BRONCHIAL NEEDLE ASPIRATION BIOPSIES;  Surgeon: Annitta Kindler, MD;  Location: [facility] ENDOSCOPY;  Service: Pulmonary;;   BRONCHIAL WASHINGS  11/17/2023   Procedure: BRONCHIAL WASHINGS;  Surgeon: Annitta Kindler, MD;  Location: [facility] ENDOS[facility];  Service: Pulmonary;;   COLONOSCOPY WITH PROPOFOL  N/A 06/28/2023   Procedure: COLONOSCOPY WITH PROPOFOL ;  Surgeon: Luke Salaam, MD;  Location: [facility] ENDOSCOPY;  Service: Gastroenterology;  Laterality: N/A;   ENDOBRONCHIAL ULTRASOUND Bilateral 11/17/2023   Procedure: ENDOBRONCHIAL ULTRASOUND;  Surgeon: Annitta Kindler, MD;  Location: [facility] ENDOSCOPY;  Service: Pulmonary;  Laterality: Bilateral;   FINE NEEDLE ASPIRATION  11/17/2023   Procedure: FINE NEEDLE ASPIRATION (FNA) LINEAR;  Surgeon: Annitta Kindler, MD;  Location: [facility] ENDOSCOPY;  Service: Pulmonary;;   HEMOSTASIS CONTROL  11/17/2023   Procedure: HEMOSTASIS CONTROL;  Surgeon: Annitta Kindler, MD;  Location: [facility] ENDOSCOPY;  Service: Pulmonary;;   IR IMAGING GUIDED PORT INSERTION  12/13/2023   POLYPECTOMY  06/28/2023   Procedure: POLYPECTOMY;  Surgeon: Luke Salaam, MD;  Location: [facility] ENDOSCOPY;  Service: Gastroenterology;;   VIDEO BRONCHOSCOPY WITH RADIAL ENDOBRONCHIAL ULTRASOUND  11/17/2023   Procedure: VIDEO BRONCHOSCOPY WITH RADIAL ENDOBRONCHIAL ULTRASOUND;  Surgeon: Annitta Kindler, MD;  Location: [facility]  ENDOSCOPY;  Service: Pulmonary;;    FAMILY HISTORY: Family History  Problem Relation Age of Onset   Hypertension Father    Heart disease Father    Heart attack Father    Heart attack Sister    Hypertension Brother    Healthy Brother    Sickle cell anemia Sister    Hypertension Mother    COPD Mother    Hypertension Maternal Grandmother    Hypertension Maternal Grandfather    Hypertension Paternal Grandmother    Hypertension Paternal Actor     ADVANCED DIRECTIVES (Y/N):  N  HEALTH MAINTENANCE: Social History   Tobacco Use   Smoking status: Former    Current packs/day: 0.50    Average packs/day: 0.5 packs/day for 25.0 years (12.5 ttl pk-yrs)    Types: Cigarettes   Smokeless tobacco: Never   Tobacco comments:    Started smoking at 58 years old.    Smoked 1 PPD at his heaviest.    Now smokes 3 cigarettes. Khj 11/09/2023  Vaping Use   Vaping status: Never Used  Substance Use Topics   Alcohol use: Not Currently    Comment: stopped drinking 11/2017   Drug use: No     Colonoscopy:  PAP:  Bone density:  Lipid panel:  No Known Allergies  Current Outpatient Medications  Medication Sig Dispense Refill   amLODipine  (NORVASC ) 5 MG tablet Take 1 tablet (5 mg total) by mouth daily. 90 tablet 1   gabapentin  (NEURONTIN ) 100 MG capsule Take 2 capsules (200 mg total) by mouth 3 (three) times daily for 20 days. 120 capsule 0   pantoprazole  (PROTONIX ) 40 MG tablet Take 1 tablet (40 mg total) by mouth daily. 90 tablet 1   rosuvastatin  (CRESTOR ) 5 MG tablet Take 1 tablet (5 mg total) by mouth daily. 90 tablet 1   No current facility-administered medications for this visit.    OBJECTIVE: Vitals:   03/30/24 1024  BP: 105/79  Pulse: 96  Resp: 19  Temp: 97.6 F (36.4 C)  SpO2: 99%     Body mass index is 22.69 kg/m.    ECOG FS:0 - Asymptomatic  General: Well-developed, well-nourished, no acute distress. Eyes: Pink conjunctiva, anicteric sclera. HEENT: Normocephalic,  moist mucous membranes. Chest wall: Patient with 1 cm movable cyst near left axilla.   Lungs: No audible wheezing or coughing. Heart: Regular rate and rhythm. Abdomen: Soft, nontender, no obvious distention. Musculoskeletal: No edema, cyanosis, or clubbing. Neuro: Alert, answering all questions appropriately. Cranial nerves grossly intact. Skin: No rashes or petechiae noted. Psych: Normal affect.   LAB RESULTS:  Lab Results  Component Value Date   NA 136 03/30/2024   K 3.7 03/30/2024   CL 104 03/30/2024   CO2 25 03/30/2024   GLUCOSE 108 (H) 03/30/2024   BUN 13 03/30/2024   CREATININE 0.65 03/30/2024   CALCIUM  9.5 03/30/2024   PROT 7.1 03/30/2024   ALBUMIN 3.3 (L) 03/30/2024   AST 125 (H) 03/30/2024   ALT 91 (H) 03/30/2024   ALKPHOS 90 03/30/2024   BILITOT 1.0 03/30/2024   GFRNONAA >60 03/30/2024   GFRAA 125 03/24/2018    Lab Results  Component Value Date   WBC 4.1 03/30/2024   NEUTROABS 1.8 03/30/2024   HGB 11.9 (L) 03/30/2024   HCT 37.7 (L) 03/30/2024   MCV 90.8 03/30/2024   PLT 171 03/30/2024     STUDIES: No results  found.  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 [employer] ration sequencing reviewed 5% PD-L1 as well as a high tumor mutational burden.  No other actionable mutations were reported.  PET scan results November 09, 2023 confirm stage of disease.  MRI of the brain on December 09, 2023 did not reveal metastatic disease.  Patient completed concurrent chemotherapy with weekly carboplatinum and Taxol  and daily XRT on Feb 16, 2024.  Patient initiated his year-long maintenance durvalumab  on Mar 01, 2024.  Proceed with cycle 2 of treatment today.  Return to clinic in 4 weeks for further evaluation and consideration of cycle 3.  Will repeat imaging in approximately August 2025. Anemia: Hemoglobin continues to trend up and is now  11.9. Hepatitis C: Patient has a mild and chronic transaminitis.  Continue follow-up with GI as recommended. Cyst: Appears benign.  Can consider referral to surgery if enlarges or becomes painful.  I spent a total of 30 minutes reviewing chart data, face-to-face evaluation with the patient, counseling and coordination of care as detailed above.   Patient expressed understanding and was in agreement with this plan. He also understands that He can call clinic at any time with any questions, concerns, or complaints.    Cancer Staging  Lung cancer [facility]) Staging form: Lung, AJCC V9 - Clinical: Stage IIIA (cT4, cN1, cM0) - Signed by Agrawal, Kavita, MD on 11/24/2023   Shellie Dials, MD   03/30/2024 10:47 AM

## 2024-03-30 NOTE — Progress Notes (Signed)
 Patient states he has knot on the right side upper chest wall, across for the port site. Patient states the knot gets sore and itch for time to time. Patient denies any drainage. Patient was told it could be a cyst.

## 2024-04-10 ENCOUNTER — Encounter: Payer: Self-pay | Admitting: Oncology

## 2024-04-10 ENCOUNTER — Encounter: Payer: Self-pay | Admitting: Internal Medicine

## 2024-04-11 NOTE — Addendum Note (Signed)
 Addended by: Sakoya Win on: 04/11/2024 05:45 PM   Modules accepted: Orders

## 2024-04-17 ENCOUNTER — Ambulatory Visit: Payer: Self-pay | Admitting: Surgery

## 2024-04-17 ENCOUNTER — Encounter: Payer: Self-pay | Admitting: Surgery

## 2024-04-17 VITALS — BP 116/83 | HR 98 | Temp 98.0°F | Ht 66.0 in | Wt 139.0 lb

## 2024-04-17 DIAGNOSIS — R222 Localized swelling, mass and lump, trunk: Secondary | ICD-10-CM | POA: Insufficient documentation

## 2024-04-17 NOTE — Patient Instructions (Signed)
Please call the office if you have any questions or concerns. 

## 2024-04-17 NOTE — Progress Notes (Signed)
 Patient ID: Nicholas Mccoy, male   DOB: 12-Jan-1966, 58 y.o.   MRN: [id number]  Chief Complaint: Left lateral chest wall lesion  History of Present Illness [name] is a 58 y.o. male with with a lesion bump he noted in April getting progressively bigger seeming somewhat tethered to underlying tissues.  He denies drainage, denies fevers and chills.  Has no other similar lesion.  He is in the midst of doing his chemotherapy infusions for his right sided lung cancer.  Does indicate it is tender but primarily when he is trying to reach, stretching the underlying pectoral or latissimus muscles. He is in treatment for lung cancer diagnosed in January of this year.  Past Medical History Past Medical History:  Diagnosis Date   Alcohol abuse    Alcohol abuse, daily use 05/04/2023   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)    Hepatitis C    Hyperlipidemia    Hypertension    Pneumonia    x several      Past Surgical History:  Procedure Laterality Date   BRONCHIAL BIOPSY  11/17/2023   Procedure: BRONCHIAL BIOPSIES;  Surgeon: Malka Domino, MD;  Location: [facility] ENDOSCOPY;  Service: Pulmonary;;   BRONCHIAL BRUSHINGS  11/17/2023   Procedure: BRONCHIAL BRUSHINGS;  Surgeon: Malka Domino, MD;  Location: [facility]OS[facility];  Service: Pulmonary;;   BRONCHIAL NEEDLE ASPIRATION BIOPSY  11/17/2023   Procedure: BRONCHIAL NEEDLE ASPIRATION BIOPSIES;  Surgeon: Malka Domino, MD;  Location: [facility] ENDOS[facility];  Service: Pulmonary;;   BRONCHIAL WASHINGS  11/17/2023   Procedure: BRONCHIAL WASHINGS;  Surgeon: Malka Domino, MD;  Location: [facility] ENDOSCOPY;  Service: Pulmonary;;   COLONOSCOPY WITH PROPOFOL  N/A 06/28/2023   Procedure: COLONOSCOPY WITH PROPOFOL ;  Surgeon: Therisa Bi, MD;  Location: [facility] ENDOSCOPY;  Service: Gastroenterology;  Laterality: N/A;   ENDOBRONCHIAL ULTRASOUND Bilateral 11/17/2023   Procedure: ENDOBRONCHIAL ULTRASOUND;  Surgeon: Malka Domino, MD;  Location: [facility]  ENDOSCOPY;  Service: Pulmonary;  Laterality: Bilateral;   FINE NEEDLE ASPIRATION  11/17/2023   Procedure: FINE NEEDLE ASPIRATION (FNA) LINEAR;  Surgeon: Malka Domino, MD;  Location: [facility] ENDOSCOPY;  Service: Pulmonary;;   HEMOSTASIS CONTROL  11/17/2023   Procedure: HEMOSTASIS CONTROL;  Surgeon: Malka Domino, MD;  Location: [facility] ENDOSCOPY;  Service: Pulmonary;;   IR IMAGING GUIDED PORT INSERTION  12/13/2023   POLYPECTOMY  06/28/2023   Procedure: POLYPECTOMY;  Surgeon: Therisa Bi, MD;  Location: [facility] ENDOSCOPY;  Service: Gastroenterology;;   VIDEO BRONCHOSCOPY WITH RADIAL ENDOBRONCHIAL ULTRASOUND  11/17/2023   Procedure: VIDEO BRONCHOSCOPY WITH RADIAL ENDOBRONCHIAL ULTRASOUND;  Surgeon: Malka Domino, MD;  Location: [facility] ENDOSCOPY;  Service: Pulmonary;;    No Known Allergies  Current Outpatient Medications  Medication Sig Dispense Refill   amLODipine  (NORVASC ) 5 MG tablet Take 1 tablet (5 mg total) by mouth daily. 90 tablet 1   gabapentin  (NEURONTIN ) 100 MG capsule Take 2 capsules (200 mg total) by mouth 3 (three) times daily for 20 days. 120 capsule 0   pantoprazole  (PROTONIX ) 40 MG tablet Take 1 tablet (40 mg total) by mouth daily. 90 tablet 1   rosuvastatin  (CRESTOR ) 5 MG tablet Take 1 tablet (5 mg total) by mouth daily. 90 tablet 1   No current facility-administered medications for this visit.    Family History Family History  Problem Relation Age of Onset   Hypertension Father    Heart disease Father    Heart attack Father    Heart attack Sister    Hypertension Brother    Healthy Brother  Sickle cell anemia Sister    Hypertension Mother    COPD Mother    Hypertension Maternal Grandmother    Hypertension Maternal Grandfather    Hypertension Paternal Grandmother    Hypertension Paternal Grandfather       Social History Social History   Tobacco Use   Smoking status: Former    Current packs/day: 0.50    Average packs/day: 0.5 packs/day for 25.0 years (12.5  ttl pk-yrs)    Types: Cigarettes   Smokeless tobacco: Never   Tobacco comments:    Started smoking at 58 years old.    Smoked 1 PPD at his heaviest.    Now smokes 3 cigarettes. Khj 11/09/2023  Vaping Use   Vaping status: Never Used  Substance Use Topics   Alcohol use: Not Currently    Comment: stopped drinking 11/2017   Drug use: No        Review of Systems  Constitutional: Negative.   HENT: Negative.    Eyes: Negative.   Respiratory:  Positive for cough.   Cardiovascular: Negative.   Gastrointestinal: Negative.   Genitourinary: Negative.   Skin: Negative.   Neurological: Negative.   Psychiatric/Behavioral: Negative.       Physical Exam Blood pressure 116/83, pulse 98, temperature 98 F (36.7 C), temperature source Oral, height 5' 6 (1.676 m), weight 139 lb (63 kg), SpO2 96%. Last Weight  Most recent update: 04/17/2024 10:40 AM    Weight  63 kg (139 lb)             CONSTITUTIONAL: Well developed, and nourished, appropriately responsive and aware without distress.   EYES: Sclera non-icteric.   EARS, NOSE, MOUTH AND THROAT:  The oropharynx is clear. Oral mucosa is pink and moist.     Hearing is intact to voice.  NECK: Trachea is midline, and there is no jugular venous distension.  LYMPH NODES:  Lymph nodes in the neck are not appreciated. RESPIRATORY:  Normal respiratory effort without pathologic use of accessory muscles. CARDIOVASCULAR: Heart is regular in rate and rhythm.   Well perfused.  GI: The abdomen is  soft, nontender, and nondistended. There were no palpable masses.  I did not appreciate hepatosplenomegaly. MUSCULOSKELETAL:  Symmetrical muscle tone appreciated in all four extremities.    SKIN: Skin turgor is normal. No pathologic skin lesions appreciated.  The skin of the left lateral chest wall just inferior to his pec transition and anterior portion of the axilla shows a cysts shiny raised mass with extension deep to the dermis in all directions typically  about 5 cm in diameter.  It is not fluctuant and is quite firm, but surprisingly minimally tender to touch. NEUROLOGIC:  Motor and sensation appear grossly normal.  Cranial nerves are grossly without defect. PSYCH:  Alert and oriented to person, place and time. Affect is appropriate for situation.  Data Reviewed I have personally reviewed what is currently available of the patient's imaging, recent labs and medical records.   Labs:     Latest Ref Rng & Units 03/30/2024    9:59 AM 03/01/2024   10:11 AM 02/22/2024    1:58 PM  CBC  WBC 4.0 - 10.5 K/uL 4.1  4.4  2.3   Hemoglobin 13.0 - 17.0 g/dL 88.0  88.7  88.7   Hematocrit 39.0 - 52.0 % 37.7  35.1  33.8   Platelets 150 - 400 K/uL 171  169  132       Latest Ref Rng & Units 03/30/2024  9:59 AM 03/01/2024   10:11 AM 02/16/2024   10:42 AM  CMP  Glucose 70 - 99 mg/dL 891  893  888   BUN 6 - 20 mg/dL 13  12  15    Creatinine 0.61 - 1.24 mg/dL 9.34  9.37  9.23   Sodium 135 - 145 mmol/L 136  133  132   Potassium 3.5 - 5.1 mmol/L 3.7  4.1  4.4   Chloride 98 - 111 mmol/L 104  103  101   CO2 22 - 32 mmol/L 25  24  24    Calcium  8.9 - 10.3 mg/dL 9.5  8.8  8.6   Total Protein 6.5 - 8.1 g/dL 7.1  6.3  6.1   Total Bilirubin 0.0 - 1.2 mg/dL 1.0  1.0  0.8   Alkaline Phos 38 - 126 U/L 90  89  63   AST 15 - 41 U/L 125  78  65   ALT 0 - 44 U/L 91  81  88      Imaging: Radiological images reviewed:   Within last 24 hrs: No results found.  Assessment    Left chest wall mass, possibly cystic or ruptured cystic in nature, potentially neoplastic. Patient Active Problem List   Diagnosis Date Noted   Gastroesophageal reflux disease 03/26/2024   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)    Encounter for antineoplastic chemotherapy 01/12/2024   Lung cancer (HCC) 11/24/2023   Chronic hepatitis C without hepatic coma (HCC) 07/29/2023   Rectal bleeding 06/28/2023   Adenomatous polyp of colon 06/28/2023   Hypercalcemia 05/04/2023   Cramping of hands  05/04/2023   Cramping of feet 05/04/2023   Elevated liver enzymes 05/04/2023   Dyslipidemia, goal LDL below 70 04/06/2023   Essential hypertension 01/01/2019   Biceps tendinitis 03/24/2018   Impingement syndrome of shoulder region 03/24/2018   Current tobacco use 09/15/2015    Plan    We discussed various options, I feel that incision and drainage (if abscess), or incisional biopsy would be the next best step under local anesthesia.  He did not want to do this today, we will set him up to remove complete this in the office in a couple days.  We discussed the risks of bleeding, infection and potential challenges of closing the incision if it is a solid lesion.  Considering he is on chemotherapy I hate to leave him with an open wound but if this is an inflamed cyst likely better decompressed, if it is infected better drained, but is neoplastic better to biopsy, then to pursue excision in the OR with an unknown etiology of this mass.  I personally spent a total of 45 minutes in the care of the patient today including getting/reviewing separately obtained history, performing a medically appropriate exam/evaluation, counseling and educating, and documenting clinical information in the EHR.   These notes generated with voice recognition software. I apologize for typographical errors.  Honor Leghorn M.D., FACS 04/17/2024, 10:41 AM

## 2024-04-19 ENCOUNTER — Encounter: Payer: Self-pay | Admitting: Surgery

## 2024-04-19 ENCOUNTER — Ambulatory Visit (INDEPENDENT_AMBULATORY_CARE_PROVIDER_SITE_OTHER): Admitting: Surgery

## 2024-04-19 VITALS — BP 127/81 | HR 44 | Temp 98.0°F | Ht 66.0 in | Wt 136.0 lb

## 2024-04-19 DIAGNOSIS — R222 Localized swelling, mass and lump, trunk: Secondary | ICD-10-CM | POA: Diagnosis not present

## 2024-04-19 NOTE — Patient Instructions (Addendum)
 Keep the area clean and dry, place a bandage over the area to keep from getting infection we will see you back on 05/01/24   Excision of Mass, Care After The following information offers guidance on how to care for yourself after your procedure. Your health care provider may also give you more specific instructions. If you have problems or questions, contact your health care provider. What can I expect after the procedure? After your procedure, it is common to have: Soreness or mild pain. Some redness and swelling. Follow these instructions at home: Excision site care  Follow instructions from your health care provider about how to take care of your excision site. Make sure you: Wash your hands with soap and water for at least 20 seconds before and after you change your bandage (dressing). If soap and water are not available, use hand sanitizer. Change your dressing as told by your health care provider. Leave stitches (sutures), skin glue, or adhesive strips in place. These skin closures may need to stay in place for 2 weeks or longer. If adhesive strip edges start to loosen and curl up, you may trim the loose edges. Do not remove adhesive strips completely unless your health care provider tells you to do that. Check the excision area every day for signs of infection. Watch for: More redness, swelling, or pain. Fluid or blood. Warmth. Pus or a bad smell. Keep the site clean, dry, and protected for at least 48 hours. For bleeding, apply gentle but firm pressure to the area using a folded towel for 20 minutes. Do not take baths, swim, or use a hot tub until your health care provider approves. Ask your health care provider if you may take showers. You may only be allowed to take sponge baths. General instructions Take over-the-counter and prescription medicines only as told by your health care provider. Follow instructions from your health care provider about how to minimize scarring. Scarring  should lessen over time. Avoid sun exposure until the area has healed. Use sunscreen to protect the area from the sun after it has healed. Avoid high-impact exercise and activities until the sutures are removed or the area heals. Keep all follow-up visits. This is important. Contact a health care provider if: You have more redness, swelling, or pain around your excision site. You have fluid or blood coming from your excision site. Your excision site feels warm to the touch. You have pus or a bad smell coming from your excision site. You have a fever. You have pain that does not improve in 2-3 days after your procedure. Get help right away if: You have bleeding that does not stop with pressure or a dressing. Your wound opens up. Summary Take over-the-counter and prescription medicines only as told by your health care provider. Change your dressing as told by your health care provider. Contact a health care provider if you have redness, swelling, pain, or other signs of infection around your excision site. Keep all follow-up visits. This is important. This information is not intended to replace advice given to you by your health care provider. Make sure you discuss any questions you have with your health care provider. Document Revised: 04/27/2021 Document Reviewed: 04/28/2021 Elsevier Patient Education  2025 [employer].

## 2024-04-19 NOTE — Progress Notes (Signed)
 Incisional biopsy left chest wall mass.  Pre-operative Diagnosis: Left chest wall mass, advanced right lung cancer  Post-operative Diagnosis: same, consistent with neoplasm, final path pending.  Surgeon: Honor Leghorn, M.D., FACS  Anesthesia: Local   Findings: Although the overlying skin was shiny, there was no fluctuance to this mass, and the underlying mass is consistent with a solid neoplastic process.  Estimated Blood Loss: 3 mL         Specimens: Wedge of spherical mass sent for permanent section.          Complications: none              Procedure Details  The patient was evaluated, the benefits, complications, treatment options, and expected outcomes were discussed with the patient. The risks of bleeding, infection, recurrence of symptoms, failure to resolve symptoms, unanticipated injury, any of which could require further surgery were reviewed with the patient. The likelihood of improving the patient's symptoms with return to their baseline status is expected.  The patient and/or family concurred with the proposed plan, giving informed consent.  The patient was taken to our procedure room, identified and the procedure verified.    The patient was positioned in the supine position and the lateral left chest was prepped with  Chloraprep and draped in the sterile fashion.  A Time Out was held and the above information confirmed.  After local anesthetic 1% lidocaine  with epinephrine is infiltrated for adequate anesthetic effect.  I made incision, hoping to encounter an underlying cavity.  However the entire lesion was solid.  And so I wedged out a sufficient specimen to ensure adequate pathology. I then reapproximated the incision with running 4-0 nylon suture for adequate hemostasis and closure.  Sterile dry dressing applied.  Patient tolerated procedure well.

## 2024-04-20 ENCOUNTER — Encounter: Payer: Self-pay | Admitting: Internal Medicine

## 2024-04-20 ENCOUNTER — Other Ambulatory Visit: Payer: Self-pay | Admitting: Surgery

## 2024-04-20 ENCOUNTER — Other Ambulatory Visit: Payer: Self-pay

## 2024-04-20 ENCOUNTER — Encounter: Payer: Self-pay | Admitting: Oncology

## 2024-04-20 MED ORDER — HYDROCODONE-ACETAMINOPHEN 5-325 MG PO TABS
1.0000 | ORAL_TABLET | Freq: Four times a day (QID) | ORAL | 0 refills | Status: DC | PRN
  Filled 2024-04-20: qty 20, 5d supply, fill #0

## 2024-04-20 NOTE — Progress Notes (Signed)
 Rx for Norco 5 #20 to [facility] community pharmacy

## 2024-04-26 ENCOUNTER — Encounter: Payer: Self-pay | Admitting: Oncology

## 2024-04-26 ENCOUNTER — Encounter: Payer: Self-pay | Admitting: *Deleted

## 2024-04-26 ENCOUNTER — Inpatient Hospital Stay

## 2024-04-26 ENCOUNTER — Inpatient Hospital Stay (HOSPITAL_BASED_OUTPATIENT_CLINIC_OR_DEPARTMENT_OTHER): Admitting: Oncology

## 2024-04-26 ENCOUNTER — Inpatient Hospital Stay: Attending: Oncology

## 2024-04-26 VITALS — HR 96

## 2024-04-26 VITALS — BP 110/60 | HR 105 | Temp 98.7°F | Resp 18 | Ht 66.0 in | Wt 139.0 lb

## 2024-04-26 DIAGNOSIS — Z87891 Personal history of nicotine dependence: Secondary | ICD-10-CM | POA: Insufficient documentation

## 2024-04-26 DIAGNOSIS — E785 Hyperlipidemia, unspecified: Secondary | ICD-10-CM | POA: Diagnosis not present

## 2024-04-26 DIAGNOSIS — I10 Essential (primary) hypertension: Secondary | ICD-10-CM | POA: Diagnosis not present

## 2024-04-26 DIAGNOSIS — Z825 Family history of asthma and other chronic lower respiratory diseases: Secondary | ICD-10-CM | POA: Diagnosis not present

## 2024-04-26 DIAGNOSIS — Z5112 Encounter for antineoplastic immunotherapy: Secondary | ICD-10-CM | POA: Insufficient documentation

## 2024-04-26 DIAGNOSIS — C3411 Malignant neoplasm of upper lobe, right bronchus or lung: Secondary | ICD-10-CM | POA: Insufficient documentation

## 2024-04-26 DIAGNOSIS — C3491 Malignant neoplasm of unspecified part of right bronchus or lung: Secondary | ICD-10-CM

## 2024-04-26 DIAGNOSIS — R7401 Elevation of levels of liver transaminase levels: Secondary | ICD-10-CM | POA: Diagnosis not present

## 2024-04-26 DIAGNOSIS — Z8249 Family history of ischemic heart disease and other diseases of the circulatory system: Secondary | ICD-10-CM | POA: Insufficient documentation

## 2024-04-26 DIAGNOSIS — Z79620 Long term (current) use of immunosuppressive biologic: Secondary | ICD-10-CM | POA: Diagnosis not present

## 2024-04-26 DIAGNOSIS — Z79899 Other long term (current) drug therapy: Secondary | ICD-10-CM | POA: Diagnosis not present

## 2024-04-26 LAB — CMP (CANCER CENTER ONLY)
ALT: 51 U/L — ABNORMAL HIGH (ref 0–44)
AST: 62 U/L — ABNORMAL HIGH (ref 15–41)
Albumin: 3.6 g/dL (ref 3.5–5.0)
Alkaline Phosphatase: 72 U/L (ref 38–126)
Anion gap: 10 (ref 5–15)
BUN: 12 mg/dL (ref 6–20)
CO2: 25 mmol/L (ref 22–32)
Calcium: 9.9 mg/dL (ref 8.9–10.3)
Chloride: 103 mmol/L (ref 98–111)
Creatinine: 0.66 mg/dL (ref 0.61–1.24)
GFR, Estimated: >60 mL/min (ref >60)
Glucose, Bld: 98 mg/dL (ref 70–99)
Potassium: 3.8 mmol/L (ref 3.5–5.1)
Sodium: 138 mmol/L (ref 135–145)
Total Bilirubin: 0.7 mg/dL (ref 0.0–1.2)
Total Protein: 7.5 g/dL (ref 6.5–8.1)

## 2024-04-26 LAB — CBC WITH DIFFERENTIAL (CANCER CENTER ONLY)
Abs Immature Granulocytes: 0.02 K/uL (ref 0.00–0.07)
Basophils Absolute: 0 K/uL (ref 0.0–0.1)
Basophils Relative: 0 %
Eosinophils Absolute: 0.2 K/uL (ref 0.0–0.5)
Eosinophils Relative: 4 %
HCT: 43 % (ref 39.0–52.0)
Hemoglobin: 13.4 g/dL (ref 13.0–17.0)
Immature Granulocytes: 0 %
Lymphocytes Relative: 35 %
Lymphs Abs: 1.7 K/uL (ref 0.7–4.0)
MCH: 28.7 pg (ref 26.0–34.0)
MCHC: 31.2 g/dL (ref 30.0–36.0)
MCV: 92.1 fL (ref 80.0–100.0)
Monocytes Absolute: 0.7 K/uL (ref 0.1–1.0)
Monocytes Relative: 15 %
Neutro Abs: 2.2 K/uL (ref 1.7–7.7)
Neutrophils Relative %: 46 %
Platelet Count: 203 K/uL (ref 150–400)
RBC: 4.67 MIL/uL (ref 4.22–5.81)
RDW: 15.2 % (ref 11.5–15.5)
WBC Count: 4.8 K/uL (ref 4.0–10.5)
nRBC: 0 % (ref 0.0–0.2)

## 2024-04-26 LAB — SURGICAL PATHOLOGY

## 2024-04-26 LAB — TSH: TSH: 1.969 u[IU]/mL (ref 0.350–4.500)

## 2024-04-26 MED ORDER — SODIUM CHLORIDE 0.9 % IV SOLN
INTRAVENOUS | Status: DC
  Filled 2024-04-26: qty 250

## 2024-04-26 MED ORDER — HEPARIN SOD (PORK) LOCK FLUSH 100 UNIT/ML IV SOLN
500.0000 [IU] | Freq: Once | INTRAVENOUS | Status: AC | PRN
  Administered 2024-04-26: 500 [IU]
  Filled 2024-04-26: qty 5

## 2024-04-26 MED ORDER — SODIUM CHLORIDE 0.9 % IV SOLN
1500.0000 mg | Freq: Once | INTRAVENOUS | Status: AC
  Administered 2024-04-26: 1500 mg via INTRAVENOUS
  Filled 2024-04-26: qty 30

## 2024-04-26 NOTE — Progress Notes (Addendum)
 Nutrition Follow-up:  Patient with lung cancer.  Completed concurrent chemotherapy and radiation.  Receiving durvalumab .  Had biopsy of left chest wall mass, awaiting final pathology.  Met with patient during infusion.  Reports that his appetite is good.  Drinking equate plus shakes.  Shakes are expensive for patient to purchase.    Medications: reviewed  Labs: reviewed  Anthropometrics:   Weight 139 lb today 133 lb on 5/8 133 lb 9.6 oz on 5/1 132 lb 4.8 oz on 4/17 128 lb on 4/3 131 lb 4.8 oz on 3/4 140 lb on Jan 2025   NUTRITION DIAGNOSIS: Unintentional weight loss improved  Severe malnutrition improved   INTERVENTION:  Message sent to [facility] for follow-up as patient has not heard about authorization.  Heard back from [place] and did not receive paperwork from cancer center on 5/1.  Will ask nursing to re-submit Continue oral nutrition supplements  Continue high calorie, high protein foods    MONITORING, EVALUATION, GOAL: weight trends, intake   NEXT VISIT: Thursday, Aug 14 during infusion  Laine Fonner B. Dasie SOLON, CSO, LDN Registered Dietitian [phone]

## 2024-04-26 NOTE — Progress Notes (Signed)
 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  INTERVAL HISTORY: Patient returns to clinic today for further evaluation and consideration of cycle 3 of monthly maintenance durvalumab .  He currently feels well and is asymptomatic.  He continues to tolerate his treatments without significant side effects.  The lesion on his left chest wall continues to be nontender and unchanged in size.  This was biopsied by surgery last week.  He has no neurologic complaints.  He denies any recent fevers or illnesses.  He has a good appetite and denies weight loss.  He has no chest pain, shortness of breath, cough, or hemoptysis.  He denies any nausea, vomiting, constipation, or diarrhea.  He has no urinary complaints.  Patient offers no further specific complaints today.  REVIEW OF SYSTEMS:   Review of Systems  Constitutional: Negative.  Negative for fever, malaise/fatigue and weight loss.  Respiratory: Negative.  Negative for cough, hemoptysis and shortness of breath.   Cardiovascular: Negative.  Negative for chest pain and leg swelling.  Gastrointestinal: Negative.  Negative for abdominal pain.  Genitourinary: Negative.  Negative for dysuria.  Musculoskeletal: Negative.  Negative for back pain.  Skin: Negative.  Negative for rash.  Neurological: Negative.  Negative for dizziness, focal weakness, weakness and headaches.  Psychiatric/Behavioral: Negative.  The patient is not nervous/anxious.     As per HPI. Otherwise, a complete review of systems is negative.  PAST MEDICAL HISTORY: Past Medical  History:  Diagnosis Date   Alcohol abuse    Alcohol abuse, daily use 05/04/2023   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)    Hepatitis C    Hyperlipidemia    Hypertension    Pneumonia    x several    PAST SURGICAL HISTORY: Past Surgical History:  Procedure Laterality Date   BRONCHIAL BIOPSY  11/17/2023   Procedure: BRONCHIAL BIOPSIES;  Surgeon: Malka Domino, MD;  Location: [facility] ENDOSCOPY;  Service: Pulmonary;;   BRONCHIAL BRUSHINGS  11/17/2023   Procedure: BRONCHIAL BRUSHINGS;  Surgeon: Malka Domino, MD;  Location: [facility];  Service: Pulmonary;;   BRONCHIAL NEEDLE ASPIRATION BIOPSY  11/17/2023   Procedure: BRONCHIAL NEEDLE ASPIRATION BIOPSIES;  Surgeon: Malka Domino, MD;  Location: [facility];  Service: Pulmonary;;   BRONCHIAL WASHINGS  11/17/2023   Procedure: BRONCHIAL WASHINGS;  Surgeon: Malka Domino, MD;  Location: [facility] ENDOSCOPY;  Service: Pulmonary;;   COLONOSCOPY WITH PROPOFOL  N/A 06/28/2023   Procedure: COLONOSCOPY WITH PROPOFOL ;  Surgeon: Therisa Bi, MD;  Location: [facility] ENDOSCOPY;  Service: Gastroenterology;  Laterality: N/A;   ENDOBRONCHIAL ULTRASOUND Bilateral 11/17/2023   Procedure: ENDOBRONCHIAL ULTRASOUND;  Surgeon: Malka Domino, MD;  Location: [facility] ENDOSCOPY;  Service: Pulmonary;  Laterality: Bilateral;   FINE NEEDLE ASPIRATION  11/17/2023   Procedure: FINE NEEDLE ASPIRATION (FNA) LINEAR;  Surgeon: Malka Domino, MD;  Location: [facility]OSCOPY;  Service: Pulmonary;;   HEMOSTASIS CONTROL  11/17/2023   Procedure: HEMOSTASIS CONTROL;  Surgeon: Malka Domino, MD;  Location: [facility] ENDOSCOPY;  Service: Pulmonary;;   IR IMAGING GUIDED PORT INSERTION  12/13/2023   POLYPECTOMY  06/28/2023   Procedure: POLYPECTOMY;  Surgeon: Therisa Bi, MD;  Location: [facility] ENDOSCOPY;  Service: Gastroenterology;;   HARL  BRONCHOSCOPY WITH RADIAL ENDOBRONCHIAL ULTRASOUND  11/17/2023   Procedure: VIDEO BRONCHOSCOPY WITH RADIAL ENDOBRONCHIAL ULTRASOUND;  Surgeon:  Malka Domino, MD;  Location: [facility] ENDOSCOPY;  Service: Pulmonary;;    FAMILY HISTORY: Family History  Problem Relation Age of Onset   Hypertension Father    Heart disease Father    Heart attack Father    Heart attack Sister    Hypertension Brother    Healthy Brother    Sickle cell anemia Sister    Hypertension Mother    COPD Mother    Hypertension Maternal Grandmother    Hypertension Maternal Grandfather    Hypertension Paternal Grandmother    Hypertension Paternal Actor     ADVANCED DIRECTIVES (Y/N):  N  HEALTH MAINTENANCE: Social History   Tobacco Use   Smoking status: Former    Current packs/day: 0.50    Average packs/day: 0.5 packs/day for 25.0 years (12.5 ttl pk-yrs)    Types: Cigarettes   Smokeless tobacco: Never   Tobacco comments:    Started smoking at 58 years old.    Smoked 1 PPD at his heaviest.    Now smokes 3 cigarettes. Khj 11/09/2023  Vaping Use   Vaping status: Never Used  Substance Use Topics   Alcohol use: Not Currently    Comment: stopped drinking 11/2017   Drug use: No     Colonoscopy:  PAP:  Bone density:  Lipid panel:  No Known Allergies  Current Outpatient Medications  Medication Sig Dispense Refill   amLODipine  (NORVASC ) 5 MG tablet Take 1 tablet (5 mg total) by mouth daily. 90 tablet 1   HYDROcodone -acetaminophen  (NORCO/VICODIN) 5-325 MG tablet Take 1 tablet by mouth every 6 (six) hours as needed for moderate pain (pain score 4-6). 20 tablet 0   pantoprazole  (PROTONIX ) 40 MG tablet Take 1 tablet (40 mg total) by mouth daily. 90 tablet 1   rosuvastatin  (CRESTOR ) 5 MG tablet Take 1 tablet (5 mg total) by mouth daily. 90 tablet 1   gabapentin  (NEURONTIN ) 100 MG capsule Take 2 capsules (200 mg total) by mouth 3 (three) times daily for 20 days. (Patient not taking: Reported on 04/26/2024) 120 capsule 0   No current facility-administered medications for this visit.    OBJECTIVE: Vitals:   04/26/24 0914  BP: 110/60  Pulse:  (!) 105  Resp: 18  Temp: 98.7 F (37.1 C)  SpO2: 100%     Body mass index is 22.44 kg/m.    ECOG FS:0 - Asymptomatic  General: Well-developed, well-nourished, no acute distress. Eyes: Pink conjunctiva, anicteric sclera. HEENT: Normocephalic, moist mucous membranes. Chest wall: Easily palpable nontender 4 to 5 cm mass in Lungs: No audible wheezing or coughing. Heart: Regular rate and rhythm. Abdomen: Soft, nontender, no obvious distention. Musculoskeletal: No edema, cyanosis, or clubbing. Neuro: Alert, answering all questions appropriately. Cranial nerves grossly intact. Skin: No rashes or petechiae noted. Psych: Normal affect.  LAB RESULTS:  Lab Results  Component Value Date   NA 136 03/30/2024   K 3.7 03/30/2024   CL 104 03/30/2024   CO2 25 03/30/2024   GLUCOSE 108 (H) 03/30/2024   BUN 13 03/30/2024   CREATININE 0.65 03/30/2024   CALCIUM  9.5 03/30/2024   PROT 7.1 03/30/2024   ALBUMIN 3.3 (L) 03/30/2024   AST 125 (H) 03/30/2024   ALT 91 (H) 03/30/2024   ALKPHOS 90 03/30/2024   BILITOT 1.0 03/30/2024   GFRNONAA >60 03/30/2024   GFRAA 125 03/24/2018    Lab Results  Component Value Date  WBC 4.1 03/30/2024   NEUTROABS 1.8 03/30/2024   HGB 11.9 (L) 03/30/2024   HCT 37.7 (L) 03/30/2024   MCV 90.8 03/30/2024   PLT 171 03/30/2024     STUDIES: No results found.  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 [employer] sequencing reported 5% PD-L1 as well as a high tumor mutational burden.  No other actionable mutations were reported.  PET scan results November 09, 2023 confirmed stage of disease.  MRI of the brain on December 09, 2023 did not reveal metastatic disease.  Patient completed concurrent chemotherapy with weekly carboplatinum and Taxol  and daily XRT on Feb 16, 2024.  Patient initiated his year-long maintenance durvalumab  on Mar 01, 2024.   Proceed with cycle 3 of treatment today.  Return to clinic in 4 weeks for further evaluation and consideration of cycle 4.  Will reimage with PET scan prior to cycle 4.   Anemia: Resolved. Hepatitis C: Patient has a mild and chronic transaminitis.  Continue follow-up with GI as recommended. Left chest wall mass: Appreciate surgical input.  Results of biopsy are pending at time of dictation.  PET scan as above.  I spent a total of 30 minutes reviewing chart data, face-to-face evaluation with the patient, counseling and coordination of care as detailed above.   Patient expressed understanding and was in agreement with this plan. He also understands that He can call clinic at any time with any questions, concerns, or complaints.    Cancer Staging  Lung cancer [facility]) Staging form: Lung, AJCC V9 - Clinical: Stage IIIA (cT4, cN1, cM0) - Signed by Agrawal, Kavita, MD on 11/24/2023   Evalene JINNY Reusing, MD   04/26/2024 9:19 AM

## 2024-04-26 NOTE — Patient Instructions (Signed)
 [facility] - A DEPT OF [facility]  Discharge Instructions: Thank you for choosing [facility] to provide your oncology and hematology care.  If you have a lab appointment with the Cancer Center, please go directly to the Cancer Center and check in at the registration area.  Wear comfortable clothing and clothing appropriate for easy access to any Portacath or PICC line.   We strive to give you quality time with your provider. You may need to reschedule your appointment if you arrive late (15 or more minutes).  Arriving late affects you and other patients whose appointments are after yours.  Also, if you miss three or more appointments without notifying the office, you may be dismissed from the clinic at the provider's discretion.      For prescription refill requests, have your pharmacy contact our office and allow 72 hours for refills to be completed.    Today you received the following chemotherapy and/or immunotherapy agents Imfinzi      To help prevent nausea and vomiting after your treatment, we encourage you to take your nausea medication as directed.  BELOW ARE SYMPTOMS THAT SHOULD BE REPORTED IMMEDIATELY: *FEVER GREATER THAN 100.4 F (38 C) OR HIGHER *CHILLS OR SWEATING *NAUSEA AND VOMITING THAT IS NOT CONTROLLED WITH YOUR NAUSEA MEDICATION *UNUSUAL SHORTNESS OF BREATH *UNUSUAL BRUISING OR BLEEDING *URINARY PROBLEMS (pain or burning when urinating, or frequent urination) *BOWEL PROBLEMS (unusual diarrhea, constipation, pain near the anus) TENDERNESS IN MOUTH AND THROAT WITH OR WITHOUT PRESENCE OF ULCERS (sore throat, sores in mouth, or a toothache) UNUSUAL RASH, SWELLING OR PAIN  UNUSUAL VAGINAL DISCHARGE OR ITCHING   Items with * indicate a potential emergency and should be followed up as soon as possible or go to the Emergency Department if any problems should occur.  Please show the CHEMOTHERAPY ALERT CARD or IMMUNOTHERAPY  ALERT CARD at check-in to the Emergency Department and triage nurse.  Should you have questions after your visit or need to cancel or reschedule your appointment, please contact [facility] - A DEPT OF [facility]  [phone] and follow the prompts.  Office hours are 8:00 a.m. to 4:30 p.m. Monday - Friday. Please note that voicemails left after 4:00 p.m. may not be returned until the following business day.  We are closed weekends and major holidays. You have access to a nurse at all times for urgent questions. Please call the main number to the clinic [phone] and follow the prompts.  For any non-urgent questions, you may also contact your provider using MyChart. We now offer e-Visits for anyone 1 and older to request care online for non-urgent symptoms. For details visit [email or web address].   Also download the MyChart app! Go to the app store, search "MyChart", open the app, select [place], and log in with your MyChart username and password.

## 2024-04-26 NOTE — Patient Instructions (Signed)
 Orders and requested documentation faxed to [employer] for patient to receive Boost. Fax confirmation received.

## 2024-04-26 NOTE — Progress Notes (Signed)
 Patient had a biopsy on the spot under his left side, they sent it off and he was wanting to know if Dr. Georgina has heard anything about the biopsy results? He is doing well.

## 2024-04-27 ENCOUNTER — Telehealth: Payer: Self-pay | Admitting: Surgery

## 2024-04-27 ENCOUNTER — Encounter: Payer: Self-pay | Admitting: Advanced Practice Midwife

## 2024-04-27 LAB — T4: T4, Total: 11.7 ug/dL (ref 4.5–12.0)

## 2024-04-27 NOTE — Telephone Encounter (Signed)
 Pt has called asking that someone give him a call about his results in the My Chart for he does not understand it. It was where he was in the office on April 19 2024. Pt # is [phone]

## 2024-04-30 NOTE — Progress Notes (Signed)
 [facility] POST-OP OFFICE VISIT  05/01/2024  HPI: Nicholas Mccoy is a 58 y.o. male had incisional biopsy of left chest wall mass on  April 27, 2024 , now here for suture removal.  To my understanding he is undergoing metastatic workup, considering this new lesion on the left chest wall.  Currently not anticipating attempt at local excision unless it is indeed an isolated lesion.  I believe radiation treatment was in the planning stage.  Vital signs: BP 123/86   Pulse 97   Temp 98 F (36.7 C) (Oral)   Ht 5' 6 (1.676 m)   Wt 138 lb 12.8 oz (63 kg)   SpO2 98%   BMI 22.40 kg/m    Physical Exam: Constitutional: He appears well,  Skin: Left chest wall lesion with sutures in place.  Sutures were removed and well-tolerated.  It appears to be healing well.  No evidence of drainage, or fluctuance or erythema.  Assessment/Plan: This is a 58 y.o. 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  Patient Active Problem List   Diagnosis Date Noted   Mass of left chest wall 04/17/2024   Gastroesophageal reflux disease 03/26/2024   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)    Encounter for antineoplastic chemotherapy 01/12/2024   Lung cancer (HCC) 11/24/2023   Chronic hepatitis C without hepatic coma (HCC) 07/29/2023   Rectal bleeding 06/28/2023   Adenomatous polyp of colon 06/28/2023   Hypercalcemia 05/04/2023   Cramping of hands 05/04/2023   Cramping of feet 05/04/2023   Elevated liver enzymes 05/04/2023   Dyslipidemia, goal LDL below 70 04/06/2023   Essential hypertension 01/01/2019   Biceps tendinitis 03/24/2018   Impingement syndrome of shoulder region 03/24/2018   Current tobacco use 09/15/2015    - If I can be of any further help would be glad to see this pleasant gentleman.   Honor Leghorn M.D., FACS 05/01/2024, 1:48 PM

## 2024-05-01 ENCOUNTER — Ambulatory Visit (INDEPENDENT_AMBULATORY_CARE_PROVIDER_SITE_OTHER): Admitting: Surgery

## 2024-05-01 ENCOUNTER — Encounter: Payer: Self-pay | Admitting: Surgery

## 2024-05-01 ENCOUNTER — Encounter: Payer: Self-pay | Admitting: *Deleted

## 2024-05-01 VITALS — BP 123/86 | HR 97 | Temp 98.0°F | Ht 66.0 in | Wt 138.8 lb

## 2024-05-01 DIAGNOSIS — C761 Malignant neoplasm of thorax: Secondary | ICD-10-CM | POA: Diagnosis not present

## 2024-05-01 DIAGNOSIS — R222 Localized swelling, mass and lump, trunk: Secondary | ICD-10-CM

## 2024-05-01 NOTE — Progress Notes (Signed)
 Pt requested to have PET scan moved up sooner than 7/30 if possible. Approval received from insurance and appt for PET moved to 7/25 at 0800. Pt made aware and informed that will follow up with him once Dr. Jacobo has reviewed his results. Pt verbalized understanding. Nothing further needed at this time.

## 2024-05-01 NOTE — Patient Instructions (Signed)
 We removed your sutures today, you can keep a band aid over the area if you have any bleeding or drainage. You may shower like normal.   If you have any questions or concerns please give our office a call

## 2024-05-04 ENCOUNTER — Ambulatory Visit
Admission: RE | Admit: 2024-05-04 | Discharge: 2024-05-04 | Disposition: A | Discharge status: Home / Self Care | Source: Ambulatory Visit | Attending: Oncology | Admitting: Oncology

## 2024-05-04 DIAGNOSIS — R222 Localized swelling, mass and lump, trunk: Secondary | ICD-10-CM | POA: Insufficient documentation

## 2024-05-04 DIAGNOSIS — C3491 Malignant neoplasm of unspecified part of right bronchus or lung: Secondary | ICD-10-CM | POA: Insufficient documentation

## 2024-05-04 LAB — GLUCOSE, CAPILLARY: Glucose-Capillary: 89 mg/dL (ref 70–99)

## 2024-05-04 MED ORDER — FLUDEOXYGLUCOSE F - 18 (FDG) INJECTION
7.2700 | Freq: Once | INTRAVENOUS | Status: AC | PRN
  Administered 2024-05-04: 7.27 via INTRAVENOUS

## 2024-05-08 ENCOUNTER — Encounter: Payer: Self-pay | Admitting: *Deleted

## 2024-05-08 ENCOUNTER — Other Ambulatory Visit: Payer: Self-pay | Admitting: *Deleted

## 2024-05-08 NOTE — Progress Notes (Signed)
 Per Dr. Jacobo, pt needs to be added for case conference discussion to compare pathology and review imaging.

## 2024-05-08 NOTE — Progress Notes (Signed)
 Pt made aware of PET scan results and informed that will be discussed at case conference on Thursday 7/31 to get further recommendations. Informed pt that will call with recommendations Thursday afternoon after case conference discussion. All questions answered during call. Pt verbalized understanding.

## 2024-05-09 ENCOUNTER — Ambulatory Visit

## 2024-05-10 ENCOUNTER — Encounter: Payer: Self-pay | Admitting: *Deleted

## 2024-05-10 ENCOUNTER — Other Ambulatory Visit

## 2024-05-10 NOTE — Progress Notes (Signed)
 Pt made aware of MD recommendations after case conference discussion. Informed that his recent biopsy of the left chest wall mass is most likely related to his previous right lung cancer per pathology. Reviewed PET scan results as discussed at conference and informed that radiation is recommended to treat his left chest wall mass. Reviewed upcoming appt with Dr. Lenn on 8/11 at 11:30am. Informed to keep his other appts with Dr. Jacobo for immunotherapy as scheduled. Pt verbalized understanding.

## 2024-05-17 ENCOUNTER — Telehealth: Payer: Self-pay

## 2024-05-17 ENCOUNTER — Telehealth: Payer: Self-pay | Admitting: *Deleted

## 2024-05-17 NOTE — Telephone Encounter (Signed)
 The patient called in saying that he needs to talk to the nutritionist person

## 2024-05-17 NOTE — Telephone Encounter (Signed)
 Nutrition  Spoke with patient via phone.    [employer] is waiting on CMN form signed by MD so oral nutrition supplements can be sent to patient.    Printed form and gave to RN to have MD sign and fax back to [employer].  Discussed with patient.   Patient appreciative of call back.   Nicholas Mccoy SOLON, CSO, LDN Registered Dietitian [phone]

## 2024-05-21 ENCOUNTER — Ambulatory Visit
Admission: RE | Admit: 2024-05-21 | Discharge: 2024-05-21 | Disposition: A | Discharge status: Home / Self Care | Source: Ambulatory Visit | Attending: Radiation Oncology | Admitting: Radiation Oncology

## 2024-05-21 ENCOUNTER — Encounter: Payer: Self-pay | Admitting: Radiation Oncology

## 2024-05-21 VITALS — BP 113/94 | HR 92 | Temp 97.0°F | Resp 14 | Ht 66.0 in | Wt 140.9 lb

## 2024-05-21 DIAGNOSIS — R222 Localized swelling, mass and lump, trunk: Secondary | ICD-10-CM | POA: Insufficient documentation

## 2024-05-21 NOTE — Progress Notes (Signed)
 Radiation Oncology Follow up Note  Name: Nicholas Mccoy   Date:   05/21/2024 MRN:  [id number] DOB: 1966/01/07    This 58 y.o. male presents to the clinic today for reevaluation of metastatic disease to his left chest wall and patient previously treated for stage IIIa adenocarcinoma the right lung.  REFERRING PROVIDER: Jacobo Evalene PARAS, MD  HPI: Patient is a 58 year old male well-known to our department having completed concurrent chemoradiation therapy for stage IIIa (cT4 N1 M0) adenocarcinoma of the right lung back in 4-25.[name]  He is currently been on maintenance Durvalumab  which he is tolerated well.  He has developed a growing mass on his left chest wall which underwent biopsy and is consistent with metastatic disease.  This area was hypermetabolic on recent PET scan back in July.  His right chest mass showed excellent response based on PET scan results with resolution of right hilar adenopathy as well as decrease in size of metabolic activity of the right mid lung mass.  He does have a productive cough which is understandable with changes noted in his chest.  He is seen today for consideration of palliative radiation therapy to his solitary left chest wall metastatic mass.  COMPLICATIONS OF TREATMENT: none  FOLLOW UP COMPLIANCE: keeps appointments   PHYSICAL EXAM:  BP (!) 113/94   Pulse 92   Temp (!) 97 F (36.1 C)   Resp 14   Ht 5' 6 (1.676 m)   Wt 140 lb 14.4 oz (63.9 kg)   BMI 22.74 kg/m  Patient has approximately 3 cm mass of the left chest wall which is fixed to the chest wall and tenting the skin.  Well-developed well-nourished patient in NAD. HEENT reveals PERLA, EOMI, discs not visualized.  Oral cavity is clear. No oral mucosal lesions are identified. Neck is clear without evidence of cervical or supraclavicular adenopathy. Lungs are clear to A&P. Cardiac examination is essentially unremarkable with regular rate and rhythm without murmur rub or thrill. Abdomen is benign  with no organomegaly or masses noted. Motor sensory and DTR levels are equal and symmetric in the upper and lower extremities. Cranial nerves II through XII are grossly intact. Proprioception is intact. No peripheral adenopathy or edema is identified. No motor or sensory levels are noted. Crude visual fields are within normal range.  RADIOLOGY RESULTS: PET CT scan reviewed compatible with above-stated findings  PLAN: At this time like to go ahead with palliative radiation therapy to his left chest wall mass.  Would plan on delivering 30 Gray in 5 fractions using SBRT reatment planning and delivery.  Risks and benefits of treatment including skin reaction fatigue were reviewed with the patient.  I have personally 7 ordered CT simulation for later this week.  Patient comprehends my recommendations well.  I would like to take this opportunity to thank you for allowing me to participate in the care of your patient.[name] Marcey Penton, MD

## 2024-05-23 ENCOUNTER — Ambulatory Visit
Admission: RE | Admit: 2024-05-23 | Discharge: 2024-05-23 | Disposition: A | Discharge status: Home / Self Care | Source: Ambulatory Visit | Attending: Radiation Oncology | Admitting: Radiation Oncology

## 2024-05-23 DIAGNOSIS — R222 Localized swelling, mass and lump, trunk: Secondary | ICD-10-CM | POA: Diagnosis not present

## 2024-05-24 ENCOUNTER — Inpatient Hospital Stay

## 2024-05-24 ENCOUNTER — Inpatient Hospital Stay (HOSPITAL_BASED_OUTPATIENT_CLINIC_OR_DEPARTMENT_OTHER): Attending: Oncology | Admitting: Oncology

## 2024-05-24 ENCOUNTER — Encounter: Payer: Self-pay | Admitting: Oncology

## 2024-05-24 ENCOUNTER — Inpatient Hospital Stay: Attending: Oncology

## 2024-05-24 VITALS — BP 120/88 | HR 89 | Temp 96.3°F | Resp 18 | Ht 66.0 in | Wt 145.0 lb

## 2024-05-24 VITALS — BP 111/78 | HR 89 | Temp 97.0°F | Resp 19

## 2024-05-24 DIAGNOSIS — Z79899 Other long term (current) drug therapy: Secondary | ICD-10-CM | POA: Diagnosis not present

## 2024-05-24 DIAGNOSIS — E785 Hyperlipidemia, unspecified: Secondary | ICD-10-CM | POA: Diagnosis not present

## 2024-05-24 DIAGNOSIS — C3411 Malignant neoplasm of upper lobe, right bronchus or lung: Secondary | ICD-10-CM | POA: Diagnosis present

## 2024-05-24 DIAGNOSIS — Z923 Personal history of irradiation: Secondary | ICD-10-CM | POA: Insufficient documentation

## 2024-05-24 DIAGNOSIS — Z87891 Personal history of nicotine dependence: Secondary | ICD-10-CM | POA: Diagnosis not present

## 2024-05-24 DIAGNOSIS — C3491 Malignant neoplasm of unspecified part of right bronchus or lung: Secondary | ICD-10-CM

## 2024-05-24 DIAGNOSIS — Z8249 Family history of ischemic heart disease and other diseases of the circulatory system: Secondary | ICD-10-CM | POA: Insufficient documentation

## 2024-05-24 DIAGNOSIS — Z5112 Encounter for antineoplastic immunotherapy: Secondary | ICD-10-CM | POA: Insufficient documentation

## 2024-05-24 DIAGNOSIS — Z825 Family history of asthma and other chronic lower respiratory diseases: Secondary | ICD-10-CM | POA: Diagnosis not present

## 2024-05-24 DIAGNOSIS — Z832 Family history of diseases of the blood and blood-forming organs and certain disorders involving the immune mechanism: Secondary | ICD-10-CM | POA: Insufficient documentation

## 2024-05-24 DIAGNOSIS — I10 Essential (primary) hypertension: Secondary | ICD-10-CM | POA: Insufficient documentation

## 2024-05-24 LAB — CBC WITH DIFFERENTIAL (CANCER CENTER ONLY)
Abs Immature Granulocytes: 0.01 K/uL (ref 0.00–0.07)
Basophils Absolute: 0 K/uL (ref 0.0–0.1)
Basophils Relative: 0 %
Eosinophils Absolute: 0.2 K/uL (ref 0.0–0.5)
Eosinophils Relative: 4 %
HCT: 41.2 % (ref 39.0–52.0)
Hemoglobin: 13.1 g/dL (ref 13.0–17.0)
Immature Granulocytes: 0 %
Lymphocytes Relative: 42 %
Lymphs Abs: 2 K/uL (ref 0.7–4.0)
MCH: 29.1 pg (ref 26.0–34.0)
MCHC: 31.8 g/dL (ref 30.0–36.0)
MCV: 91.6 fL (ref 80.0–100.0)
Monocytes Absolute: 0.7 K/uL (ref 0.1–1.0)
Monocytes Relative: 14 %
Neutro Abs: 1.9 K/uL (ref 1.7–7.7)
Neutrophils Relative %: 40 %
Platelet Count: 195 K/uL (ref 150–400)
RBC: 4.5 MIL/uL (ref 4.22–5.81)
RDW: 13.8 % (ref 11.5–15.5)
WBC Count: 4.8 K/uL (ref 4.0–10.5)
nRBC: 0 % (ref 0.0–0.2)

## 2024-05-24 LAB — CMP (CANCER CENTER ONLY)
ALT: 34 U/L (ref 0–44)
AST: 45 U/L — ABNORMAL HIGH (ref 15–41)
Albumin: 3.4 g/dL — ABNORMAL LOW (ref 3.5–5.0)
Alkaline Phosphatase: 54 U/L (ref 38–126)
Anion gap: 9 (ref 5–15)
BUN: 11 mg/dL (ref 6–20)
CO2: 25 mmol/L (ref 22–32)
Calcium: 9.9 mg/dL (ref 8.9–10.3)
Chloride: 102 mmol/L (ref 98–111)
Creatinine: 0.75 mg/dL (ref 0.61–1.24)
GFR, Estimated: >60 mL/min (ref >60)
Glucose, Bld: 131 mg/dL — ABNORMAL HIGH (ref 70–99)
Potassium: 3.7 mmol/L (ref 3.5–5.1)
Sodium: 136 mmol/L (ref 135–145)
Total Bilirubin: 0.3 mg/dL (ref 0.0–1.2)
Total Protein: 7.1 g/dL (ref 6.5–8.1)

## 2024-05-24 MED ORDER — SODIUM CHLORIDE 0.9 % IV SOLN
1500.0000 mg | Freq: Once | INTRAVENOUS | Status: AC
  Administered 2024-05-24: 1500 mg via INTRAVENOUS
  Filled 2024-05-24: qty 30

## 2024-05-24 MED ORDER — SODIUM CHLORIDE 0.9 % IV SOLN
INTRAVENOUS | Status: DC
  Filled 2024-05-24: qty 250

## 2024-05-24 NOTE — Progress Notes (Signed)
 Patient is fixing to start his radiation treatment. He is having some pain where the cancer is which is right under his left arm pit. He would like to see about getting something for pain like oxycodone , which has worked in the past.

## 2024-05-24 NOTE — Progress Notes (Signed)
 Nutrition Follow-up:  Patient with lung cancer.  Completed concurrent chemotherapy and radiation.  Receiving durvalumab .  Starting radiation to left chest wall on 8/25.  Met with patient during infusion. Reports that he received shipment of boost shake from [employer].  Appreciative of those.  Reports good appetite. Eating a variety of foods.      Medications: reviewed  Labs: reviewed  Anthropometrics:   Weight 145 lb today  139 lb on 7/17 133 lb on 5/8 133 lb on 5/1 128 lb on 4/3 131 lb on 3/4 140 lb on Jan 2025  NUTRITION DIAGNOSIS: Unintentional weight loss improved   INTERVENTION:  [employer] providing oral nutrition supplements Continue well balanced diet including foods rich in protein    MONITORING, EVALUATION, GOAL: weight trends, intake   NEXT VISIT: Thur, Oct 9th during infusion  Garlene Apperson B. Dasie SOLON, CSO, LDN Registered Dietitian [phone]

## 2024-05-24 NOTE — Progress Notes (Signed)
 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  INTERVAL HISTORY: Patient returns to clinic today for further evaluation and consideration of cycle 4 of maintenance durvalumab .  He has tenderness on his left flank at the site of his metastatic lesion, but otherwise feels well.  He will start palliative XRT to this area next week.  He has no neurologic complaints.  He denies any recent fevers or illnesses.  He has a good appetite and denies weight loss.  He has no chest pain, shortness of breath, cough, or hemoptysis.  He denies any nausea, vomiting, constipation, or diarrhea.  He has no urinary complaints.  Patient offers no further specific complaints today.  REVIEW OF SYSTEMS:   Review of Systems  Constitutional: Negative.  Negative for fever, malaise/fatigue and weight loss.  Respiratory: Negative.  Negative for cough, hemoptysis and shortness of breath.   Cardiovascular: Negative.  Negative for chest pain and leg swelling.  Gastrointestinal: Negative.  Negative for abdominal pain.  Genitourinary:  Positive for flank pain. Negative for dysuria.  Musculoskeletal:  Negative for back pain.  Skin: Negative.  Negative for rash.  Neurological: Negative.  Negative for dizziness, focal weakness, weakness and headaches.  Psychiatric/Behavioral: Negative.  The patient is not nervous/anxious.     As per HPI. Otherwise, a complete review of systems is negative.  PAST MEDICAL HISTORY: Past Medical History:  Diagnosis Date   Alcohol abuse    Alcohol abuse, daily use 05/04/2023   Cancer of  upper lobe of right lung (HCC)    Hepatitis C    Hyperlipidemia    Hypertension    Pneumonia    x several    PAST SURGICAL HISTORY: Past Surgical History:  Procedure Laterality Date   BRONCHIAL BIOPSY  11/17/2023   Procedure: BRONCHIAL BIOPSIES;  Surgeon: Malka Domino, MD;  Location: [facility] ENDOSCOPY;  Service: Pulmonary;;   BRONCHIAL BRUSHINGS  11/17/2023   Procedure: BRONCHIAL BRUSHINGS;  Surgeon: Malka Domino, MD;  Location: [facility]OS[facility];  Service: Pulmonary;;   BRONCHIAL NEEDLE ASPIRATION BIOPSY  11/17/2023   Procedure: BRONCHIAL NEEDLE ASPIRATION BIOPSIES;  Surgeon: Malka Domino, MD;  Location: [facility] ENDOS[facility];  Service: Pulmonary;;   BRONCHIAL WASHINGS  11/17/2023   Procedure: BRONCHIAL WASHINGS;  Surgeon: Malka Domino, MD;  Location: [facility]OS[facility];  Service: Pulmonary;;   COLONOSCOPY WITH PROPOFOL  N/A 06/28/2023   Procedure: COLONOSCOPY WITH PROPOFOL ;  Surgeon: Therisa Bi, MD;  Location: [facility] ENDOSCOPY;  Service: Gastroenterology;  Laterality: N/A;   ENDOBRONCHIAL ULTRASOUND Bilateral 11/17/2023   Procedure: ENDOBRONCHIAL ULTRASOUND;  Surgeon: Malka Domino, MD;  Location: [facility] ENDOSCOPY;  Service: Pulmonary;  Laterality: Bilateral;   FINE NEEDLE ASPIRATION  11/17/2023   Procedure: FINE NEEDLE ASPIRATION (FNA) LINEAR;  Surgeon: Malka Domino, MD;  Location: [facility]OS[facility];  Service: Pulmonary;;   HEMOSTASIS CONTROL  11/17/2023   Procedure: HEMOSTASIS CONTROL;  Surgeon: Malka Domino, MD;  Location[facility] ENDOSCOPY;  Service: Pulmonary;;   IR IMAGING GUIDED PORT INSERTION  12/13/2023   POLYPECTOMY  06/28/2023   Procedure: POLYPECTOMY;  Surgeon: Therisa Bi, MD;  Location: [facility] ENDOSCOPY;  Service: Gastroenterology;;   VIDEO BRONCHOSCOPY WITH RADIAL ENDOBRONCHIAL ULTRASOUND  11/17/2023   Procedure: VIDEO BRONCHOSCOPY  WITH RADIAL ENDOBRONCHIAL ULTRASOUND;  Surgeon: Malka Domino, MD;  Location: [facility] ENDOSCOPY;  Service: Pulmonary;;    FAMILY  HISTORY: Family History  Problem Relation Age of Onset   Hypertension Father    Heart disease Father    Heart attack Father    Heart attack Sister    Hypertension Brother    Healthy Brother    Sickle cell anemia Sister    Hypertension Mother    COPD Mother    Hypertension Maternal Grandmother    Hypertension Maternal Grandfather    Hypertension Paternal Grandmother    Hypertension Paternal Actor     ADVANCED DIRECTIVES (Y/N):  N  HEALTH MAINTENANCE: Social History   Tobacco Use   Smoking status: Former    Current packs/day: 0.50    Average packs/day: 0.5 packs/day for 25.0 years (12.5 ttl pk-yrs)    Types: Cigarettes   Smokeless tobacco: Never   Tobacco comments:    Started smoking at 58 years old.    Smoked 1 PPD at his heaviest.    Now smokes 3 cigarettes. Khj 11/09/2023  Vaping Use   Vaping status: Never Used  Substance Use Topics   Alcohol use: Not Currently    Comment: stopped drinking 11/2017   Drug use: No     Colonoscopy:  PAP:  Bone density:  Lipid panel:  No Known Allergies  Current Outpatient Medications  Medication Sig Dispense Refill   acetaminophen  (TYLENOL ) 325 MG tablet Take 650 mg by mouth every 6 (six) hours as needed.     amLODipine  (NORVASC ) 5 MG tablet Take 1 tablet (5 mg total) by mouth daily. 90 tablet 1   pantoprazole  (PROTONIX ) 40 MG tablet Take 1 tablet (40 mg total) by mouth daily. 90 tablet 1   rosuvastatin  (CRESTOR ) 5 MG tablet Take 1 tablet (5 mg total) by mouth daily. 90 tablet 1   No current facility-administered medications for this visit.    OBJECTIVE: Vitals:   05/24/24 0831  BP: 120/88  Pulse: 89  Resp: 18  Temp: (!) 96.3 F (35.7 C)  SpO2: 100%     Body mass index is 23.4 kg/m.    ECOG FS:0 - Asymptomatic  General: Well-developed, well-nourished, no acute distress. Eyes: Pink conjunctiva, anicteric sclera. HEENT: Normocephalic, moist mucous membranes. Chest wall: Easily palpable 4 to 5 cm mass on left  flank. Lungs: No audible wheezing or coughing. Heart: Regular rate and rhythm. Abdomen: Soft, nontender, no obvious distention. Musculoskeletal: No edema, cyanosis, or clubbing. Neuro: Alert, answering all questions appropriately. Cranial nerves grossly intact. Skin: No rashes or petechiae noted. Psych: Normal affect.   LAB RESULTS:  Lab Results  Component Value Date   NA 136 05/24/2024   K 3.7 05/24/2024   CL 102 05/24/2024   CO2 25 05/24/2024   GLUCOSE 131 (H) 05/24/2024   BUN 11 05/24/2024   CREATININE 0.75 05/24/2024   CALCIUM  9.9 05/24/2024   PROT 7.1 05/24/2024   ALBUMIN 3.4 (L) 05/24/2024   AST 45 (H) 05/24/2024   ALT 34 05/24/2024   ALKPHOS 54 05/24/2024   BILITOT 0.3 05/24/2024   GFRNONAA >60 05/24/2024   GFRAA 125 03/24/2018    Lab Results  Component Value Date   WBC 4.8 05/24/2024   NEUTROABS 1.9 05/24/2024   HGB 13.1 05/24/2024   HCT 41.2 05/24/2024   MCV 91.6 05/24/2024   PLT 195 05/24/2024     STUDIES: NM PET Image Restag (PS) Skull Base To Thigh Result Date: 05/08/2024 CLINICAL DATA:  Subsequent  treatment strategy for lung carcinoma. EXAM: NUCLEAR MEDICINE PET SKULL BASE TO THIGH TECHNIQUE: 7.3 mCi F-18 FDG was injected intravenously. Full-ring PET imaging was performed from the skull base to thigh after the radiotracer. CT data was obtained and used for attenuation correction and anatomic localization. Fasting blood glucose: 89 mg/dl COMPARISON:  FDG PET scan 11/09/2023 FINDINGS: NECK: No hypermetabolic lymph nodes in the neck. Incidental CT findings: None. CHEST: Two adjacent hypermetabolic masses in the RIGHT mid lung are decreased in size in the interval. Larger mass measures 3.0 cm decreased from 5.7 cm. Smaller adjacent nodule measures 0.8 cm decreased from 3.1 cm. These residual nodules/masses have low peripheral metabolic activity SUV max equal 4.9. Previously intense radiotracer avid lesions with SUV max equal 23. Interval resolution of  hypermetabolic RIGHT hilar mass. There is new rim of peripheral thickening in the RIGHT lower lobe with mild metabolic activity (SUV max equal 5.2). Mild metabolic activity in peribronchial thickening in the RIGHT middle lobe. Findings favored post radiation inflammation in pulmonary change. There is a new intensely hypermetabolic subcutaneous mass in the anterior LEFT chest wall measuring 3.7 cm with SUV max equal 19.2. Incidental CT findings: None. ABDOMEN/PELVIS: No abnormal hypermetabolic activity within the liver, pancreas, adrenal glands, or spleen. No hypermetabolic lymph nodes in the abdomen or pelvis. Incidental CT findings: None. SKELETON: No focal hypermetabolic activity to suggest skeletal metastasis. Incidental CT findings: None. IMPRESSION: 1. Interval decrease in size and metabolic activity of RIGHT mid lung masses. 2. Interval resolution of RIGHT hilar hypermetabolic mass. 3. New hypermetabolic subcutaneous mass in the anterior LEFT chest wall. Findings concerning for new metastatic disease. Lesion tents the skin surface 4. New peripheral thickening in the RIGHT lower lobe and peribronchial thickening in the RIGHT middle lobe with mild metabolic activity favored post radiation inflammation. Electronically Signed   By: Jackquline Boxer M.D.   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 [employer] sequencing reported 5% PD-L1 as well as a high tumor mutational burden.  No other actionable mutations were reported.  PET scan results November 09, 2023 confirmed stage of disease.  MRI of the brain on December 09, 2023 did not reveal metastatic disease.  Patient completed concurrent chemotherapy with weekly carboplatinum and Taxol  and daily XRT on Feb 16, 2024.  Patient initiated his year-long maintenance durvalumab  on Mar 01, 2024.  Proceed with cycle 4 of  treatment today.  Return to clinic in 4 weeks for further evaluation and consideration of cycle 5.   Anemia: Resolved. Hepatitis C: Patient has a mild and chronic transaminitis.  Continue follow-up with GI as recommended. Left chest wall mass: Biopsy-proven to be metastatic lesion.  Case discussed at tumor board and recommendation is to continue durvalumab  as planned and patient will initiate palliative XRT to this area next week.  He was also given a prescription for oxycodone  today for symptomatic relief  I spent a total of 30 minutes reviewing chart data, face-to-face evaluation with the patient, counseling and coordination of care as detailed above.   Patient expressed understanding and was in agreement with this plan. He also understands that He can call clinic at any time with any questions, concerns, or complaints.    Cancer Staging  Lung cancer (HCC) Staging form: Lung, AJCC V9 - Clinical: Stage IIIA (cT4, cN1, cM0) - Signed by Agrawal, Kavita, MD  on 11/24/2023   Evalene JINNY Reusing, MD   05/25/2024 11:42 AM

## 2024-05-25 ENCOUNTER — Encounter: Payer: Self-pay | Admitting: Oncology

## 2024-05-25 ENCOUNTER — Other Ambulatory Visit: Payer: Self-pay | Admitting: *Deleted

## 2024-05-25 ENCOUNTER — Other Ambulatory Visit: Payer: Self-pay

## 2024-05-25 ENCOUNTER — Encounter: Payer: Self-pay | Admitting: Internal Medicine

## 2024-05-25 MED ORDER — OXYCODONE HCL 5 MG PO TABS: 5.0000 mg | ORAL_TABLET | Freq: Four times a day (QID) | ORAL | 0 refills | Status: DC | PRN

## 2024-05-25 MED ORDER — OXYCODONE HCL 5 MG PO TABS
5.0000 mg | ORAL_TABLET | Freq: Four times a day (QID) | ORAL | 0 refills | Status: DC | PRN
  Filled 2024-05-25: qty 30, 8d supply, fill #0

## 2024-05-29 ENCOUNTER — Other Ambulatory Visit: Payer: Self-pay | Admitting: *Deleted

## 2024-05-29 MED ORDER — OXYCODONE HCL 5 MG PO TABS: 5.0000 mg | ORAL_TABLET | Freq: Four times a day (QID) | ORAL | 0 refills | Status: DC | PRN

## 2024-05-31 DIAGNOSIS — R222 Localized swelling, mass and lump, trunk: Secondary | ICD-10-CM | POA: Diagnosis not present

## 2024-06-04 ENCOUNTER — Ambulatory Visit
Admission: RE | Admit: 2024-06-04 | Discharge: 2024-06-04 | Disposition: A | Discharge status: Home / Self Care | Source: Ambulatory Visit | Attending: Radiation Oncology | Admitting: Radiation Oncology

## 2024-06-04 ENCOUNTER — Other Ambulatory Visit: Payer: Self-pay

## 2024-06-04 DIAGNOSIS — R222 Localized swelling, mass and lump, trunk: Secondary | ICD-10-CM | POA: Diagnosis not present

## 2024-06-04 LAB — RAD ONC ARIA SESSION SUMMARY
Course Elapsed Days: 0
Plan Fractions Treated to Date: 1
Plan Prescribed Dose Per Fraction: 10 Gy
Plan Total Fractions Prescribed: 5
Plan Total Prescribed Dose: 50 Gy
Reference Point Dosage Given to Date: 10 Gy
Reference Point Session Dosage Given: 10 Gy
Session Number: 1

## 2024-06-06 ENCOUNTER — Other Ambulatory Visit: Payer: Self-pay

## 2024-06-06 ENCOUNTER — Ambulatory Visit
Admission: RE | Admit: 2024-06-06 | Discharge: 2024-06-06 | Disposition: A | Discharge status: Home / Self Care | Source: Ambulatory Visit | Attending: Radiation Oncology | Admitting: Radiation Oncology

## 2024-06-06 DIAGNOSIS — R222 Localized swelling, mass and lump, trunk: Secondary | ICD-10-CM | POA: Diagnosis not present

## 2024-06-06 LAB — RAD ONC ARIA SESSION SUMMARY
Course Elapsed Days: 2
Plan Fractions Treated to Date: 2
Plan Prescribed Dose Per Fraction: 10 Gy
Plan Total Fractions Prescribed: 5
Plan Total Prescribed Dose: 50 Gy
Reference Point Dosage Given to Date: 20 Gy
Reference Point Session Dosage Given: 10 Gy
Session Number: 2

## 2024-06-12 ENCOUNTER — Other Ambulatory Visit: Payer: Self-pay

## 2024-06-12 ENCOUNTER — Ambulatory Visit
Admission: RE | Admit: 2024-06-12 | Discharge: 2024-06-12 | Disposition: A | Discharge status: Home / Self Care | Source: Ambulatory Visit | Attending: Radiation Oncology | Admitting: Radiation Oncology

## 2024-06-12 ENCOUNTER — Institutional Professional Consult (permissible substitution): Admitting: Radiation Oncology

## 2024-06-12 DIAGNOSIS — R222 Localized swelling, mass and lump, trunk: Secondary | ICD-10-CM | POA: Diagnosis present

## 2024-06-12 LAB — RAD ONC ARIA SESSION SUMMARY
Course Elapsed Days: 8
Plan Fractions Treated to Date: 3
Plan Prescribed Dose Per Fraction: 10 Gy
Plan Total Fractions Prescribed: 5
Plan Total Prescribed Dose: 50 Gy
Reference Point Dosage Given to Date: 30 Gy
Reference Point Session Dosage Given: 10 Gy
Session Number: 3

## 2024-06-13 ENCOUNTER — Ambulatory Visit

## 2024-06-14 ENCOUNTER — Ambulatory Visit
Admission: RE | Admit: 2024-06-14 | Discharge: 2024-06-14 | Disposition: A | Discharge status: Home / Self Care | Source: Ambulatory Visit | Attending: Radiation Oncology | Admitting: Radiation Oncology

## 2024-06-14 ENCOUNTER — Other Ambulatory Visit: Payer: Self-pay

## 2024-06-14 DIAGNOSIS — R222 Localized swelling, mass and lump, trunk: Secondary | ICD-10-CM | POA: Diagnosis not present

## 2024-06-14 LAB — RAD ONC ARIA SESSION SUMMARY
Course Elapsed Days: 10
Plan Fractions Treated to Date: 4
Plan Prescribed Dose Per Fraction: 10 Gy
Plan Total Fractions Prescribed: 5
Plan Total Prescribed Dose: 50 Gy
Reference Point Dosage Given to Date: 40 Gy
Reference Point Session Dosage Given: 10 Gy
Session Number: 4

## 2024-06-18 ENCOUNTER — Ambulatory Visit
Admission: RE | Admit: 2024-06-18 | Discharge: 2024-06-18 | Disposition: A | Discharge status: Home / Self Care | Source: Ambulatory Visit | Attending: Radiation Oncology | Admitting: Radiation Oncology

## 2024-06-18 ENCOUNTER — Other Ambulatory Visit: Payer: Self-pay

## 2024-06-18 DIAGNOSIS — R222 Localized swelling, mass and lump, trunk: Secondary | ICD-10-CM | POA: Diagnosis not present

## 2024-06-18 LAB — RAD ONC ARIA SESSION SUMMARY
Course Elapsed Days: 14
Plan Fractions Treated to Date: 5
Plan Prescribed Dose Per Fraction: 10 Gy
Plan Total Fractions Prescribed: 5
Plan Total Prescribed Dose: 50 Gy
Reference Point Dosage Given to Date: 50 Gy
Reference Point Session Dosage Given: 10 Gy
Session Number: 5

## 2024-06-19 NOTE — Radiation Completion Notes (Signed)
 Patient Name: Nicholas Mccoy, Nicholas Mccoy MRN: [id number] Date of Birth: 1966/09/11 Referring Physician: EVALENE REUSING, M.D. Date of Service: 2024-06-19 Radiation Oncologist: Marcey Penton, M.D. 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  He has developed a growing mass on his left chest wall which underwent biopsy and is consistent with metastatic disease.  This area was hypermetabolic on recent PET scan back in July.  His right chest mass showed excellent response based on PET scan results with resolution of right hilar adenopathy as well as decrease in size of metabolic activity of the right mid lung mass.  He does have a productive cough which is understandable with changes noted in his chest.  He is seen today for consideration of palliative radiation therapy to his solitary left chest wall metastatic mass.      ==========DELIVERED PLANS==========  First Treatment Date: 2024-06-04 Last Treatment Date: 2024-06-18   Plan Name: Chest_L_SBRT Site: Chest, Left Technique: SBRT/SRT-IMRT Mode: Photon Dose Per Fraction: 10 Gy Prescribed Dose (Delivered / Prescribed): 50 Gy / 50 Gy Prescribed Fxs (Delivered / Prescribed): 5 / 5     ==========ON TREATMENT VISIT DATES========== 2024-06-04, 2024-06-06, 2024-06-12, 2024-06-12, 2024-06-14, 2024-06-14, 2024-06-18     ==========UPCOMING VISITS========== 09/13/2024 [facility]C INFUSION [facility]- MO INFUSION CHAIR 15  09/13/2024 [facility] EST  PT REUSING EVALENE PARAS, MD  09/13/2024 [facility] INF PORT FLUSH W/LAB [facility]-PORT FLUSH  08/16/2024 [facility] INFUSION [facility]- MO INFUSION CHAIR 18  08/16/2024 [facility] EST PT REUSING EVALENE PARAS, MD  08/16/2024 [facility] INF PORT FLUSH W/LAB [facility]-PORT FLUSH  07/19/2024 [facility] ON[facility] NUT 45 Dasie Simple, RD  07/19/2024 [facility] INFUSION [facility]- MO INFUSION CHAIR 11  07/19/2024 [facility] EST PT REUSING EVALENE PARAS, MD  07/19/2024 [facility] ON[facility] LAB [facility]-MO LAB  07/18/2024 [facility] ONCOLOGY FOLLOW UP 30 Penton Marcey, MD  06/21/2024 [facility] ON[facility] INFUSION [facility]- MO INFUSION CHAIR 14  06/21/2024 [facility] ON[facility] EST PT REUSING EVALENE PARAS, MD  06/21/2024 [facility] ON[facility] LAB [facility]-MO LAB        ==========APPENDIX - ON TREATMENT VISIT NOTES==========   See weekly On Treatment Notes in Epic for details in the Media tab (listed as Progress notes on the On Treatment Visit Dates listed above).

## 2024-06-20 ENCOUNTER — Ambulatory Visit

## 2024-06-21 ENCOUNTER — Inpatient Hospital Stay

## 2024-06-21 ENCOUNTER — Encounter: Payer: Self-pay | Admitting: Oncology

## 2024-06-21 ENCOUNTER — Inpatient Hospital Stay: Attending: Oncology

## 2024-06-21 ENCOUNTER — Inpatient Hospital Stay (HOSPITAL_BASED_OUTPATIENT_CLINIC_OR_DEPARTMENT_OTHER): Admitting: Oncology

## 2024-06-21 VITALS — BP 121/78 | HR 66

## 2024-06-21 VITALS — BP 112/85 | HR 103 | Temp 98.4°F | Resp 20 | Wt 146.7 lb

## 2024-06-21 DIAGNOSIS — E785 Hyperlipidemia, unspecified: Secondary | ICD-10-CM | POA: Insufficient documentation

## 2024-06-21 DIAGNOSIS — C3411 Malignant neoplasm of upper lobe, right bronchus or lung: Secondary | ICD-10-CM | POA: Diagnosis present

## 2024-06-21 DIAGNOSIS — Z832 Family history of diseases of the blood and blood-forming organs and certain disorders involving the immune mechanism: Secondary | ICD-10-CM | POA: Insufficient documentation

## 2024-06-21 DIAGNOSIS — C3491 Malignant neoplasm of unspecified part of right bronchus or lung: Secondary | ICD-10-CM

## 2024-06-21 DIAGNOSIS — Z8701 Personal history of pneumonia (recurrent): Secondary | ICD-10-CM | POA: Insufficient documentation

## 2024-06-21 DIAGNOSIS — Z79620 Long term (current) use of immunosuppressive biologic: Secondary | ICD-10-CM | POA: Insufficient documentation

## 2024-06-21 DIAGNOSIS — Z87891 Personal history of nicotine dependence: Secondary | ICD-10-CM | POA: Diagnosis not present

## 2024-06-21 DIAGNOSIS — Z5112 Encounter for antineoplastic immunotherapy: Secondary | ICD-10-CM | POA: Diagnosis present

## 2024-06-21 DIAGNOSIS — B1920 Unspecified viral hepatitis C without hepatic coma: Secondary | ICD-10-CM | POA: Diagnosis not present

## 2024-06-21 DIAGNOSIS — I10 Essential (primary) hypertension: Secondary | ICD-10-CM | POA: Diagnosis not present

## 2024-06-21 DIAGNOSIS — Z79899 Other long term (current) drug therapy: Secondary | ICD-10-CM | POA: Diagnosis not present

## 2024-06-21 DIAGNOSIS — Z9221 Personal history of antineoplastic chemotherapy: Secondary | ICD-10-CM | POA: Diagnosis not present

## 2024-06-21 DIAGNOSIS — Z923 Personal history of irradiation: Secondary | ICD-10-CM | POA: Insufficient documentation

## 2024-06-21 DIAGNOSIS — Z825 Family history of asthma and other chronic lower respiratory diseases: Secondary | ICD-10-CM | POA: Diagnosis not present

## 2024-06-21 DIAGNOSIS — Z8619 Personal history of other infectious and parasitic diseases: Secondary | ICD-10-CM | POA: Insufficient documentation

## 2024-06-21 DIAGNOSIS — Z8249 Family history of ischemic heart disease and other diseases of the circulatory system: Secondary | ICD-10-CM | POA: Diagnosis not present

## 2024-06-21 LAB — CBC WITH DIFFERENTIAL (CANCER CENTER ONLY)
Abs Immature Granulocytes: 0.01 K/uL (ref 0.00–0.07)
Basophils Absolute: 0 K/uL (ref 0.0–0.1)
Basophils Relative: 0 %
Eosinophils Absolute: 0.1 K/uL (ref 0.0–0.5)
Eosinophils Relative: 2 %
HCT: 42.8 % (ref 39.0–52.0)
Hemoglobin: 13.9 g/dL (ref 13.0–17.0)
Immature Granulocytes: 0 %
Lymphocytes Relative: 40 %
Lymphs Abs: 1.6 K/uL (ref 0.7–4.0)
MCH: 29.2 pg (ref 26.0–34.0)
MCHC: 32.5 g/dL (ref 30.0–36.0)
MCV: 89.9 fL (ref 80.0–100.0)
Monocytes Absolute: 0.7 K/uL (ref 0.1–1.0)
Monocytes Relative: 16 %
Neutro Abs: 1.7 K/uL (ref 1.7–7.7)
Neutrophils Relative %: 42 %
Platelet Count: 165 K/uL (ref 150–400)
RBC: 4.76 MIL/uL (ref 4.22–5.81)
RDW: 13.9 % (ref 11.5–15.5)
WBC Count: 4 K/uL (ref 4.0–10.5)
nRBC: 0 % (ref 0.0–0.2)

## 2024-06-21 LAB — CMP (CANCER CENTER ONLY)
ALT: 44 U/L (ref 0–44)
AST: 45 U/L — ABNORMAL HIGH (ref 15–41)
Albumin: 3.8 g/dL (ref 3.5–5.0)
Alkaline Phosphatase: 55 U/L (ref 38–126)
Anion gap: 8 (ref 5–15)
BUN: 10 mg/dL (ref 6–20)
CO2: 25 mmol/L (ref 22–32)
Calcium: 9.5 mg/dL (ref 8.9–10.3)
Chloride: 106 mmol/L (ref 98–111)
Creatinine: 0.63 mg/dL (ref 0.61–1.24)
GFR, Estimated: >60 mL/min (ref >60)
Glucose, Bld: 95 mg/dL (ref 70–99)
Potassium: 3.9 mmol/L (ref 3.5–5.1)
Sodium: 139 mmol/L (ref 135–145)
Total Bilirubin: 0.6 mg/dL (ref 0.0–1.2)
Total Protein: 7.1 g/dL (ref 6.5–8.1)

## 2024-06-21 MED ORDER — SODIUM CHLORIDE 0.9 % IV SOLN
INTRAVENOUS | Status: DC
  Filled 2024-06-21: qty 250

## 2024-06-21 MED ORDER — SODIUM CHLORIDE 0.9 % IV SOLN
1500.0000 mg | Freq: Once | INTRAVENOUS | Status: AC
  Administered 2024-06-21: 1500 mg via INTRAVENOUS
  Filled 2024-06-21: qty 30

## 2024-06-21 NOTE — Progress Notes (Signed)
 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  INTERVAL HISTORY: Patient returns to clinic today for further evaluation and consideration of cycle 5 of maintenance durvalumab .  He has now completed XRT to the metastatic lesion on his left flank with significant reduction in size.  He no longer complains of pain. He has no neurologic complaints.  He denies any recent fevers or illnesses.  He has a good appetite and denies weight loss.  He has no chest pain, shortness of breath, cough, or hemoptysis.  He denies any nausea, vomiting, constipation, or diarrhea.  He has no urinary complaints.  Patient offers no specific complaints today.  REVIEW OF SYSTEMS:   Review of Systems  Constitutional: Negative.  Negative for fever, malaise/fatigue and weight loss.  Respiratory: Negative.  Negative for cough, hemoptysis and shortness of breath.   Cardiovascular: Negative.  Negative for chest pain and leg swelling.  Gastrointestinal: Negative.  Negative for abdominal pain.  Genitourinary: Negative.  Negative for dysuria and flank pain.  Musculoskeletal: Negative.  Negative for back pain.  Skin: Negative.  Negative for rash.  Neurological: Negative.  Negative for dizziness, focal weakness, weakness and headaches.  Psychiatric/Behavioral: Negative.  The patient is not nervous/anxious.     As per HPI. Otherwise, a complete review of systems is negative.  PAST MEDICAL HISTORY: Past Medical History:  Diagnosis Date   Alcohol abuse    Alcohol abuse, daily use 05/04/2023   Cancer of upper lobe of  right lung (HCC)    Hepatitis C    Hyperlipidemia    Hypertension    Pneumonia    x several    PAST SURGICAL HISTORY: Past Surgical History:  Procedure Laterality Date   BRONCHIAL BIOPSY  11/17/2023   Procedure: BRONCHIAL BIOPSIES;  Surgeon: Malka Domino, MD;  Location: [facility] ENDOSCOPY;  Service: Pulmonary;;   BRONCHIAL BRUSHINGS  11/17/2023   Procedure: BRONCHIAL BRUSHINGS;  Surgeon: Malka Domino, MD;  Location: [facility];  Service: Pulmonary;;   BRONCHIAL NEEDLE ASPIRATION BIOPSY  11/17/2023   Procedure: BRONCHIAL NEEDLE ASPIRATION BIOPSIES;  Surgeon: Malka Domino, MD;  Location: [facility]OS[facility];  Service: Pulmonary;;   BRONCHIAL WASHINGS  11/17/2023   Procedure: BRONCHIAL WASHINGS;  Surgeon: Malka Domino, MD;  Location: [facility]OS[facility];  Service: Pulmonary;;   COLONOSCOPY WITH PROPOFOL  N/A 06/28/2023   Procedure: COLONOSCOPY WITH PROPOFOL ;  Surgeon: Therisa Bi, MD;  Location: [facility] ENDOSCOPY;  Service: Gastroenterology;  Laterality: N/A;   ENDOBRONCHIAL ULTRASOUND Bilateral 11/17/2023   Procedure: ENDOBRONCHIAL ULTRASOUND;  Surgeon: Malka Domino, MD;  Location: [facility] ENDOSCOPY;  Service: Pulmonary;  Laterality: Bilateral;   FINE NEEDLE ASPIRATION  11/17/2023   Procedure: FINE NEEDLE ASPIRATION (FNA) LINEAR;  Surgeon: Malka Domino, MD;  Location: [facility] ENDOSCOPY;  Service: Pulmonary;;   HEMOSTASIS CONTROL  11/17/2023   Procedure: HEMOSTASIS CONTROL;  Surgeon: Malka Domino, MD;  Location: [facility] ENDOSCOPY;  Service: Pulmonary;;   IR IMAGING GUIDED PORT INSERTION  12/13/2023   POLYPECTOMY  06/28/2023   Procedure: POLYPECTOMY;  Surgeon: Therisa Bi, MD;  Location: [facility] ENDOSCOPY;  Service: Gastroenterology;;   VIDEO BRONCHOSCOPY WITH RADIAL ENDOBRONCHIAL ULTRASOUND  11/17/2023   Procedure: VIDEO BRONCHOSCOPY WITH RADIAL ENDOBRONCHIAL ULTRASOUND;  Surgeon: Malka Domino, MD;  Location: [facility] ENDOSCOPY;  Service: Pulmonary;;    FAMILY HISTORY: Family History   Problem Relation Age of Onset   Hypertension Father    Heart disease Father    Heart attack Father    Heart attack Sister    Hypertension Brother    Healthy Brother    Sickle cell anemia Sister    Hypertension Mother    COPD Mother    Hypertension Maternal Grandmother    Hypertension Maternal Grandfather    Hypertension Paternal Grandmother    Hypertension Paternal Actor     ADVANCED DIRECTIVES (Y/N):  N  HEALTH MAINTENANCE: Social History   Tobacco Use   Smoking status: Former    Current packs/day: 0.50    Average packs/day: 0.5 packs/day for 25.0 years (12.5 ttl pk-yrs)    Types: Cigarettes   Smokeless tobacco: Never   Tobacco comments:    Started smoking at 58 years old.    Smoked 1 PPD at his heaviest.    Now smokes 3 cigarettes. Khj 11/09/2023  Vaping Use   Vaping status: Never Used  Substance Use Topics   Alcohol use: Not Currently    Comment: stopped drinking 11/2017   Drug use: No     Colonoscopy:  PAP:  Bone density:  Lipid panel:  No Known Allergies  Current Outpatient Medications  Medication Sig Dispense Refill   acetaminophen  (TYLENOL ) 325 MG tablet Take 650 mg by mouth every 6 (six) hours as needed.     amLODipine  (NORVASC ) 5 MG tablet Take 1 tablet (5 mg total) by mouth daily. 90 tablet 1   oxyCODONE  (OXY IR/ROXICODONE ) 5 MG immediate release tablet Take 1 tablet (5 mg total) by mouth every 6 (six) hours as needed for severe pain (pain score 7-10). 30 tablet 0   pantoprazole  (PROTONIX ) 40 MG tablet Take 1 tablet (40 mg total) by mouth daily. 90 tablet 1   rosuvastatin  (CRESTOR ) 5 MG tablet Take 1 tablet (5 mg total) by mouth daily. 90 tablet 1   No current facility-administered medications for this visit.    OBJECTIVE: Vitals:   06/21/24 0857  BP: 112/85  Pulse: (!) 103  Resp: 20  Temp: 98.4 F (36.9 C)  SpO2: 100%     Body mass index is 23.68 kg/m.    ECOG FS:0 - Asymptomatic  General: Well-developed, well-nourished, no acute  distress. Eyes: Pink conjunctiva, anicteric sclera. HEENT: Normocephalic, moist mucous membranes. Left flank: Lesion significantly reduced in size, nontender. Lungs: No audible wheezing or coughing. Heart: Regular rate and rhythm. Abdomen: Soft, nontender, no obvious distention. Musculoskeletal: No edema, cyanosis, or clubbing. Neuro: Alert, answering all questions appropriately. Cranial nerves grossly intact. Skin: No rashes or petechiae noted. Psych: Normal affect.   LAB RESULTS:  Lab Results  Component Value Date   NA 139 06/21/2024   K 3.9 06/21/2024   CL 106 06/21/2024   CO2 25 06/21/2024   GLUCOSE 95 06/21/2024   BUN 10 06/21/2024   CREATININE 0.63 06/21/2024   CALCIUM  9.5 06/21/2024   PROT 7.1 06/21/2024   ALBUMIN 3.8 06/21/2024   AST 45 (H) 06/21/2024   ALT 44 06/21/2024   ALKPHOS 55 06/21/2024   BILITOT 0.6 06/21/2024   GFRNONAA >60 06/21/2024   GFRAA 125 03/24/2018    Lab Results  Component Value Date   WBC 4.0 06/21/2024   NEUTROABS 1.7 06/21/2024   HGB 13.9 06/21/2024   HCT 42.8 06/21/2024   MCV 89.9 06/21/2024   PLT 165  06/21/2024     STUDIES: No results found.   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 [employer] sequencing reported 5% PD-L1 as well as a high tumor mutational burden.  No other actionable mutations were reported.  PET scan results November 09, 2023 confirmed stage of disease.  MRI of the brain on December 09, 2023 did not reveal metastatic disease.  Patient completed concurrent chemotherapy with weekly carboplatinum and Taxol  and daily XRT on Feb 16, 2024.  Patient initiated his year-long maintenance durvalumab  on Mar 01, 2024.  Proceed with cycle 5 of treatment today.  Return to clinic in 4 weeks for further evaluation and consideration of cycle 6.  Will reimage in mid December 2025.   Anemia: Resolved. Hepatitis  C: Patient has a mild and chronic transaminitis.  Continue follow-up with GI as recommended. Left chest wall mass: Biopsy-proven to be metastatic lesion.  Patient has now completed XRT with significant improvement.  Will repeat PET scan in mid December as above.   I spent a total of 30 minutes reviewing chart data, face-to-face evaluation with the patient, counseling and coordination of care as detailed above.   Patient expressed understanding and was in agreement with this plan. He also understands that He can call clinic at any time with any questions, concerns, or complaints.    Cancer Staging  Lung cancer [facility]) Staging form: Lung, AJCC V9 - Clinical: Stage IIIA (cT4, cN1, cM0) - Signed by Agrawal, Kavita, MD on 11/24/2023   Evalene JINNY Reusing, MD   06/21/2024 9:27 AM

## 2024-06-21 NOTE — Progress Notes (Signed)
 Patient has no concerns

## 2024-06-21 NOTE — Patient Instructions (Signed)
 [facility] - A DEPT OF [facility]  Discharge Instructions: Thank you for choosing [facility] to provide your oncology and hematology care.  If you have a lab appointment with the Cancer Center, please go directly to the Cancer Center and check in at the registration area.  Wear comfortable clothing and clothing appropriate for easy access to any Portacath or PICC line.   We strive to give you quality time with your provider. You may need to reschedule your appointment if you arrive late (15 or more minutes).  Arriving late affects you and other patients whose appointments are after yours.  Also, if you miss three or more appointments without notifying the office, you may be dismissed from the clinic at the provider's discretion.      For prescription refill requests, have your pharmacy contact our office and allow 72 hours for refills to be completed.    Today you received the following chemotherapy and/or immunotherapy agents imfinzi      To help prevent nausea and vomiting after your treatment, we encourage you to take your nausea medication as directed.  BELOW ARE SYMPTOMS THAT SHOULD BE REPORTED IMMEDIATELY: *FEVER GREATER THAN 100.4 F (38 C) OR HIGHER *CHILLS OR SWEATING *NAUSEA AND VOMITING THAT IS NOT CONTROLLED WITH YOUR NAUSEA MEDICATION *UNUSUAL SHORTNESS OF BREATH *UNUSUAL BRUISING OR BLEEDING *URINARY PROBLEMS (pain or burning when urinating, or frequent urination) *BOWEL PROBLEMS (unusual diarrhea, constipation, pain near the anus) TENDERNESS IN MOUTH AND THROAT WITH OR WITHOUT PRESENCE OF ULCERS (sore throat, sores in mouth, or a toothache) UNUSUAL RASH, SWELLING OR PAIN  UNUSUAL VAGINAL DISCHARGE OR ITCHING   Items with * indicate a potential emergency and should be followed up as soon as possible or go to the Emergency Department if any problems should occur.  Please show the CHEMOTHERAPY ALERT CARD or IMMUNOTHERAPY  ALERT CARD at check-in to the Emergency Department and triage nurse.  Should you have questions after your visit or need to cancel or reschedule your appointment, please contact [facility] - A DEPT OF [facility]  [phone] and follow the prompts.  Office hours are 8:00 a.m. to 4:30 p.m. Monday - Friday. Please note that voicemails left after 4:00 p.m. may not be returned until the following business day.  We are closed weekends and major holidays. You have access to a nurse at all times for urgent questions. Please call the main number to the clinic [phone] and follow the prompts.  For any non-urgent questions, you may also contact your provider using MyChart. We now offer e-Visits for anyone 4 and older to request care online for non-urgent symptoms. For details visit [email or web address].   Also download the MyChart app! Go to the app store, search "MyChart", open the app, select [facility], and log in with your MyChart username and password.

## 2024-07-18 ENCOUNTER — Other Ambulatory Visit: Payer: Self-pay | Admitting: *Deleted

## 2024-07-18 ENCOUNTER — Encounter: Payer: Self-pay | Admitting: Radiation Oncology

## 2024-07-18 ENCOUNTER — Ambulatory Visit
Admission: RE | Admit: 2024-07-18 | Discharge: 2024-07-18 | Attending: Radiation Oncology | Admitting: Radiation Oncology

## 2024-07-18 VITALS — BP 123/81 | HR 94 | Temp 98.7°F | Resp 16 | Wt 145.0 lb

## 2024-07-18 DIAGNOSIS — C342 Malignant neoplasm of middle lobe, bronchus or lung: Secondary | ICD-10-CM

## 2024-07-18 DIAGNOSIS — R222 Localized swelling, mass and lump, trunk: Secondary | ICD-10-CM | POA: Diagnosis present

## 2024-07-18 NOTE — Progress Notes (Signed)
 Radiation Oncology Follow up Note  Name: Nicholas Mccoy   Date:   07/18/2024 MRN:  [id number] DOB: 09-Dec-1965    This 58 y.o. male presents to the clinic today for 1 month follow-up status post SBRT to his left chest wall for metastatic lesion and patient previously treated for stage IIIa adenocarcinoma the right lung.  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  Seen today in routine follow-up he is doing well.  Specifically denies cough hemoptysis chest tightness..  He has no real discernible residual disease on palpation although some soft tissue thickening is present.  COMPLICATIONS OF TREATMENT: none  FOLLOW UP COMPLIANCE: keeps appointments   PHYSICAL EXAM:  BP 123/81   Pulse 94   Temp 98.7 F (37.1 C) (Tympanic)   Resp 16   Wt 145 lb (65.8 kg)   BMI 23.40 kg/m  Well-developed well-nourished patient in NAD. HEENT reveals PERLA, EOMI, discs not visualized.  Oral cavity is clear. No oral mucosal lesions are identified. Neck is clear without evidence of cervical or supraclavicular adenopathy. Lungs are clear to A&P. Cardiac examination is essentially unremarkable with regular rate and rhythm without murmur rub or thrill. Abdomen is benign with no organomegaly or masses noted. Motor sensory and DTR levels are equal and symmetric in the upper and lower extremities. Cranial nerves II through XII are grossly intact. Proprioception is intact. No peripheral adenopathy or edema is identified. No motor or sensory levels are noted. Crude visual fields are within normal range.  RADIOLOGY RESULTS: CT scan of the chest ordered in 3 months  PLAN: As in time patient is doing well very low side effect profile from his SBRT.  Of asked to see him back in 3 months  with a CT scan of his chest prior to that visit.  Patient knows to call with any concerns.  I would like to take this opportunity to thank you for allowing me to participate in the care of your patient.[name] Marcey Penton, MD

## 2024-07-19 ENCOUNTER — Other Ambulatory Visit: Payer: Self-pay

## 2024-07-19 ENCOUNTER — Inpatient Hospital Stay

## 2024-07-19 ENCOUNTER — Inpatient Hospital Stay: Attending: Oncology

## 2024-07-19 ENCOUNTER — Encounter: Payer: Self-pay | Admitting: Oncology

## 2024-07-19 ENCOUNTER — Inpatient Hospital Stay (HOSPITAL_BASED_OUTPATIENT_CLINIC_OR_DEPARTMENT_OTHER): Admitting: Oncology

## 2024-07-19 ENCOUNTER — Other Ambulatory Visit: Payer: Self-pay | Admitting: *Deleted

## 2024-07-19 VITALS — BP 110/75 | HR 82

## 2024-07-19 VITALS — BP 107/90 | HR 75 | Temp 98.9°F | Resp 18 | Ht 66.0 in | Wt 146.1 lb

## 2024-07-19 DIAGNOSIS — B1920 Unspecified viral hepatitis C without hepatic coma: Secondary | ICD-10-CM | POA: Insufficient documentation

## 2024-07-19 DIAGNOSIS — Z9221 Personal history of antineoplastic chemotherapy: Secondary | ICD-10-CM | POA: Diagnosis not present

## 2024-07-19 DIAGNOSIS — Z8619 Personal history of other infectious and parasitic diseases: Secondary | ICD-10-CM | POA: Insufficient documentation

## 2024-07-19 DIAGNOSIS — Z8249 Family history of ischemic heart disease and other diseases of the circulatory system: Secondary | ICD-10-CM | POA: Insufficient documentation

## 2024-07-19 DIAGNOSIS — Z825 Family history of asthma and other chronic lower respiratory diseases: Secondary | ICD-10-CM | POA: Diagnosis not present

## 2024-07-19 DIAGNOSIS — I10 Essential (primary) hypertension: Secondary | ICD-10-CM | POA: Insufficient documentation

## 2024-07-19 DIAGNOSIS — R10A0 Flank pain, unspecified side: Secondary | ICD-10-CM | POA: Insufficient documentation

## 2024-07-19 DIAGNOSIS — C3491 Malignant neoplasm of unspecified part of right bronchus or lung: Secondary | ICD-10-CM | POA: Diagnosis not present

## 2024-07-19 DIAGNOSIS — Z79620 Long term (current) use of immunosuppressive biologic: Secondary | ICD-10-CM | POA: Insufficient documentation

## 2024-07-19 DIAGNOSIS — Z5112 Encounter for antineoplastic immunotherapy: Secondary | ICD-10-CM | POA: Insufficient documentation

## 2024-07-19 DIAGNOSIS — Z79899 Other long term (current) drug therapy: Secondary | ICD-10-CM | POA: Diagnosis not present

## 2024-07-19 DIAGNOSIS — E785 Hyperlipidemia, unspecified: Secondary | ICD-10-CM | POA: Diagnosis not present

## 2024-07-19 DIAGNOSIS — Z8701 Personal history of pneumonia (recurrent): Secondary | ICD-10-CM | POA: Diagnosis not present

## 2024-07-19 DIAGNOSIS — R10A2 Flank pain, left side: Secondary | ICD-10-CM | POA: Diagnosis not present

## 2024-07-19 DIAGNOSIS — Z832 Family history of diseases of the blood and blood-forming organs and certain disorders involving the immune mechanism: Secondary | ICD-10-CM | POA: Insufficient documentation

## 2024-07-19 DIAGNOSIS — Z87891 Personal history of nicotine dependence: Secondary | ICD-10-CM | POA: Insufficient documentation

## 2024-07-19 DIAGNOSIS — C3411 Malignant neoplasm of upper lobe, right bronchus or lung: Secondary | ICD-10-CM | POA: Insufficient documentation

## 2024-07-19 DIAGNOSIS — Z923 Personal history of irradiation: Secondary | ICD-10-CM | POA: Insufficient documentation

## 2024-07-19 LAB — CBC WITH DIFFERENTIAL (CANCER CENTER ONLY)
Abs Immature Granulocytes: 0.01 K/uL (ref 0.00–0.07)
Basophils Absolute: 0 K/uL (ref 0.0–0.1)
Basophils Relative: 0 %
Eosinophils Absolute: 0.1 K/uL (ref 0.0–0.5)
Eosinophils Relative: 3 %
HCT: 48.2 % (ref 39.0–52.0)
Hemoglobin: 15.7 g/dL (ref 13.0–17.0)
Immature Granulocytes: 0 %
Lymphocytes Relative: 46 %
Lymphs Abs: 2.2 K/uL (ref 0.7–4.0)
MCH: 29 pg (ref 26.0–34.0)
MCHC: 32.6 g/dL (ref 30.0–36.0)
MCV: 88.9 fL (ref 80.0–100.0)
Monocytes Absolute: 0.6 K/uL (ref 0.1–1.0)
Monocytes Relative: 13 %
Neutro Abs: 1.8 K/uL (ref 1.7–7.7)
Neutrophils Relative %: 38 %
Platelet Count: 175 K/uL (ref 150–400)
RBC: 5.42 MIL/uL (ref 4.22–5.81)
RDW: 13.6 % (ref 11.5–15.5)
WBC Count: 4.7 K/uL (ref 4.0–10.5)
nRBC: 0 % (ref 0.0–0.2)

## 2024-07-19 LAB — CMP (CANCER CENTER ONLY)
ALT: 79 U/L — ABNORMAL HIGH (ref 0–44)
AST: 70 U/L — ABNORMAL HIGH (ref 15–41)
Albumin: 4.2 g/dL (ref 3.5–5.0)
Alkaline Phosphatase: 68 U/L (ref 38–126)
Anion gap: 9 (ref 5–15)
BUN: 10 mg/dL (ref 6–20)
CO2: 27 mmol/L (ref 22–32)
Calcium: 9.9 mg/dL (ref 8.9–10.3)
Chloride: 100 mmol/L (ref 98–111)
Creatinine: 0.73 mg/dL (ref 0.61–1.24)
GFR, Estimated: >60 mL/min (ref >60)
Glucose, Bld: 82 mg/dL (ref 70–99)
Potassium: 4.2 mmol/L (ref 3.5–5.1)
Sodium: 136 mmol/L (ref 135–145)
Total Bilirubin: 0.6 mg/dL (ref 0.0–1.2)
Total Protein: 8 g/dL (ref 6.5–8.1)

## 2024-07-19 LAB — TSH: TSH: 2.027 u[IU]/mL (ref 0.350–4.500)

## 2024-07-19 MED ORDER — SODIUM CHLORIDE 0.9 % IV SOLN
1500.0000 mg | Freq: Once | INTRAVENOUS | Status: AC
  Administered 2024-07-19: 1500 mg via INTRAVENOUS
  Filled 2024-07-19: qty 30

## 2024-07-19 MED ORDER — OXYCODONE HCL 5 MG PO TABS
5.0000 mg | ORAL_TABLET | Freq: Four times a day (QID) | ORAL | 0 refills | Status: DC | PRN
  Filled 2024-07-19: qty 30, 8d supply, fill #0

## 2024-07-19 MED ORDER — SODIUM CHLORIDE 0.9 % IV SOLN
INTRAVENOUS | Status: DC
  Filled 2024-07-19: qty 250

## 2024-07-19 NOTE — Progress Notes (Signed)
 Nutrition Follow-up:  Patient with lung cancer.  Completed concurrent chemotherapy and radiation.  Receiving durvalumab .    Met with patient during infusion.  Reports that his appetite is good.  Drinking shakes provided by [employer] 3 times a day.  Eating vegetables, beans, some meats (fish, steak).  No nutrition impact symptoms    Medications: reviewed  Labs: reviewed  Anthropometrics:   Weight 146 lb today  145 lb on 8/14 139 lb on 7/17 133 lb on 5/8 133 lb on 5/1 128 lb on 4/3 131 lb on 3/4 140 lb on Jan 2025   NUTRITION DIAGNOSIS: Unintentional weight loss improved    INTERVENTION:  [employer] providing oral nutrition supplements Continue well balanced diet including foods rich in protein    MONITORING, EVALUATION, GOAL: weight trends, intake   NEXT VISIT: as needed  Mackensie Pilson B. Dasie SOLON, CSO, LDN Registered Dietitian [phone]

## 2024-07-19 NOTE — Progress Notes (Signed)
 [facility]  [phone] [phone]) [phone]  ID: Nicholas Mccoy OB: 09/07/1966  MR#: [id number]  RDW#:[id number]  Patient Care Team: Leavy Mole, PA-C as PCP - General (Family Medicine) Verdene Gills, RN as Oncology Nurse Navigator Lenn Aran, MD as Consulting Physician (Radiation Oncology) Jacobo Evalene PARAS, MD as Consulting Physician (Oncology)  CHIEF COMPLAINT: Stage IIIa adenocarcinoma of the right lung.  INTERVAL HISTORY: Patient returns to clinic today for further evaluation and consideration of cycle 6 of maintenance durvalumab .  He continues to have intermittent left flank pain at the site of his XRT, but otherwise feels well.  He has no neurologic complaints.  He denies any recent fevers or illnesses.  He has a good appetite and denies weight loss.  He has no chest pain, shortness of breath, cough, or hemoptysis.  He denies any nausea, vomiting, constipation, or diarrhea.  He has no urinary complaints.  Patient offers no further specific complaints today.  REVIEW OF SYSTEMS:   Review of Systems  Constitutional: Negative.  Negative for fever, malaise/fatigue and weight loss.  Respiratory: Negative.  Negative for cough, hemoptysis and shortness of breath.   Cardiovascular: Negative.  Negative for chest pain and leg swelling.  Gastrointestinal: Negative.  Negative for abdominal pain.  Genitourinary:  Positive for flank pain. Negative for dysuria.  Musculoskeletal:  Negative for back pain.  Skin: Negative.  Negative for rash.  Neurological: Negative.  Negative for dizziness, focal weakness, weakness and headaches.  Psychiatric/Behavioral: Negative.  The patient is not nervous/anxious.     As per HPI. Otherwise, a complete review of systems is negative.  PAST MEDICAL HISTORY: Past Medical History:  Diagnosis Date   Alcohol abuse    Alcohol abuse, daily use 05/04/2023   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)    Hepatitis C     Hyperlipidemia    Hypertension    Pneumonia    x several    PAST SURGICAL HISTORY: Past Surgical History:  Procedure Laterality Date   BRONCHIAL BIOPSY  11/17/2023   Procedure: BRONCHIAL BIOPSIES;  Surgeon: Malka Domino, MD;  Location: [facility] ENDOSCOPY;  Service: Pulmonary;;   BRONCHIAL BRUSHINGS  11/17/2023   Procedure: BRONCHIAL BRUSHINGS;  Surgeon: Malka Domino, MD;  Location: [facility] ENDOSCOPY;  Service: Pulmonary;;   BRONCHIAL NEEDLE ASPIRATION BIOPSY  11/17/2023   Procedure: BRONCHIAL NEEDLE ASPIRATION BIOPSIES;  Surgeon: Malka Domino, MD;  Location: [facility] ENDOSCOPY;  Service: Pulmonary;;   BRONCHIAL WASHINGS  11/17/2023   Procedure: BRONCHIAL WASHINGS;  Surgeon: Malka Domino, MD;  Location: [facility] ENDOSCOPY;  Service: Pulmonary;;   COLONOSCOPY WITH PROPOFOL  N/A 06/28/2023   Procedure: COLONOSCOPY WITH PROPOFOL ;  Surgeon: Therisa Bi, MD;  Location: [facility] ENDOSCOPY;  Service: Gastroenterology;  Laterality: N/A;   ENDOBRONCHIAL ULTRASOUND Bilateral 11/17/2023   Procedure: ENDOBRONCHIAL ULTRASOUND;  Surgeon: Malka Domino, MD;  Location: [facility] ENDOSCOPY;  Service: Pulmonary;  Laterality: Bilateral;   FINE NEEDLE ASPIRATION  11/17/2023   Procedure: FINE NEEDLE ASPIRATION (FNA) LINEAR;  Surgeon: Malka Domino, MD;  Location: [facility] ENDOSCOPY;  Service: Pulmonary;;   HEMOSTASIS CONTROL  11/17/2023   Procedure: HEMOSTASIS CONTROL;  Surgeon: Malka Domino, MD;  Location: [facility] ENDOSCOPY;  Service: Pulmonary;;   IR IMAGING GUIDED PORT INSERTION  12/13/2023   POLYPECTOMY  06/28/2023   Procedure: POLYPECTOMY;  Surgeon: Therisa Bi, MD;  Location: [facility] ENDOSCOPY;  Service: Gastroenterology;;   VIDEO BRONCHOSCOPY WITH RADIAL ENDOBRONCHIAL ULTRASOUND  11/17/2023   Procedure: VIDEO BRONCHOSCOPY WITH RADIAL ENDOBRONCHIAL ULTRASOUND;  Surgeon: Malka Domino, MD;  Location:  [facility] ENDOSCOPY;  Service: Pulmonary;;    FAMILY HISTORY: Family History  Problem Relation Age of Onset    Hypertension Father    Heart disease Father    Heart attack Father    Heart attack Sister    Hypertension Brother    Healthy Brother    Sickle cell anemia Sister    Hypertension Mother    COPD Mother    Hypertension Maternal Grandmother    Hypertension Maternal Grandfather    Hypertension Paternal Grandmother    Hypertension Paternal Actor     ADVANCED DIRECTIVES (Y/N):  N  HEALTH MAINTENANCE: Social History   Tobacco Use   Smoking status: Former    Current packs/day: 0.50    Average packs/day: 0.5 packs/day for 25.0 years (12.5 ttl pk-yrs)    Types: Cigarettes   Smokeless tobacco: Never   Tobacco comments:    Started smoking at 58 years old.    Smoked 1 PPD at his heaviest.    Now smokes 3 cigarettes. Khj 11/09/2023  Vaping Use   Vaping status: Never Used  Substance Use Topics   Alcohol use: Not Currently    Comment: stopped drinking 11/2017   Drug use: No     Colonoscopy:  PAP:  Bone density:  Lipid panel:  No Known Allergies  Current Outpatient Medications  Medication Sig Dispense Refill   acetaminophen  (TYLENOL ) 325 MG tablet Take 650 mg by mouth every 6 (six) hours as needed.     amLODipine  (NORVASC ) 5 MG tablet Take 1 tablet (5 mg total) by mouth daily. 90 tablet 1   pantoprazole  (PROTONIX ) 40 MG tablet Take 1 tablet (40 mg total) by mouth daily. 90 tablet 1   rosuvastatin  (CRESTOR ) 5 MG tablet Take 1 tablet (5 mg total) by mouth daily. 90 tablet 1   oxyCODONE  (OXY IR/ROXICODONE ) 5 MG immediate release tablet Take 1 tablet (5 mg total) by mouth every 6 (six) hours as needed for severe pain (pain score 7-10). 30 tablet 0   No current facility-administered medications for this visit.   Facility-Administered Medications Ordered in Other Visits  Medication Dose Route Frequency Provider Last Rate Last Admin   0.9 %  sodium chloride  infusion   Intravenous Continuous Jacobo Evalene PARAS, MD   Stopped at 07/19/24 1159    OBJECTIVE: Vitals:   07/19/24  0950  BP: (!) 107/90  Pulse: 75  Resp: 18  Temp: 98.9 F (37.2 C)  SpO2: 100%     Body mass index is 23.58 kg/m.    ECOG FS:0 - Asymptomatic  General: Well-developed, well-nourished, no acute distress. Eyes: Pink conjunctiva, anicteric sclera. HEENT: Normocephalic, moist mucous membranes. Chest wall: Left flank metastatic lesion resolved. Lungs: No audible wheezing or coughing. Heart: Regular rate and rhythm. Abdomen: Soft, nontender, no obvious distention. Musculoskeletal: No edema, cyanosis, or clubbing. Neuro: Alert, answering all questions appropriately. Cranial nerves grossly intact. Skin: No rashes or petechiae noted. Psych: Normal affect.  LAB RESULTS:  Lab Results  Component Value Date   NA 136 07/19/2024   K 4.2 07/19/2024   CL 100 07/19/2024   CO2 27 07/19/2024   GLUCOSE 82 07/19/2024   BUN 10 07/19/2024   CREATININE 0.73 07/19/2024   CALCIUM  9.9 07/19/2024   PROT 8.0 07/19/2024   ALBUMIN 4.2 07/19/2024   AST 70 (H) 07/19/2024   ALT 79 (H) 07/19/2024   ALKPHOS 68 07/19/2024   BILITOT 0.6 07/19/2024   GFRNONAA >60 07/19/2024   GFRAA 125 03/24/2018    Lab Results  Component Value Date   WBC 4.7 07/19/2024   NEUTROABS 1.8 07/19/2024   HGB 15.7 07/19/2024   HCT 48.2 07/19/2024   MCV 88.9 07/19/2024   PLT 175 07/19/2024     STUDIES: No results found.   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 [employer] sequencing reported 5% PD-L1 as well as a high tumor mutational burden.  No other actionable mutations were reported.  PET scan results November 09, 2023 confirmed stage of disease.  MRI of the brain on December 09, 2023 did not reveal metastatic disease.  Patient completed concurrent chemotherapy with weekly carboplatinum and Taxol  and daily XRT on Feb 16, 2024.  Patient initiated his year-long maintenance durvalumab  on Mar 01, 2024.  Proceed with cycle 6 of treatment today.  Return to clinic in 4 weeks for further evaluation and consideration of cycle 7.  Will reimage in mid December 2025.   Hepatitis C: Patient has a mild and chronic transaminitis.  Continue follow-up with GI as recommended. Left flank/chest wall mass: Biopsy-proven to be metastatic lesion.  Patient has now completed XRT with significant improvement.  Will repeat PET scan in mid December as above.  Pain: Patient only uses oxycodone  intermittently.  He was given a refill today.  I spent a total of 30 minutes reviewing chart data, face-to-face evaluation with the patient, counseling and coordination of care as detailed above.   Patient expressed understanding and was in agreement with this plan. He also understands that He can call clinic at any time with any questions, concerns, or complaints.    Cancer Staging  Lung cancer [facility]) Staging form: Lung, AJCC V9 - Clinical: Stage IIIA (cT4, cN1, cM0) - Signed by Agrawal, Kavita, MD on 11/24/2023   Evalene JINNY Reusing, MD   07/19/2024 1:37 PM

## 2024-07-19 NOTE — Patient Instructions (Signed)
 [facility] A [facility]PT OF [facility]  Discharge Instructions: Thank you for choosing [facility] to provide your oncology and hematology care.  If you have a lab appointment with the Cancer Center, please go directly to the Cancer Center and check in at the registration area.  Wear comfortable clothing and clothing appropriate for easy access to any Portacath or PICC line.   We strive to give you quality time with your provider. You may need to reschedule your appointment if you arrive late (15 or more minutes).  Arriving late affects you and other patients whose appointments are after yours.  Also, if you miss three or more appointments without notifying the office, you may be dismissed from the clinic at the provider's discretion.      For prescription refill requests, have your pharmacy contact our office and allow 72 hours for refills to be completed.    Today you received the following chemotherapy and/or immunotherapy agents IMFINZI      To help prevent nausea and vomiting after your treatment, we encourage you to take your nausea medication as directed.  BELOW ARE SYMPTOMS THAT SHOULD BE REPORTED IMMEDIATELY: *FEVER GREATER THAN 100.4 F (38 C) OR HIGHER *CHILLS OR SWEATING *NAUSEA AND VOMITING THAT IS NOT CONTROLLED WITH YOUR NAUSEA MEDICATION *UNUSUAL SHORTNESS OF BREATH *UNUSUAL BRUISING OR BLEEDING *URINARY PROBLEMS (pain or burning when urinating, or frequent urination) *BOWEL PROBLEMS (unusual diarrhea, constipation, pain near the anus) TENDERNESS IN MOUTH AND THROAT WITH OR WITHOUT PRESENCE OF ULCERS (sore throat, sores in mouth, or a toothache) UNUSUAL RASH, SWELLING OR PAIN  UNUSUAL VAGINAL DISCHARGE OR ITCHING   Items with * indicate a potential emergency and should be followed up as soon as possible or go to the Emergency Department if any problems should occur.  Please show the CHEMOTHERAPY ALERT CARD or IMMUNOTHERAPY  ALERT CARD at check-in to the Emergency Department and triage nurse.  Should you have questions after your visit or need to cancel or reschedule your appointment, please contact [facility] - A DEPT OF [facility]cc[facility]  [phone] and follow the prompts.  Office hours are 8:00 a.m. to 4:30 p.m. Monday - Friday. Please note that voicemails left after 4:00 p.m. may not be returned until the following business day.  We are closed weekends and major holidays. You have access to a nurse at all times for urgent questions. Please call the main number to the clinic [phone] and follow the prompts.  For any non-urgent questions, you may also contact your provider using MyChart. We now offer e-Visits for anyone 30 and older to request care online for non-urgent symptoms. For details visit [email or web address].   Also download the MyChart app! Go to the app store, search "MyChart", open the app, select [facility], and log in with your MyChart username and password.  Durvalumab Injection What is this medication? DURVALUMAB (dur VAL ue mab) treats some types of cancer. It works by helping your immune system slow or stop the spread of cancer cells. It is a monoclonal antibody. This medicine may be used for other purposes; ask your health care provider or pharmacist if you have questions. COMMON BRAND NAME(S): IMFINZI What should I tell my care team before I take this medication? They need to know if you have any of these conditions: Allogeneic stem cell transplant (uses someone else's stem cells) Autoimmune diseases, such as Crohn disease, ulcerative  colitis, lupus History of chest radiation Nervous system problems, such as Guillain-Barre syndrome, myasthenia gravis Organ transplant An unusual or allergic reaction to durvalumab, other medications, foods, dyes, or preservatives Pregnant or trying to get pregnant Breast-feeding How should I use this medication? This  medication is infused into a vein. It is given by your care team in a hospital or clinic setting. A special MedGuide will be given to you before each treatment. Be sure to read this information carefully each time. Talk to your care team about the use of this medication in children. Special care may be needed. Overdosage: If you think you have taken too much of this medicine contact a poison control center or emergency room at once. NOTE: This medicine is only for you. Do not share this medicine with others. What if I miss a dose? Keep appointments for follow-up doses. It is important not to miss your dose. Call your care team if you are unable to keep an appointment. What may interact with this medication? Interactions have not been studied. This list may not describe all possible interactions. Give your health care provider a list of all the medicines, herbs, non-prescription drugs, or dietary supplements you use. Also tell them if you smoke, drink alcohol, or use illegal drugs. Some items may interact with your medicine. What should I watch for while using this medication? Your condition will be monitored carefully while you are receiving this medication. You may need blood work while taking this medication. This medication may cause serious skin reactions. They can happen weeks to months after starting the medication. Contact your care team right away if you notice fevers or flu-like symptoms with a rash. The rash may be red or purple and then turn into blisters or peeling of the skin. You may also notice a red rash with swelling of the face, lips, or lymph nodes in your neck or under your arms. Tell your care team right away if you have any change in your eyesight. Talk to your care team if you may be pregnant. Serious birth defects can occur if you take this medication during pregnancy and for 3 months after the last dose. You will need a negative pregnancy test before starting this medication.  Contraception is recommended while taking this medication and for 3 months after the last dose. Your care team can help you find the option that works for you. Do not breastfeed while taking this medication and for 3 months after the last dose. What side effects may I notice from receiving this medication? Side effects that you should report to your care team as soon as possible: Allergic reactions--skin rash, itching, hives, swelling of the face, lips, tongue, or throat Dry cough, shortness of breath or trouble breathing Eye pain, redness, irritation, or discharge with blurry or decreased vision Heart muscle inflammation--unusual weakness or fatigue, shortness of breath, chest pain, fast or irregular heartbeat, dizziness, swelling of the ankles, feet, or hands Hormone gland problems--headache, sensitivity to light, unusual weakness or fatigue, dizziness, fast or irregular heartbeat, increased sensitivity to cold or heat, excessive sweating, constipation, hair loss, increased thirst or amount of urine, tremors or shaking, irritability Infusion reactions--chest pain, shortness of breath or trouble breathing, feeling faint or lightheaded Kidney injury (glomerulonephritis)--decrease in the amount of urine, red or dark brown urine, foamy or bubbly urine, swelling of the ankles, hands, or feet Liver injury--right upper belly pain, loss of appetite, nausea, light-colored stool, dark yellow or brown urine, yellowing skin or eyes,  unusual weakness or fatigue Pain, tingling, or numbness in the hands or feet, muscle weakness, change in vision, confusion or trouble speaking, loss of balance or coordination, trouble walking, seizures Rash, fever, and swollen lymph nodes Redness, blistering, peeling, or loosening of the skin, including inside the mouth Sudden or severe stomach pain, bloody diarrhea, fever, nausea, vomiting Side effects that usually do not require medical attention (report these to your care team  if they continue or are bothersome): Bone, joint, or muscle pain Diarrhea Fatigue Loss of appetite Nausea Skin rash This list may not describe all possible side effects. Call your doctor for medical advice about side effects. You may report side effects to FDA at 1-800-FDA-1088. Where should I keep my medication? This medication is given in a hospital or clinic. It will not be stored at home. NOTE: This sheet is a summary. It may not cover all possible information. If you have questions about this medicine, talk to your doctor, pharmacist, or health care provider.  2024 Elsevier/Gold Standard (2022-02-09 00:00:00)

## 2024-07-19 NOTE — Progress Notes (Signed)
 Patient is having pain were they radiated under his left underarm, he rates his pain at about a 3, he would like to up his pain medication.

## 2024-07-20 LAB — T4: T4, Total: 11.8 ug/dL (ref 4.5–12.0)

## 2024-08-10 ENCOUNTER — Encounter: Payer: Self-pay | Admitting: Oncology

## 2024-08-10 ENCOUNTER — Encounter: Payer: Self-pay | Admitting: Internal Medicine

## 2024-08-11 ENCOUNTER — Other Ambulatory Visit: Payer: Self-pay

## 2024-08-16 ENCOUNTER — Other Ambulatory Visit: Payer: Self-pay | Admitting: *Deleted

## 2024-08-16 ENCOUNTER — Encounter: Payer: Self-pay | Admitting: Oncology

## 2024-08-16 ENCOUNTER — Inpatient Hospital Stay (HOSPITAL_BASED_OUTPATIENT_CLINIC_OR_DEPARTMENT_OTHER): Admitting: Oncology

## 2024-08-16 ENCOUNTER — Other Ambulatory Visit: Payer: Self-pay

## 2024-08-16 ENCOUNTER — Inpatient Hospital Stay: Attending: Oncology

## 2024-08-16 ENCOUNTER — Inpatient Hospital Stay

## 2024-08-16 VITALS — BP 120/93 | HR 100 | Temp 97.3°F | Resp 18 | Ht 66.0 in | Wt 147.7 lb

## 2024-08-16 VITALS — BP 115/77 | HR 72 | Temp 96.2°F | Resp 19

## 2024-08-16 DIAGNOSIS — B1920 Unspecified viral hepatitis C without hepatic coma: Secondary | ICD-10-CM | POA: Insufficient documentation

## 2024-08-16 DIAGNOSIS — C3491 Malignant neoplasm of unspecified part of right bronchus or lung: Secondary | ICD-10-CM | POA: Diagnosis not present

## 2024-08-16 DIAGNOSIS — Z8249 Family history of ischemic heart disease and other diseases of the circulatory system: Secondary | ICD-10-CM | POA: Diagnosis not present

## 2024-08-16 DIAGNOSIS — Z825 Family history of asthma and other chronic lower respiratory diseases: Secondary | ICD-10-CM | POA: Diagnosis not present

## 2024-08-16 DIAGNOSIS — Z9221 Personal history of antineoplastic chemotherapy: Secondary | ICD-10-CM | POA: Insufficient documentation

## 2024-08-16 DIAGNOSIS — Z8619 Personal history of other infectious and parasitic diseases: Secondary | ICD-10-CM | POA: Diagnosis not present

## 2024-08-16 DIAGNOSIS — Z79899 Other long term (current) drug therapy: Secondary | ICD-10-CM | POA: Insufficient documentation

## 2024-08-16 DIAGNOSIS — I10 Essential (primary) hypertension: Secondary | ICD-10-CM | POA: Insufficient documentation

## 2024-08-16 DIAGNOSIS — Z8701 Personal history of pneumonia (recurrent): Secondary | ICD-10-CM | POA: Insufficient documentation

## 2024-08-16 DIAGNOSIS — R10A0 Flank pain, unspecified side: Secondary | ICD-10-CM | POA: Insufficient documentation

## 2024-08-16 DIAGNOSIS — R108A2 Left flank tenderness: Secondary | ICD-10-CM | POA: Insufficient documentation

## 2024-08-16 DIAGNOSIS — Z79620 Long term (current) use of immunosuppressive biologic: Secondary | ICD-10-CM | POA: Diagnosis not present

## 2024-08-16 DIAGNOSIS — Z87891 Personal history of nicotine dependence: Secondary | ICD-10-CM | POA: Diagnosis not present

## 2024-08-16 DIAGNOSIS — E785 Hyperlipidemia, unspecified: Secondary | ICD-10-CM | POA: Diagnosis not present

## 2024-08-16 DIAGNOSIS — Z923 Personal history of irradiation: Secondary | ICD-10-CM | POA: Insufficient documentation

## 2024-08-16 DIAGNOSIS — Z832 Family history of diseases of the blood and blood-forming organs and certain disorders involving the immune mechanism: Secondary | ICD-10-CM | POA: Diagnosis not present

## 2024-08-16 DIAGNOSIS — C3411 Malignant neoplasm of upper lobe, right bronchus or lung: Secondary | ICD-10-CM | POA: Diagnosis present

## 2024-08-16 DIAGNOSIS — Z5112 Encounter for antineoplastic immunotherapy: Secondary | ICD-10-CM | POA: Insufficient documentation

## 2024-08-16 LAB — CBC WITH DIFFERENTIAL (CANCER CENTER ONLY)
Abs Immature Granulocytes: 0.01 K/uL (ref 0.00–0.07)
Basophils Absolute: 0 K/uL (ref 0.0–0.1)
Basophils Relative: 1 %
Eosinophils Absolute: 0.1 K/uL (ref 0.0–0.5)
Eosinophils Relative: 2 %
HCT: 45.2 % (ref 39.0–52.0)
Hemoglobin: 15 g/dL (ref 13.0–17.0)
Immature Granulocytes: 0 %
Lymphocytes Relative: 48 %
Lymphs Abs: 2.7 K/uL (ref 0.7–4.0)
MCH: 29.5 pg (ref 26.0–34.0)
MCHC: 33.2 g/dL (ref 30.0–36.0)
MCV: 88.8 fL (ref 80.0–100.0)
Monocytes Absolute: 0.7 K/uL (ref 0.1–1.0)
Monocytes Relative: 12 %
Neutro Abs: 2.1 K/uL (ref 1.7–7.7)
Neutrophils Relative %: 37 %
Platelet Count: 180 K/uL (ref 150–400)
RBC: 5.09 MIL/uL (ref 4.22–5.81)
RDW: 13.8 % (ref 11.5–15.5)
WBC Count: 5.6 K/uL (ref 4.0–10.5)
nRBC: 0 % (ref 0.0–0.2)

## 2024-08-16 LAB — CMP (CANCER CENTER ONLY)
ALT: 99 U/L — ABNORMAL HIGH (ref 0–44)
AST: 73 U/L — ABNORMAL HIGH (ref 15–41)
Albumin: 3.8 g/dL (ref 3.5–5.0)
Alkaline Phosphatase: 62 U/L (ref 38–126)
Anion gap: 7 (ref 5–15)
BUN: 10 mg/dL (ref 6–20)
CO2: 26 mmol/L (ref 22–32)
Calcium: 9.4 mg/dL (ref 8.9–10.3)
Chloride: 105 mmol/L (ref 98–111)
Creatinine: 0.73 mg/dL (ref 0.61–1.24)
GFR, Estimated: >60 mL/min (ref >60)
Glucose, Bld: 102 mg/dL — ABNORMAL HIGH (ref 70–99)
Potassium: 3.6 mmol/L (ref 3.5–5.1)
Sodium: 138 mmol/L (ref 135–145)
Total Bilirubin: 0.6 mg/dL (ref 0.0–1.2)
Total Protein: 7.3 g/dL (ref 6.5–8.1)

## 2024-08-16 MED ORDER — OXYCODONE HCL 5 MG PO TABS
5.0000 mg | ORAL_TABLET | Freq: Four times a day (QID) | ORAL | 0 refills | Status: DC | PRN
  Filled 2024-08-16: qty 30, 8d supply, fill #0

## 2024-08-16 MED ORDER — SODIUM CHLORIDE 0.9 % IV SOLN
1500.0000 mg | Freq: Once | INTRAVENOUS | Status: AC
  Administered 2024-08-16: 1500 mg via INTRAVENOUS
  Filled 2024-08-16: qty 30

## 2024-08-16 MED ORDER — SODIUM CHLORIDE 0.9 % IV SOLN
INTRAVENOUS | Status: DC
  Filled 2024-08-16: qty 250

## 2024-08-16 NOTE — Progress Notes (Signed)
 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  INTERVAL HISTORY: Patient returns to clinic today for further evaluation and consideration of cycle 7 of maintenance durvalumab .  He continues to have intermittent left flank tenderness, but otherwise feels well.  He is tolerating his treatments without significant side effects.  He has no neurologic complaints.  He denies any recent fevers or illnesses.  He has a good appetite and denies weight loss.  He has no chest pain, shortness of breath, cough, or hemoptysis.  He denies any nausea, vomiting, constipation, or diarrhea.  He has no urinary complaints.  Patient offers no further specific complaints today.  REVIEW OF SYSTEMS:   Review of Systems  Constitutional: Negative.  Negative for fever, malaise/fatigue and weight loss.  Respiratory: Negative.  Negative for cough, hemoptysis and shortness of breath.   Cardiovascular: Negative.  Negative for chest pain and leg swelling.  Gastrointestinal: Negative.  Negative for abdominal pain.  Genitourinary:  Positive for flank pain. Negative for dysuria.  Musculoskeletal:  Negative for back pain.  Skin: Negative.  Negative for rash.  Neurological: Negative.  Negative for dizziness, focal weakness, weakness and headaches.  Psychiatric/Behavioral: Negative.  The patient is not nervous/anxious.     As per HPI. Otherwise, a complete review of systems is negative.  PAST MEDICAL HISTORY: Past Medical History:  Diagnosis Date   Alcohol abuse    Alcohol abuse, daily use 05/04/2023   Cancer of  upper lobe of right lung (HCC)    Hepatitis C    Hyperlipidemia    Hypertension    Pneumonia    x several    PAST SURGICAL HISTORY: Past Surgical History:  Procedure Laterality Date   BRONCHIAL BIOPSY  11/17/2023   Procedure: BRONCHIAL BIOPSIES;  Surgeon: Malka Domino, MD;  Location: [facility] ENDOSCOPY;  Service: Pulmonary;;   BRONCHIAL BRUSHINGS  11/17/2023   Procedure: BRONCHIAL BRUSHINGS;  Surgeon: Malka Domino, MD;  Location: [facility];  Service: Pulmonary;;   BRONCHIAL NEEDLE ASPIRATION BIOPSY  11/17/2023   Procedure: BRONCHIAL NEEDLE ASPIRATION BIOPSIES;  Surgeon: Malka Domino, MD;  Location: [facility];  Service: Pulmonary;;   BRONCHIAL WASHINGS  11/17/2023   Procedure: BRONCHIAL WASHINGS;  Surgeon: Malka Domino, MD;  Location: [facility];  Service: Pulmonary;;   COLONOSCOPY WITH PROPOFOL  N/A 06/28/2023   Procedure: COLONOSCOPY WITH PROPOFOL ;  Surgeon: Therisa Bi, MD;  Location[facility] ENDOSCOPY;  Service: Gastroenterology;  Laterality: N/A;   ENDOBRONCHIAL ULTRASOUND Bilateral 11/17/2023   Procedure: ENDOBRONCHIAL ULTRASOUND;  Surgeon: Malka Domino, MD;  Location: [facility] ENDOSCOPY;  Service: Pulmonary;  Laterality: Bilateral;   FINE NEEDLE ASPIRATION  11/17/2023   Procedure: FINE NEEDLE ASPIRATION (FNA) LINEAR;  Surgeon: Malka Domino, MD;  Location: [facility]OS[facility];  Service: Pulmonary;;   HEMOSTASIS CONTROL  11/17/2023   Procedure: HEMOSTASIS CONTROL;  Surgeon: Malka Domino, MD;  Location: [facility] ENDOSCOPY;  Service: Pulmonary;;   IR IMAGING GUIDED PORT INSERTION  12/13/2023   POLYPECTOMY  06/28/2023   Procedure: POLYPECTOMY;  Surgeon: Therisa Bi, MD;  Location[facility] ENDOSCOPY;  Service: Gastroenterology;;   VIDEO BRONCHOSCOPY WITH RADIAL ENDOBRONCHIAL ULTRASOUND  11/17/2023   Procedure: VIDEO BRONCHOSCOPY WITH RADIAL ENDOBRONCHIAL ULTRASOUND;  Surgeon: Malka,  Darrin, MD;  Location: [facility] ENDOSCOPY;  Service: Pulmonary;;    FAMILY  HISTORY: Family History  Problem Relation Age of Onset   Hypertension Father    Heart disease Father    Heart attack Father    Heart attack Sister    Hypertension Brother    Healthy Brother    Sickle cell anemia Sister    Hypertension Mother    COPD Mother    Hypertension Maternal Grandmother    Hypertension Maternal Grandfather    Hypertension Paternal Grandmother    Hypertension Paternal Actor     ADVANCED DIRECTIVES (Y/N):  N  HEALTH MAINTENANCE: Social History   Tobacco Use   Smoking status: Former    Current packs/day: 0.50    Average packs/day: 0.5 packs/day for 25.0 years (12.5 ttl pk-yrs)    Types: Cigarettes   Smokeless tobacco: Never   Tobacco comments:    Started smoking at 58 years old.    Smoked 1 PPD at his heaviest.    Now smokes 3 cigarettes. Khj 11/09/2023  Vaping Use   Vaping status: Never Used  Substance Use Topics   Alcohol use: Not Currently    Comment: stopped drinking 11/2017   Drug use: No     Colonoscopy:  PAP:  Bone density:  Lipid panel:  No Known Allergies  Current Outpatient Medications  Medication Sig Dispense Refill   acetaminophen  (TYLENOL ) 325 MG tablet Take 650 mg by mouth every 6 (six) hours as needed.     amLODipine  (NORVASC ) 5 MG tablet Take 1 tablet (5 mg total) by mouth daily. 90 tablet 1   lidocaine -prilocaine  (EMLA ) cream APPLY TO AFFECTED AREA ONCE AS DIRECTED     pantoprazole  (PROTONIX ) 40 MG tablet Take 1 tablet (40 mg total) by mouth daily. 90 tablet 1   rosuvastatin  (CRESTOR ) 5 MG tablet Take 1 tablet (5 mg total) by mouth daily. 90 tablet 1   oxyCODONE  (OXY IR/ROXICODONE ) 5 MG immediate release tablet Take 1 tablet (5 mg total) by mouth every 6 (six) hours as needed for severe pain (pain score 7-10). 30 tablet 0   No current facility-administered medications for this visit.   Facility-Administered Medications Ordered in Other Visits  Medication Dose Route Frequency Provider Last Rate Last Admin   0.9 %   sodium chloride  infusion   Intravenous Continuous Leshae Mcclay J, MD 10 mL/hr at 08/16/24 0939 New Bag at 08/16/24 0939   durvalumab  (IMFINZI ) 1,500 mg in sodium chloride  0.9 % 100 mL chemo infusion  1,500 mg Intravenous Once Sanam Marmo J, MD 130 mL/hr at 08/16/24 1007 1,500 mg at 08/16/24 1007    OBJECTIVE: Vitals:   08/16/24 0845  BP: (!) 120/93  Pulse: 100  Resp: 18  Temp: (!) 97.3 F (36.3 C)  SpO2: 100%     Body mass index is 23.84 kg/m.    ECOG FS:0 - Asymptomatic  General: Well-developed, well-nourished, no acute distress. Eyes: Pink conjunctiva, anicteric sclera. HEENT: Normocephalic, moist mucous membranes. Lungs: No audible wheezing or coughing. Heart: Regular rate and rhythm. Abdomen: Soft, nontender, no obvious distention. Musculoskeletal: No edema, cyanosis, or clubbing. Neuro: Alert, answering all questions appropriately. Cranial nerves grossly intact. Skin: No rashes or petechiae noted. Psych: Normal affect.  LAB RESULTS:  Lab Results  Component Value Date   NA 138 08/16/2024   K 3.6 08/16/2024   CL 105 08/16/2024   CO2 26 08/16/2024   GLUCOSE 102 (H) 08/16/2024   BUN 10 08/16/2024   CREATININE 0.73 08/16/2024  CALCIUM  9.4 08/16/2024   PROT 7.3 08/16/2024   ALBUMIN 3.8 08/16/2024   AST 73 (H) 08/16/2024   ALT 99 (H) 08/16/2024   ALKPHOS 62 08/16/2024   BILITOT 0.6 08/16/2024   GFRNONAA >60 08/16/2024   GFRAA 125 03/24/2018    Lab Results  Component Value Date   WBC 5.6 08/16/2024   NEUTROABS 2.1 08/16/2024   HGB 15.0 08/16/2024   HCT 45.2 08/16/2024   MCV 88.8 08/16/2024   PLT 180 08/16/2024     STUDIES: No results found.   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 [employer] sequencing reported 5% PD-L1 as well as a high tumor mutational burden.  No other actionable mutations were reported.  PET scan  results November 09, 2023 confirmed stage of disease.  MRI of the brain on December 09, 2023 did not reveal metastatic disease.  Patient completed concurrent chemotherapy with weekly carboplatinum and Taxol  and daily XRT on Feb 16, 2024.  Patient initiated his year-long maintenance durvalumab  on Mar 01, 2024.  Proceed with cycle 7 of treatment today.  Return to clinic in 4 weeks for further evaluation and consideration of cycle 8.  Will reimage in mid December 2025.   Hepatitis C: Continues to have a chronic transaminitis.  He reports now that these finished XRT, he will schedule an appointment with a GI likely in December.   Left flank/chest wall mass: Resolved.  Biopsy-proven to be metastatic lesion.  Patient has now completed XRT with significant improvement.  Will repeat PET scan in mid December as above.  Pain: Patient only uses oxycodone  intermittently.  He was given a refill today.  I spent a total of 30 minutes reviewing chart data, face-to-face evaluation with the patient, counseling and coordination of care as detailed above.    Patient expressed understanding and was in agreement with this plan. He also understands that He can call clinic at any time with any questions, concerns, or complaints.    Cancer Staging  Lung cancer [facility]) Staging form: Lung, AJCC V9 - Clinical: Stage IIIA (cT4, cN1, cM0) - Signed by Agrawal, Kavita, MD on 11/24/2023   Evalene JINNY Reusing, MD   08/16/2024 10:07 AM

## 2024-08-16 NOTE — Progress Notes (Signed)
 Patient doing okay with no new or acute concerns at this time.

## 2024-09-11 ENCOUNTER — Other Ambulatory Visit: Payer: Self-pay

## 2024-09-13 ENCOUNTER — Other Ambulatory Visit: Payer: Self-pay

## 2024-09-13 ENCOUNTER — Inpatient Hospital Stay: Attending: Oncology

## 2024-09-13 ENCOUNTER — Encounter: Payer: Self-pay | Admitting: Oncology

## 2024-09-13 ENCOUNTER — Telehealth: Payer: Self-pay

## 2024-09-13 ENCOUNTER — Inpatient Hospital Stay: Attending: Oncology | Admitting: Oncology

## 2024-09-13 ENCOUNTER — Inpatient Hospital Stay

## 2024-09-13 VITALS — BP 109/78 | HR 98 | Temp 97.6°F | Resp 20 | Wt 145.9 lb

## 2024-09-13 DIAGNOSIS — I700 Atherosclerosis of aorta: Secondary | ICD-10-CM | POA: Diagnosis not present

## 2024-09-13 DIAGNOSIS — N400 Enlarged prostate without lower urinary tract symptoms: Secondary | ICD-10-CM | POA: Diagnosis not present

## 2024-09-13 DIAGNOSIS — I10 Essential (primary) hypertension: Secondary | ICD-10-CM | POA: Diagnosis not present

## 2024-09-13 DIAGNOSIS — R10A0 Flank pain, unspecified side: Secondary | ICD-10-CM | POA: Diagnosis not present

## 2024-09-13 DIAGNOSIS — C3491 Malignant neoplasm of unspecified part of right bronchus or lung: Secondary | ICD-10-CM

## 2024-09-13 DIAGNOSIS — C3411 Malignant neoplasm of upper lobe, right bronchus or lung: Secondary | ICD-10-CM | POA: Diagnosis present

## 2024-09-13 DIAGNOSIS — Z825 Family history of asthma and other chronic lower respiratory diseases: Secondary | ICD-10-CM | POA: Insufficient documentation

## 2024-09-13 DIAGNOSIS — Z79899 Other long term (current) drug therapy: Secondary | ICD-10-CM | POA: Diagnosis not present

## 2024-09-13 DIAGNOSIS — B1920 Unspecified viral hepatitis C without hepatic coma: Secondary | ICD-10-CM | POA: Diagnosis not present

## 2024-09-13 DIAGNOSIS — Z832 Family history of diseases of the blood and blood-forming organs and certain disorders involving the immune mechanism: Secondary | ICD-10-CM | POA: Insufficient documentation

## 2024-09-13 DIAGNOSIS — J438 Other emphysema: Secondary | ICD-10-CM | POA: Insufficient documentation

## 2024-09-13 DIAGNOSIS — Z8249 Family history of ischemic heart disease and other diseases of the circulatory system: Secondary | ICD-10-CM | POA: Insufficient documentation

## 2024-09-13 DIAGNOSIS — Z9221 Personal history of antineoplastic chemotherapy: Secondary | ICD-10-CM | POA: Insufficient documentation

## 2024-09-13 DIAGNOSIS — J432 Centrilobular emphysema: Secondary | ICD-10-CM | POA: Insufficient documentation

## 2024-09-13 DIAGNOSIS — Z5112 Encounter for antineoplastic immunotherapy: Secondary | ICD-10-CM | POA: Insufficient documentation

## 2024-09-13 DIAGNOSIS — Z79620 Long term (current) use of immunosuppressive biologic: Secondary | ICD-10-CM | POA: Diagnosis not present

## 2024-09-13 DIAGNOSIS — Z923 Personal history of irradiation: Secondary | ICD-10-CM | POA: Diagnosis not present

## 2024-09-13 DIAGNOSIS — Z8701 Personal history of pneumonia (recurrent): Secondary | ICD-10-CM | POA: Diagnosis not present

## 2024-09-13 DIAGNOSIS — Z87891 Personal history of nicotine dependence: Secondary | ICD-10-CM | POA: Diagnosis not present

## 2024-09-13 DIAGNOSIS — Z8619 Personal history of other infectious and parasitic diseases: Secondary | ICD-10-CM | POA: Diagnosis not present

## 2024-09-13 LAB — CBC WITH DIFFERENTIAL (CANCER CENTER ONLY)
Abs Immature Granulocytes: 0.03 K/uL (ref 0.00–0.07)
Basophils Absolute: 0 K/uL (ref 0.0–0.1)
Basophils Relative: 0 %
Eosinophils Absolute: 0.2 K/uL (ref 0.0–0.5)
Eosinophils Relative: 3 %
HCT: 46.6 % (ref 39.0–52.0)
Hemoglobin: 15.2 g/dL (ref 13.0–17.0)
Immature Granulocytes: 1 %
Lymphocytes Relative: 51 %
Lymphs Abs: 2.6 K/uL (ref 0.7–4.0)
MCH: 29.4 pg (ref 26.0–34.0)
MCHC: 32.6 g/dL (ref 30.0–36.0)
MCV: 90.1 fL (ref 80.0–100.0)
Monocytes Absolute: 0.7 K/uL (ref 0.1–1.0)
Monocytes Relative: 14 %
Neutro Abs: 1.5 K/uL — ABNORMAL LOW (ref 1.7–7.7)
Neutrophils Relative %: 31 %
Platelet Count: 167 K/uL (ref 150–400)
RBC: 5.17 MIL/uL (ref 4.22–5.81)
RDW: 13.5 % (ref 11.5–15.5)
WBC Count: 5 K/uL (ref 4.0–10.5)
nRBC: 0 % (ref 0.0–0.2)

## 2024-09-13 LAB — CMP (CANCER CENTER ONLY)
ALT: 273 U/L — ABNORMAL HIGH (ref 0–44)
AST: 258 U/L (ref 15–41)
Albumin: 4.1 g/dL (ref 3.5–5.0)
Alkaline Phosphatase: 85 U/L (ref 38–126)
Anion gap: 11 (ref 5–15)
BUN: 12 mg/dL (ref 6–20)
CO2: 26 mmol/L (ref 22–32)
Calcium: 9.9 mg/dL (ref 8.9–10.3)
Chloride: 104 mmol/L (ref 98–111)
Creatinine: 0.91 mg/dL (ref 0.61–1.24)
GFR, Estimated: >60 mL/min (ref >60)
Glucose, Bld: 118 mg/dL — ABNORMAL HIGH (ref 70–99)
Potassium: 4 mmol/L (ref 3.5–5.1)
Sodium: 140 mmol/L (ref 135–145)
Total Bilirubin: 0.5 mg/dL (ref 0.0–1.2)
Total Protein: 7.4 g/dL (ref 6.5–8.1)

## 2024-09-13 MED ORDER — OXYCODONE HCL 5 MG PO TABS
5.0000 mg | ORAL_TABLET | Freq: Four times a day (QID) | ORAL | 0 refills | Status: DC | PRN
  Filled 2024-09-13: qty 30, 8d supply, fill #0

## 2024-09-13 NOTE — Progress Notes (Unsigned)
 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  INTERVAL HISTORY: Patient returns to clinic today for further evaluation and consideration of cycle 8 of maintenance durvalumab .  He continues to have intermittent flank pain, but otherwise feels well.  He is tolerating his treatment without significant side effects.  He has no neurologic complaints.  He denies any recent fevers or illnesses.  He has a good appetite and denies weight loss.  He has no chest pain, shortness of breath, cough, or hemoptysis.  He denies any abdominal pain.  He denies any nausea, vomiting, constipation, or diarrhea.  He has no urinary complaints.  Patient offers no further specific complaints today.  REVIEW OF SYSTEMS:   Review of Systems  Constitutional: Negative.  Negative for fever, malaise/fatigue and weight loss.  Respiratory: Negative.  Negative for cough, hemoptysis and shortness of breath.   Cardiovascular: Negative.  Negative for chest pain and leg swelling.  Gastrointestinal: Negative.  Negative for abdominal pain.  Genitourinary:  Positive for flank pain. Negative for dysuria.  Musculoskeletal:  Negative for back pain.  Skin: Negative.  Negative for rash.  Neurological: Negative.  Negative for dizziness, focal weakness, weakness and headaches.  Psychiatric/Behavioral: Negative.  The patient is not nervous/anxious.     As per HPI. Otherwise, a complete review of systems is negative.  PAST MEDICAL HISTORY: Past Medical History:  Diagnosis Date   Alcohol abuse    Alcohol abuse, daily  use 05/04/2023   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)    Hepatitis C    Hyperlipidemia    Hypertension    Pneumonia    x several    PAST SURGICAL HISTORY: Past Surgical History:  Procedure Laterality Date   BRONCHIAL BIOPSY  11/17/2023   Procedure: BRONCHIAL BIOPSIES;  Surgeon: Malka Domino, MD;  Location: [facility] ENDOSCOPY;  Service: Pulmonary;;   BRONCHIAL BRUSHINGS  11/17/2023   Procedure: BRONCHIAL BRUSHINGS;  Surgeon: Malka Domino, MD;  Location: [facility] ENDOSCOPY;  Service: Pulmonary;;   BRONCHIAL NEEDLE ASPIRATION BIOPSY  11/17/2023   Procedure: BRONCHIAL NEEDLE ASPIRATION BIOPSIES;  Surgeon: Malka Domino, MD;  Location: [facility]OSCOPY;  Service: Pulmonary;;   BRONCHIAL WASHINGS  11/17/2023   Procedure: BRONCHIAL WASHINGS;  Surgeon: Malka Domino, MD;  Location: [facility]OS[facility];  Service: Pulmonary;;   COLONOSCOPY WITH PROPOFOL  N/A 06/28/2023   Procedure: COLONOSCOPY WITH PROPOFOL ;  Surgeon: Therisa Bi, MD;  Location: [facility] ENDOSCOPY;  Service: Gastroenterology;  Laterality: N/A;   ENDOBRONCHIAL ULTRASOUND Bilateral 11/17/2023   Procedure: ENDOBRONCHIAL ULTRASOUND;  Surgeon: Malka Domino, MD;  Location: [facility] ENDOSCOPY;  Service: Pulmonary;  Laterality: Bilateral;   FINE NEEDLE ASPIRATION  11/17/2023   Procedure: FINE NEEDLE ASPIRATION (FNA) LINEAR;  Surgeon: Malka Domino, MD;  Location: [facility]OSCOPY;  Service: Pulmonary;;   HEMOSTASIS CONTROL  11/17/2023   Procedure: HEMOSTASIS CONTROL;  Surgeon: Malka Domino, MD;  Location: [facility] ENDOSCOPY;  Service: Pulmonary;;   IR IMAGING GUIDED PORT INSERTION  12/13/2023   POLYPECTOMY  06/28/2023   Procedure: POLYPECTOMY;  Surgeon: Therisa Bi, MD;  Location: [facility] ENDOSCOPY;  Service: Gastroenterology;;   VIDEO BRONCHOSCOPY WITH RADIAL ENDOBRONCHIAL ULTRASOUND  11/17/2023   Procedure: VIDEO BRONCHOSCOPY WITH  RADIAL ENDOBRONCHIAL ULTRASOUND;  Surgeon: Malka Domino, MD;  Location: [facility] ENDOSCOPY;  Service:  Pulmonary;;    FAMILY HISTORY: Family History  Problem Relation Age of Onset   Hypertension Father    Heart disease Father    Heart attack Father    Heart attack Sister    Hypertension Brother    Healthy Brother    Sickle cell anemia Sister    Hypertension Mother    COPD Mother    Hypertension Maternal Grandmother    Hypertension Maternal Grandfather    Hypertension Paternal Grandmother    Hypertension Paternal Actor     ADVANCED DIRECTIVES (Y/N):  N  HEALTH MAINTENANCE: Social History   Tobacco Use   Smoking status: Former    Current packs/day: 0.50    Average packs/day: 0.5 packs/day for 25.0 years (12.5 ttl pk-yrs)    Types: Cigarettes   Smokeless tobacco: Never   Tobacco comments:    Started smoking at 58 years old.    Smoked 1 PPD at his heaviest.    Now smokes 3 cigarettes. Khj 11/09/2023  Vaping Use   Vaping status: Never Used  Substance Use Topics   Alcohol use: Not Currently    Comment: stopped drinking 11/2017   Drug use: No     Colonoscopy:  PAP:  Bone density:  Lipid panel:  No Known Allergies  Current Outpatient Medications  Medication Sig Dispense Refill   acetaminophen  (TYLENOL ) 325 MG tablet Take 650 mg by mouth every 6 (six) hours as needed.     amLODipine  (NORVASC ) 5 MG tablet Take 1 tablet (5 mg total) by mouth daily. 90 tablet 1   lidocaine -prilocaine  (EMLA ) cream APPLY TO AFFECTED AREA ONCE AS DIRECTED     pantoprazole  (PROTONIX ) 40 MG tablet Take 1 tablet (40 mg total) by mouth daily. 90 tablet 1   rosuvastatin  (CRESTOR ) 5 MG tablet Take 1 tablet (5 mg total) by mouth daily. 90 tablet 1   oxyCODONE  (OXY IR/ROXICODONE ) 5 MG immediate release tablet Take 1 tablet (5 mg total) by mouth every 6 (six) hours as needed for severe pain (pain score 7-10). 30 tablet 0   No current facility-administered medications for this visit.    OBJECTIVE: Vitals:   09/13/24 1000  BP: 109/78  Pulse: 98  Resp: 20  Temp: 97.6 F (36.4 C)  SpO2:  100%     Body mass index is 23.55 kg/m.    ECOG FS:0 - Asymptomatic  General: Well-developed, well-nourished, no acute distress. Eyes: Pink conjunctiva, anicteric sclera. HEENT: Normocephalic, moist mucous membranes. Lungs: No audible wheezing or coughing. Heart: Regular rate and rhythm. Abdomen: Soft, nontender, no obvious distention. Musculoskeletal: No edema, cyanosis, or clubbing. Neuro: Alert, answering all questions appropriately. Cranial nerves grossly intact. Skin: No rashes or petechiae noted. Psych: Normal affect.  LAB RESULTS:  Lab Results  Component Value Date   NA 140 09/13/2024   K 4.0 09/13/2024   CL 104 09/13/2024   CO2 26 09/13/2024   GLUCOSE 118 (H) 09/13/2024   BUN 12 09/13/2024   CREATININE 0.91 09/13/2024   CALCIUM  9.9 09/13/2024   PROT 7.4 09/13/2024   ALBUMIN 4.1 09/13/2024   AST 258 (HH) 09/13/2024   ALT 273 (H) 09/13/2024   ALKPHOS 85 09/13/2024   BILITOT 0.5 09/13/2024   GFRNONAA >60 09/13/2024   GFRAA 125 03/24/2018    Lab Results  Component Value Date   WBC 5.0 09/13/2024   NEUTROABS 1.5 (L) 09/13/2024   HGB 15.2 09/13/2024   HCT  46.6 09/13/2024   MCV 90.1 09/13/2024   PLT 167 09/13/2024     STUDIES: No results found.   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 [employer] sequencing reported 5% PD-L1 as well as a high tumor mutational burden.  No other actionable mutations were reported.  PET scan results November 09, 2023 confirmed stage of disease.  MRI of the brain on December 09, 2023 did not reveal metastatic disease.  Patient completed concurrent chemotherapy with weekly carboplatinum and Taxol  and daily XRT on Feb 16, 2024.  Patient initiated his year-long maintenance durvalumab  on Mar 01, 2024.  Given patient's transaminitis, will delay cycle 8 of treatment today.  He will have repeat imaging next week and  then return to clinic in 2 weeks for repeat laboratory work and reconsideration of cycle 8 of durvalumab .    Hepatitis C:  He reports now that these finished XRT, he will schedule an appointment with a GI likely in December.   Left flank/chest wall mass: Resolved.  Biopsy-proven to be metastatic lesion.  Patient has now completed XRT with significant improvement.  Repeat imaging as above.   Pain: Patient only uses oxycodone  intermittently.  He was given a refill today. Transaminitis: Hold treatment and repeat imaging as above.  I spent a total of 30 minutes reviewing chart data, face-to-face evaluation with the patient, counseling and coordination of care as detailed above.   Patient expressed understanding and was in agreement with this plan. He also understands that He can call clinic at any time with any questions, concerns, or complaints.    Cancer Staging  Lung cancer [facility]) Staging form: Lung, AJCC V9 - Clinical: Stage IIIA (cT4, cN1, cM0) - Signed by Agrawal, Kavita, MD on 11/24/2023   Evalene JINNY Reusing, MD   09/15/2024 7:37 AM

## 2024-09-13 NOTE — Progress Notes (Signed)
 Critical value AST 258 received by lab, MD notified

## 2024-09-13 NOTE — Telephone Encounter (Signed)
 Patient called requesting a refill for Oxycodone  to [facility].  Rx pended and sent to Dr. Jacobo.

## 2024-09-14 ENCOUNTER — Other Ambulatory Visit: Payer: Self-pay

## 2024-09-15 ENCOUNTER — Encounter: Payer: Self-pay | Admitting: Internal Medicine

## 2024-09-15 ENCOUNTER — Encounter: Payer: Self-pay | Admitting: Oncology

## 2024-09-19 ENCOUNTER — Ambulatory Visit: Admitting: Family Medicine

## 2024-09-21 ENCOUNTER — Ambulatory Visit: Admission: RE | Admit: 2024-09-21 | Discharge: 2024-09-21 | Attending: Oncology | Admitting: Oncology

## 2024-09-21 DIAGNOSIS — C3491 Malignant neoplasm of unspecified part of right bronchus or lung: Secondary | ICD-10-CM

## 2024-09-21 MED ORDER — IOHEXOL 300 MG/ML  SOLN
100.0000 mL | Freq: Once | INTRAMUSCULAR | Status: AC | PRN
  Administered 2024-09-21: 100 mL via INTRAVENOUS

## 2024-09-23 ENCOUNTER — Other Ambulatory Visit: Payer: Self-pay

## 2024-09-25 ENCOUNTER — Telehealth: Payer: Self-pay | Admitting: Family Medicine

## 2024-09-25 DIAGNOSIS — E785 Hyperlipidemia, unspecified: Secondary | ICD-10-CM

## 2024-09-25 DIAGNOSIS — I10 Essential (primary) hypertension: Secondary | ICD-10-CM

## 2024-09-25 MED ORDER — AMLODIPINE BESYLATE 5 MG PO TABS: 5.0000 mg | ORAL_TABLET | Freq: Every day | ORAL | 0 refills | Status: DC

## 2024-09-25 MED ORDER — ROSUVASTATIN CALCIUM 5 MG PO TABS: 5.0000 mg | ORAL_TABLET | Freq: Every day | ORAL | 0 refills | Status: DC

## 2024-09-25 NOTE — Telephone Encounter (Signed)
 Pt had appt sch'd for Dec but it was cancelled because he was Leisa pt. He is needing a refill on rosuvastatin  (CRESTOR ) 5 MG tablet only have 1 pill left also a refill on amLODipine  (NORVASC ) 5 MG tablet. Asking that enough be sent in until he is seen by Brittany in January. Send to [email or web address]

## 2024-09-27 ENCOUNTER — Other Ambulatory Visit: Payer: Self-pay

## 2024-09-27 ENCOUNTER — Inpatient Hospital Stay

## 2024-09-27 ENCOUNTER — Inpatient Hospital Stay: Admitting: Oncology

## 2024-09-28 ENCOUNTER — Inpatient Hospital Stay

## 2024-09-28 ENCOUNTER — Inpatient Hospital Stay: Admitting: Oncology

## 2024-09-28 ENCOUNTER — Encounter: Payer: Self-pay | Admitting: Oncology

## 2024-09-28 VITALS — BP 100/69 | HR 72

## 2024-09-28 VITALS — BP 121/81 | HR 93 | Temp 97.6°F | Resp 18 | Ht 66.0 in | Wt 146.0 lb

## 2024-09-28 DIAGNOSIS — C3491 Malignant neoplasm of unspecified part of right bronchus or lung: Secondary | ICD-10-CM

## 2024-09-28 DIAGNOSIS — Z5112 Encounter for antineoplastic immunotherapy: Secondary | ICD-10-CM | POA: Diagnosis not present

## 2024-09-28 LAB — CMP (CANCER CENTER ONLY)
ALT: 148 U/L — ABNORMAL HIGH (ref 0–44)
AST: 133 U/L — ABNORMAL HIGH (ref 15–41)
Albumin: 4 g/dL (ref 3.5–5.0)
Alkaline Phosphatase: 77 U/L (ref 38–126)
Anion gap: 11 (ref 5–15)
BUN: 12 mg/dL (ref 6–20)
CO2: 27 mmol/L (ref 22–32)
Calcium: 9.6 mg/dL (ref 8.9–10.3)
Chloride: 103 mmol/L (ref 98–111)
Creatinine: 0.85 mg/dL (ref 0.61–1.24)
GFR, Estimated: >60 mL/min
Glucose, Bld: 95 mg/dL (ref 70–99)
Potassium: 4 mmol/L (ref 3.5–5.1)
Sodium: 140 mmol/L (ref 135–145)
Total Bilirubin: 0.5 mg/dL (ref 0.0–1.2)
Total Protein: 7 g/dL (ref 6.5–8.1)

## 2024-09-28 LAB — CBC WITH DIFFERENTIAL (CANCER CENTER ONLY)
Abs Immature Granulocytes: 0.01 K/uL (ref 0.00–0.07)
Basophils Absolute: 0 K/uL (ref 0.0–0.1)
Basophils Relative: 0 %
Eosinophils Absolute: 0.2 K/uL (ref 0.0–0.5)
Eosinophils Relative: 3 %
HCT: 43.1 % (ref 39.0–52.0)
Hemoglobin: 14.3 g/dL (ref 13.0–17.0)
Immature Granulocytes: 0 %
Lymphocytes Relative: 53 %
Lymphs Abs: 2.7 K/uL (ref 0.7–4.0)
MCH: 29.9 pg (ref 26.0–34.0)
MCHC: 33.2 g/dL (ref 30.0–36.0)
MCV: 90.2 fL (ref 80.0–100.0)
Monocytes Absolute: 0.6 K/uL (ref 0.1–1.0)
Monocytes Relative: 11 %
Neutro Abs: 1.7 K/uL (ref 1.7–7.7)
Neutrophils Relative %: 33 %
Platelet Count: 172 K/uL (ref 150–400)
RBC: 4.78 MIL/uL (ref 4.22–5.81)
RDW: 13.5 % (ref 11.5–15.5)
WBC Count: 5.2 K/uL (ref 4.0–10.5)
nRBC: 0 % (ref 0.0–0.2)

## 2024-09-28 MED ORDER — SODIUM CHLORIDE 0.9 % IV SOLN
1500.0000 mg | Freq: Once | INTRAVENOUS | Status: AC
  Administered 2024-09-28: 1500 mg via INTRAVENOUS
  Filled 2024-09-28: qty 30

## 2024-09-28 MED ORDER — SODIUM CHLORIDE 0.9 % IV SOLN
INTRAVENOUS | Status: DC
  Filled 2024-09-28: qty 250

## 2024-09-28 NOTE — Progress Notes (Signed)
 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  INTERVAL HISTORY: Patient returns to clinic today for further evaluation and reconsideration of cycle 8 of maintenance durvalumab .  He continues to feel well and remains asymptomatic.  He does not complain of flank pain today.  He continues to tolerate his treatments without significant side effects.  He has no neurologic complaints.  He denies any recent fevers or illnesses.  He has a good appetite and denies weight loss.  He has no chest pain, shortness of breath, cough, or hemoptysis.  He denies any abdominal pain.  He denies any nausea, vomiting, constipation, or diarrhea.  He has no urinary complaints.  Patient offers no specific complaints today.  REVIEW OF SYSTEMS:   Review of Systems  Constitutional: Negative.  Negative for fever, malaise/fatigue and weight loss.  Respiratory: Negative.  Negative for cough, hemoptysis and shortness of breath.   Cardiovascular: Negative.  Negative for chest pain and leg swelling.  Gastrointestinal: Negative.  Negative for abdominal pain.  Genitourinary:  Positive for flank pain. Negative for dysuria.  Musculoskeletal:  Negative for back pain.  Skin: Negative.  Negative for rash.  Neurological: Negative.  Negative for dizziness, focal weakness, weakness and headaches.  Psychiatric/Behavioral: Negative.  The patient is not nervous/anxious.     As per HPI. Otherwise, a complete review of systems is negative.  PAST MEDICAL HISTORY: Past Medical History:  Diagnosis Date   Alcohol abuse    Alcohol abuse, daily use 05/04/2023   Cancer of upper lobe of  right lung (HCC)    Hepatitis C    Hyperlipidemia    Hypertension    Pneumonia    x several    PAST SURGICAL HISTORY: Past Surgical History:  Procedure Laterality Date   BRONCHIAL BIOPSY  11/17/2023   Procedure: BRONCHIAL BIOPSIES;  Surgeon: Malka Domino, MD;  Location: [facility] ENDOSCOPY;  Service: Pulmonary;;   BRONCHIAL BRUSHINGS  11/17/2023   Procedure: BRONCHIAL BRUSHINGS;  Surgeon: Malka Domino, MD;  Location: [facility] ENDOSCOPY;  Service: Pulmonary;;   BRONCHIAL NEEDLE ASPIRATION BIOPSY  11/17/2023   Procedure: BRONCHIAL NEEDLE ASPIRATION BIOPSIES;  Surgeon: Malka Domino, MD;  Location: [facility] ENDOSCOPY;  Service: Pulmonary;;   BRONCHIAL WASHINGS  11/17/2023   Procedure: BRONCHIAL WASHINGS;  Surgeon: Malka Domino, MD;  Location: [facility] ENDOSCOPY;  Service: Pulmonary;;   COLONOSCOPY WITH PROPOFOL  N/A 06/28/2023   Procedure: COLONOSCOPY WITH PROPOFOL ;  Surgeon: Therisa Bi, MD;  Location[facility];  Service: Gastroenterology;  Laterality: N/A;   ENDOBRONCHIAL ULTRASOUND Bilateral 11/17/2023   Procedure: ENDOBRONCHIAL ULTRASOUND;  Surgeon: Malka Domino, MD;  Location: [facility] ENDOSCOPY;  Service: Pulmonary;  Laterality: Bilateral;   FINE NEEDLE ASPIRATION  11/17/2023   Procedure: FINE NEEDLE ASPIRATION (FNA) LINEAR;  Surgeon: Malka Domino, MD;  Location: [facility] ENDOSCOPY;  Service: Pulmonary;;   HEMOSTASIS CONTROL  11/17/2023   Procedure: HEMOSTASIS CONTROL;  Surgeon: Malka Domino, MD;  Location: [facility] ENDOSCOPY;  Service: Pulmonary;;   IR IMAGING GUIDED PORT INSERTION  12/13/2023   POLYPECTOMY  06/28/2023   Procedure: POLYPECTOMY;  Surgeon: Therisa Bi, MD;  Location: [facility] ENDOSCOPY;  Service: Gastroenterology;;   VIDEO BRONCHOSCOPY WITH RADIAL ENDOBRONCHIAL ULTRASOUND  11/17/2023   Procedure: VIDEO BRONCHOSCOPY WITH RADIAL ENDOBRONCHIAL ULTRASOUND;  Surgeon: Malka Domino, MD;  Location: [facility] ENDOSCOPY;  Service: Pulmonary;;    FAMILY HISTORY: Family History   Problem Relation Age of Onset   Hypertension Father    Heart disease Father    Heart attack Father    Heart attack Sister    Hypertension Brother    Healthy Brother    Sickle cell anemia Sister    Hypertension Mother    COPD Mother    Hypertension Maternal Grandmother    Hypertension Maternal Grandfather    Hypertension Paternal Grandmother    Hypertension Paternal Actor     ADVANCED DIRECTIVES (Y/N):  N  HEALTH MAINTENANCE: Social History   Tobacco Use   Smoking status: Former    Current packs/day: 0.50    Average packs/day: 0.5 packs/day for 25.0 years (12.5 ttl pk-yrs)    Types: Cigarettes   Smokeless tobacco: Never   Tobacco comments:    Started smoking at 58 years old.    Smoked 1 PPD at his heaviest.    Now smokes 3 cigarettes. Khj 11/09/2023  Vaping Use   Vaping status: Never Used  Substance Use Topics   Alcohol use: Not Currently    Comment: stopped drinking 11/2017   Drug use: No     Colonoscopy:  PAP:  Bone density:  Lipid panel:  No Known Allergies  Current Outpatient Medications  Medication Sig Dispense Refill   acetaminophen  (TYLENOL ) 325 MG tablet Take 650 mg by mouth every 6 (six) hours as needed.     amLODipine  (NORVASC ) 5 MG tablet Take 1 tablet (5 mg total) by mouth daily. 30 tablet 0   calcium  carbonate (OSCAL) 1500 (600 Ca) MG TABS tablet Take by mouth 2 (two) times daily with a meal.     lidocaine -prilocaine  (EMLA ) cream APPLY TO AFFECTED AREA ONCE AS DIRECTED     oxyCODONE  (OXY IR/ROXICODONE ) 5 MG immediate release tablet Take 1 tablet (5 mg total) by mouth every 6 (six) hours as needed for severe pain (pain score 7-10). 30 tablet 0   pantoprazole  (PROTONIX ) 40 MG tablet Take 1 tablet (40 mg total) by mouth daily. 90 tablet 1   rosuvastatin  (CRESTOR ) 5 MG tablet Take 1 tablet (5 mg total) by mouth daily. 30 tablet 0   No current facility-administered medications for this visit.   Facility-Administered Medications Ordered in Other  Visits  Medication Dose Route Frequency Provider Last Rate Last Admin   0.9 %  sodium chloride  infusion   Intravenous Continuous Jacobo Evalene PARAS, MD 10 mL/hr at 09/28/24 0942 New Bag at 09/28/24 0942    OBJECTIVE: Vitals:   09/28/24 0848  BP: 121/81  Pulse: 93  Resp: 18  Temp: 97.6 F (36.4 C)  SpO2: 100%      Body mass index is 23.57 kg/m.    ECOG FS:0 - Asymptomatic  General: Well-developed, well-nourished, no acute distress. Eyes: Pink conjunctiva, anicteric sclera. HEENT: Normocephalic, moist mucous membranes. Lungs: No audible wheezing or coughing. Heart: Regular rate and rhythm. Abdomen: Soft, nontender, no obvious distention. Musculoskeletal: No edema, cyanosis, or clubbing. Neuro: Alert, answering all questions appropriately. Cranial nerves grossly intact. Skin: No rashes or petechiae noted. Psych: Normal affect.  LAB RESULTS:  Lab Results  Component Value Date   NA 140 09/28/2024   K 4.0 09/28/2024   CL 103 09/28/2024   CO2 27 09/28/2024   GLUCOSE 95 09/28/2024   BUN 12 09/28/2024   CREATININE 0.85 09/28/2024   CALCIUM  9.6 09/28/2024   PROT 7.0 09/28/2024  ALBUMIN 4.0 09/28/2024   AST 133 (H) 09/28/2024   ALT 148 (H) 09/28/2024   ALKPHOS 77 09/28/2024   BILITOT 0.5 09/28/2024   GFRNONAA >60 09/28/2024   GFRAA 125 03/24/2018    Lab Results  Component Value Date   WBC 5.2 09/28/2024   NEUTROABS 1.7 09/28/2024   HGB 14.3 09/28/2024   HCT 43.1 09/28/2024   MCV 90.2 09/28/2024   PLT 172 09/28/2024     STUDIES: CT CHEST ABDOMEN PELVIS W CONTRAST Result Date: 09/24/2024 CLINICAL DATA:  Elevated enzymes, right lung cancer * Tracking Code: BO * EXAM: CT CHEST, ABDOMEN, AND PELVIS WITH CONTRAST TECHNIQUE: Multidetector CT imaging of the chest, abdomen and pelvis was performed following the standard protocol during bolus administration of intravenous contrast. RADIATION DOSE REDUCTION: This exam was performed according to the departmental  dose-optimization program which includes automated exposure control, adjustment of the mA and/or kV according to patient size and/or use of iterative reconstruction technique. CONTRAST:  OMNIPAQUE  IOHEXOL  300 MG/ML  SOLN COMPARISON:  PET-CT, 05/04/2024 FINDINGS: CT CHEST FINDINGS Cardiovascular: Left chest port catheter. Normal heart size. No pericardial effusion. Mediastinum/Nodes: No enlarged mediastinal, hilar, or axillary lymph nodes. Thyroid  gland, trachea, and esophagus demonstrate no significant findings. Lungs/Pleura: Minimal change in a mass of the lateral segment right middle lobe measuring 3.2 x 2.9 cm, previously 3.3 x 3.1 cm when measured similarly (series 4, image 74). Expected interval evolution of extensive, generally bandlike radiation fibrosis and consolidation of the right midlung. New nodule of the medial anterior left upper lobe measuring 0.7 x 0.6 cm (series 7, image 75). New nodule of the medial anterior right upper lobe measuring 0.5 cm (series 4, image 61). Unchanged irregular nodule of the posterior left upper lobe measuring 0.7 cm (series 4, image 84). Mild centrilobular and paraseptal emphysema. Diffuse bilateral bronchial wall thickening. No pleural effusion or pneumothorax. Musculoskeletal: No chest wall abnormality. No acute osseous findings. CT ABDOMEN PELVIS FINDINGS Hepatobiliary: No solid liver abnormality is seen. No gallstones, gallbladder wall thickening, or biliary dilatation. Pancreas: Unremarkable. No pancreatic ductal dilatation or surrounding inflammatory changes. Spleen: Normal in size without significant abnormality. Adrenals/Urinary Tract: Adrenal glands are unremarkable. Kidneys are normal, without renal calculi, solid lesion, or hydronephrosis. Bladder is unremarkable. Stomach/Bowel: Stomach is within normal limits. Appendix not clearly visualized. No evidence of bowel wall thickening, distention, or inflammatory changes. Vascular/Lymphatic: Aortic  atherosclerosis. No enlarged abdominal or pelvic lymph nodes. Reproductive: Prostatomegaly with median lobe hypertrophy. Other: No abdominal wall hernia or abnormality. No ascites. Musculoskeletal: No acute osseous findings. IMPRESSION: 1. Minimal change in a mass of the lateral segment right middle lobe measuring 3.2 x 2.9 cm, previously 3.3 x 3.1 cm when measured similarly. 2. Expected interval evolution of extensive, generally bandlike radiation fibrosis and consolidation of the right midlung. 3. New nodule of the medial anterior left upper lobe measuring 0.7 x 0.6 cm. New nodule of the medial anterior right upper lobe measuring 0.5 cm. These are highly suspicious for new small metastases. Given small size and location, PET-CT is likely likely to be technically limited for metabolic characterization. 4. Unchanged irregular nodule of the posterior left upper lobe measuring 0.7 cm nonspecific, although probably benign and incidental. Attention on follow-up. 5. No evidence of lymphadenopathy or metastatic disease in the abdomen or pelvis. 6. Emphysema. Aortic Atherosclerosis (ICD10-I70.0) and Emphysema (ICD10-J43.9). Electronically Signed   By: Marolyn JONETTA Jaksch M.D.   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 [employer] sequencing reported 5% PD-L1 as well as a high tumor mutational burden.  No other actionable mutations were reported.  PET scan results November 09, 2023 confirmed stage of disease.  MRI of the brain on December 09, 2023 did not reveal metastatic disease.  Patient completed concurrent chemotherapy with weekly carboplatinum and Taxol  and daily XRT on Feb 16, 2024.  Patient initiated his year-long maintenance durvalumab  on Mar 01, 2024.  Repeat CT scan on September 24, 2024 reviewed independently and report as above with 2 new lung nodules in the left and  upper right lobe, that are too small to characterize.  Will repeat CT imaging in March 2026.  Patient's transaminitis is improving, therefore we will proceed with cycle 8 of treatment today.  Return to clinic in 4 weeks for further evaluation and consideration of cycle 9.  Hepatitis C: Patient has been instructed to call GI for follow-up and consideration of treatment. Left flank/chest wall mass: Resolved.  Biopsy-proven to be metastatic lesion.  Patient has now completed XRT with significant improvement.  Repeat imaging as above.   Pain: Patient does not complain of this today.  Patient only uses oxycodone  intermittently.  Transaminitis: Improving.  Imaging as above.  Proceed with treatment as scheduled.  Follow-up with GI for hepatitis C evaluation and possible treatment.   Patient expressed understanding and was in agreement with this plan. He also understands that He can call clinic at any time with any questions, concerns, or complaints.    Cancer Staging  Lung cancer [facility]) Staging form: Lung, AJCC V9 - Clinical: Stage IIIA (cT4, cN1, cM0) - Signed by Agrawal, Kavita, MD on 11/24/2023   Evalene JINNY Reusing, MD   09/28/2024 11:19 AM     "

## 2024-09-28 NOTE — Progress Notes (Signed)
 Patient is doing good, he had a CT scan done on 09/21/2024.

## 2024-10-08 ENCOUNTER — Other Ambulatory Visit

## 2024-10-12 ENCOUNTER — Inpatient Hospital Stay

## 2024-10-12 ENCOUNTER — Inpatient Hospital Stay: Admitting: Oncology

## 2024-10-19 ENCOUNTER — Other Ambulatory Visit: Payer: Self-pay | Admitting: Nurse Practitioner

## 2024-10-19 DIAGNOSIS — E785 Hyperlipidemia, unspecified: Secondary | ICD-10-CM

## 2024-10-20 ENCOUNTER — Other Ambulatory Visit: Payer: Self-pay

## 2024-10-22 ENCOUNTER — Ambulatory Visit
Admission: RE | Admit: 2024-10-22 | Discharge: 2024-10-22 | Disposition: A | Discharge status: Home / Self Care | Source: Ambulatory Visit | Attending: Radiation Oncology | Admitting: Radiation Oncology

## 2024-10-22 ENCOUNTER — Ambulatory Visit (INDEPENDENT_AMBULATORY_CARE_PROVIDER_SITE_OTHER): Admitting: Family Medicine

## 2024-10-22 ENCOUNTER — Encounter: Payer: Self-pay | Admitting: Family Medicine

## 2024-10-22 ENCOUNTER — Encounter: Payer: Self-pay | Admitting: Radiation Oncology

## 2024-10-22 VITALS — BP 128/97 | HR 67 | Temp 98.0°F | Resp 16 | Ht 66.0 in | Wt 147.5 lb

## 2024-10-22 VITALS — BP 126/78 | HR 89 | Resp 16 | Ht 66.0 in | Wt 147.0 lb

## 2024-10-22 DIAGNOSIS — Z923 Personal history of irradiation: Secondary | ICD-10-CM | POA: Diagnosis not present

## 2024-10-22 DIAGNOSIS — C7989 Secondary malignant neoplasm of other specified sites: Secondary | ICD-10-CM | POA: Insufficient documentation

## 2024-10-22 DIAGNOSIS — E785 Hyperlipidemia, unspecified: Secondary | ICD-10-CM

## 2024-10-22 DIAGNOSIS — C342 Malignant neoplasm of middle lobe, bronchus or lung: Secondary | ICD-10-CM | POA: Insufficient documentation

## 2024-10-22 DIAGNOSIS — I10 Essential (primary) hypertension: Secondary | ICD-10-CM | POA: Diagnosis not present

## 2024-10-22 DIAGNOSIS — C3491 Malignant neoplasm of unspecified part of right bronchus or lung: Secondary | ICD-10-CM | POA: Diagnosis not present

## 2024-10-22 DIAGNOSIS — K219 Gastro-esophageal reflux disease without esophagitis: Secondary | ICD-10-CM

## 2024-10-22 DIAGNOSIS — R222 Localized swelling, mass and lump, trunk: Secondary | ICD-10-CM

## 2024-10-22 DIAGNOSIS — B182 Chronic viral hepatitis C: Secondary | ICD-10-CM | POA: Diagnosis not present

## 2024-10-22 MED ORDER — PANTOPRAZOLE SODIUM 40 MG PO TBEC: 40.0000 mg | DELAYED_RELEASE_TABLET | Freq: Every day | ORAL | 1 refills | Status: AC

## 2024-10-22 MED ORDER — AMLODIPINE BESYLATE 5 MG PO TABS: 5.0000 mg | ORAL_TABLET | Freq: Every day | ORAL | 1 refills | Status: AC

## 2024-10-22 MED ORDER — ROSUVASTATIN CALCIUM 5 MG PO TABS: 5.0000 mg | ORAL_TABLET | Freq: Every day | ORAL | 0 refills | Status: AC

## 2024-10-22 NOTE — Progress Notes (Signed)
 Radiation Oncology Follow up Note  Name: Nicholas Mccoy   Date:   10/22/2024 MRN:  [id number] DOB: 10-17-1965    This 59 y.o. male presents to the clinic today for 44-month follow-up status post SBRT to his left chest wall for metastatic lesion in patient previously treated for stage IIIa adenocarcinoma the right lung.  REFERRING PROVIDER: Leavy Mole, PA-C  HPI: Patient is a 59 year old male now out 4 months having completed SBRT to a chest wall metastatic lesion and patient previously treated for stage IIIa adenocarcinoma the right lung.  Seen today in routine follow-up he is doing well he states he is having no pain in his chest wall no cough hemoptysis or chest tightness..  He had a recent CT scan of his chest with almost complete resolution of the left chest wall mass.  He does have minimal change in the mass of the lateral segment the right middle lobe.  Does have extensive expected interval evolution of extensive radiation changes in the right midlung.  There was a new nodule in the medial anterior left upper lobe measuring 0.7 cm and a new nodule in the anterior right upper lobe measuring 0.5 cm suspicious for new small metastatic disease.  COMPLICATIONS OF TREATMENT: none  FOLLOW UP COMPLIANCE: keeps appointments   PHYSICAL EXAM:  BP (!) 128/97   Pulse 67   Temp 98 F (36.7 C)   Resp 16   Ht 5' 6 (1.676 m)   Wt 147 lb 8 oz (66.9 kg)   BMI 23.81 kg/m  Well-developed well-nourished patient in NAD. HEENT reveals PERLA, EOMI, discs not visualized.  Oral cavity is clear. No oral mucosal lesions are identified. Neck is clear without evidence of cervical or supraclavicular adenopathy. Lungs are clear to A&P. Cardiac examination is essentially unremarkable with regular rate and rhythm without murmur rub or thrill. Abdomen is benign with no organomegaly or masses noted. Motor sensory and DTR levels are equal and symmetric in the upper and lower extremities. Cranial nerves II through  XII are grossly intact. Proprioception is intact. No peripheral adenopathy or edema is identified. No motor or sensory levels are noted. Crude visual fields are within normal range.  RADIOLOGY RESULTS: CT scans reviewed compatible with above-stated findings  PLAN: At the present time patient clinically is doing well.  I will see him back in 4 months with a repeat CT scan of his chest to keep track of these new nodules of his right upper lobe as well as his left upper lobe.  Patient comprehends my recommendations well.  Patient knows to call sooner with any concerns.  I would like to take this opportunity to thank you for allowing me to participate in the care of your patient.[name] Marcey Penton, MD

## 2024-10-22 NOTE — Assessment & Plan Note (Addendum)
 HTN stable/ controlled, BP of 126/78 at today's visit.   -Continue Amlodipine  5mg  once daily, refill provided   Orders:   amLODipine  (NORVASC ) 5 MG tablet; Take 1 tablet (5 mg total) by mouth daily.

## 2024-10-22 NOTE — Assessment & Plan Note (Addendum)
 Right lung cancer, stage IIIa adenocarcinoma of the right lung, diagnosed 11/17/23. Followed by oncology, last visit 09/28/24. Continues maintenance Durvalumab , starting 03/01/24. New nodules seen on left lung on 09/24/24 CT scan, however repeat imaging planned for 12/2024.  Left chest Yeraldy Spike mass biopsy proven to be metastatic. Patient completed XRT Referred to GI for evaluation of transaminitis, noted to be improving by oncology.  -Advised to continue/ maintain scheduled follow up and other testing with oncology -Advised to follow through with GI follow up as directed by oncology

## 2024-10-22 NOTE — Assessment & Plan Note (Addendum)
 GERD symptoms controlled. Continues PPI therapy.   -Continue Pantoprazole  40mg  daily, refill provided -Advised to maintain follow up with GI Orders:   pantoprazole  (PROTONIX ) 40 MG tablet; Take 1 tablet (40 mg total) by mouth daily.

## 2024-10-22 NOTE — Assessment & Plan Note (Addendum)
 Referred by prior PCP and completed initial consult for hepatitis C evaluation on 05/16/24.   -Advised to maintain follow up with GI for management of Hepatitis C

## 2024-10-22 NOTE — Telephone Encounter (Signed)
 Requested medications are due for refill today.  yes  Requested medications are on the active medications list.  yes  Last refill. 09/25/2025  Future visit scheduled.   today  Notes to clinic.  No pcp    Requested Prescriptions  Pending Prescriptions Disp Refills   rosuvastatin  (CRESTOR ) 5 MG tablet [Pharmacy Med Name: ROSUVASTATIN  CALCIUM  5 MG TAB] 90 tablet 1    Sig: Take 1 tablet (5 mg total) by mouth daily.     Cardiovascular:  Antilipid - Statins 2 Failed - 10/22/2024 11:09 AM      Failed - Lipid Panel in normal range within the last 12 months    Cholesterol, Total  Date Value Ref Range Status  04/01/2023 182 100 - 199 mg/dL Final   Cholesterol  Date Value Ref Range Status  03/20/2024 157 <200 mg/dL Final  87/92/7984 841 0 - 200 mg/dL Final   Ldl Cholesterol, Calc  Date Value Ref Range Status  09/16/2014 83 0 - 100 mg/dL Final   LDL Cholesterol (Calc)  Date Value Ref Range Status  03/20/2024 96 mg/dL (calc) Final    Comment:    Reference range: <100 . Desirable range <100 mg/dL for primary prevention;   <70 mg/dL for patients with CHD or diabetic patients  with > or = 2 CHD risk factors. [name] LDL-C is now calculated using the Martin-Hopkins  calculation, which is a validated novel method providing  better accuracy than the Friedewald equation in the  estimation of LDL-C.  Gladis APPLETHWAITE et al. SANDREA. 7986;689(80): 2061-2068  (http://education.[employer].com/faq/FAQ164)    HDL Cholesterol  Date Value Ref Range Status  09/16/2014 67 (H) 40 - 60 mg/dL Final   HDL  Date Value Ref Range Status  03/20/2024 44 > OR = 40 mg/dL Final  93/78/7975 55 >60 mg/dL Final   Triglycerides  Date Value Ref Range Status  03/20/2024 80 <150 mg/dL Final  87/92/7984 42 0 - 200 mg/dL Final         Passed - Cr in normal range and within 360 days    Creatinine  Date Value Ref Range Status  09/28/2024 0.85 0.61 - 1.24 mg/dL Final  98/86/7983 9.05 0.60 - 1.30 mg/dL Final          Passed - Patient is not pregnant      Passed - Valid encounter within last 12 months    Recent Outpatient Visits           7 months ago Encounter for medical examination to establish care   [facility] Leavy Mole, PA-C

## 2024-10-22 NOTE — Progress Notes (Signed)
 "  Established Patient Office Visit  Subjective   Patient ID: Nicholas Mccoy, male    DOB: [birth year]  Age: 59 y.o. MRN: [id number]  Chief Complaint  Patient presents with   Transitions Of Care   Medication Refill    HPI Patient is a 59 year old male who presents for transition of care visit, also requesting medication refills. He is followed by oncology and GI. He confirms. He states he actually saw GI this morning and is being scheduled to start treatment for Hepatitis C.    Review of Systems  Constitutional:  Negative for chills and fever.  Respiratory:  Negative for shortness of breath.   Cardiovascular:  Negative for chest pain and palpitations.  Gastrointestinal:  Negative for blood in stool and heartburn.  Neurological:  Negative for dizziness and headaches.      Objective:     BP 126/78   Pulse 89   Resp 16   Ht 5' 6 (1.676 m)   Wt 147 lb (66.7 kg)   SpO2 99%   BMI 23.73 kg/m  BP Readings from Last 3 Encounters:  10/22/24 126/78  10/22/24 (!) 128/97  09/28/24 100/69   Wt Readings from Last 3 Encounters:  10/22/24 147 lb (66.7 kg)  10/22/24 147 lb 8 oz (66.9 kg)  09/28/24 146 lb (66.2 kg)      Physical Exam Constitutional:      General: He is not in acute distress.    Appearance: Normal appearance.  Cardiovascular:     Rate and Rhythm: Normal rate and regular rhythm.  Pulmonary:     Effort: Pulmonary effort is normal.     Breath sounds: Wheezing present.  Skin:    General: Skin is warm and dry.  Neurological:     General: No focal deficit present.     Mental Status: He is alert and oriented to person, place, and time.  Psychiatric:        Mood and Affect: Mood normal.        Behavior: Behavior normal.     Last CBC Lab Results  Component Value Date   WBC 5.2 09/28/2024   HGB 14.3 09/28/2024   HCT 43.1 09/28/2024   MCV 90.2 09/28/2024   MCH 29.9 09/28/2024   RDW 13.5 09/28/2024   PLT 172 09/28/2024   Last metabolic panel Lab  Results  Component Value Date   GLUCOSE 95 09/28/2024   NA 140 09/28/2024   K 4.0 09/28/2024   CL 103 09/28/2024   CO2 27 09/28/2024   BUN 12 09/28/2024   CREATININE 0.85 09/28/2024   GFRNONAA >60 09/28/2024   CALCIUM  9.6 09/28/2024   PROT 7.0 09/28/2024   ALBUMIN 4.0 09/28/2024   LABGLOB 3.9 04/28/2023   AGRATIO 1.4 03/24/2018   BILITOT 0.5 09/28/2024   ALKPHOS 77 09/28/2024   AST 133 (H) 09/28/2024   ALT 148 (H) 09/28/2024   ANIONGAP 11 09/28/2024   Last lipids Lab Results  Component Value Date   CHOL 157 03/20/2024   HDL 44 03/20/2024   LDLCALC 96 03/20/2024   TRIG 80 03/20/2024   CHOLHDL 3.6 03/20/2024   Last thyroid  functions Lab Results  Component Value Date   TSH 2.027 07/19/2024   T4TOTAL 11.8 07/19/2024   Last vitamin D  Lab Results  Component Value Date   VD25OH 37.0 04/28/2023          Assessment & Plan:   Assessment & Plan Essential hypertension HTN stable/ controlled, BP of 126/78 at  today's visit.   -Continue Amlodipine  5mg  once daily, refill provided   Orders:   amLODipine  (NORVASC ) 5 MG tablet; Take 1 tablet (5 mg total) by mouth daily.  Malignant neoplasm of right lung, unspecified part of lung (HCC) Right lung cancer, stage IIIa adenocarcinoma of the right lung, diagnosed 11/17/23. Followed by oncology, last visit 09/28/24. Continues maintenance Durvalumab , starting 03/01/24. New nodules seen on left lung on 09/24/24 CT scan, however repeat imaging planned for 12/2024.  Left chest Tashira Torre mass biopsy proven to be metastatic. Patient completed XRT Referred to GI for evaluation of transaminitis, noted to be improving by oncology.  -Advised to continue/ maintain scheduled follow up and other testing with oncology -Advised to follow through with GI follow up as directed by oncology   Chronic hepatitis C without hepatic coma (HCC) Referred by prior PCP and completed initial consult for hepatitis C evaluation on 05/16/24.   -Advised to maintain  follow up with GI for management of Hepatitis C    Dyslipidemia Dyslipidemia controlled, noting last LDL of 96 on 03/20/24. Dyslipidemia managed with statin therapy, Rosuvastatin  5mg  nightly.   -Continue Rosuvastatin  5mg  daily, refill provided -Lipid panel ordered to be completed at the hospital with his next set of labs. He reports he has monthly labs ordered by oncology   Orders:   rosuvastatin  (CRESTOR ) 5 MG tablet; Take 1 tablet (5 mg total) by mouth daily.   Lipid Profile  Gastroesophageal reflux disease, unspecified whether esophagitis present GERD symptoms controlled. Continues PPI therapy.   -Continue Pantoprazole  40mg  daily, refill provided -Advised to maintain follow up with GI Orders:   pantoprazole  (PROTONIX ) 40 MG tablet; Take 1 tablet (40 mg total) by mouth daily.      Return in about 3 months (around 01/20/2025) for Annual physical with fasting labs .    Nicholas LOISE CORE, FNP "

## 2024-10-23 ENCOUNTER — Other Ambulatory Visit: Payer: Self-pay

## 2024-10-23 ENCOUNTER — Encounter: Payer: Self-pay | Admitting: Radiation Oncology

## 2024-10-23 ENCOUNTER — Telehealth: Payer: Self-pay | Admitting: Radiation Oncology

## 2024-10-23 NOTE — Telephone Encounter (Signed)
 Called pt to sched CT - pt confirmed date/time/location - pt requested appt reminder via mail - LH

## 2024-10-25 ENCOUNTER — Encounter: Payer: Self-pay | Admitting: Oncology

## 2024-10-25 ENCOUNTER — Inpatient Hospital Stay: Attending: Oncology

## 2024-10-25 ENCOUNTER — Other Ambulatory Visit: Payer: Self-pay

## 2024-10-25 ENCOUNTER — Inpatient Hospital Stay

## 2024-10-25 ENCOUNTER — Other Ambulatory Visit: Payer: Self-pay | Admitting: *Deleted

## 2024-10-25 ENCOUNTER — Inpatient Hospital Stay (HOSPITAL_BASED_OUTPATIENT_CLINIC_OR_DEPARTMENT_OTHER): Admitting: Oncology

## 2024-10-25 VITALS — BP 112/84 | HR 98 | Temp 97.9°F | Resp 18 | Ht 66.0 in | Wt 149.0 lb

## 2024-10-25 VITALS — BP 108/83 | HR 82 | Resp 18

## 2024-10-25 DIAGNOSIS — Z87891 Personal history of nicotine dependence: Secondary | ICD-10-CM | POA: Diagnosis not present

## 2024-10-25 DIAGNOSIS — Z825 Family history of asthma and other chronic lower respiratory diseases: Secondary | ICD-10-CM | POA: Insufficient documentation

## 2024-10-25 DIAGNOSIS — Z832 Family history of diseases of the blood and blood-forming organs and certain disorders involving the immune mechanism: Secondary | ICD-10-CM | POA: Diagnosis not present

## 2024-10-25 DIAGNOSIS — B1920 Unspecified viral hepatitis C without hepatic coma: Secondary | ICD-10-CM | POA: Insufficient documentation

## 2024-10-25 DIAGNOSIS — Z5112 Encounter for antineoplastic immunotherapy: Secondary | ICD-10-CM | POA: Diagnosis present

## 2024-10-25 DIAGNOSIS — Z79620 Long term (current) use of immunosuppressive biologic: Secondary | ICD-10-CM | POA: Diagnosis not present

## 2024-10-25 DIAGNOSIS — Z79899 Other long term (current) drug therapy: Secondary | ICD-10-CM | POA: Diagnosis not present

## 2024-10-25 DIAGNOSIS — R10A0 Flank pain, unspecified side: Secondary | ICD-10-CM | POA: Diagnosis not present

## 2024-10-25 DIAGNOSIS — Z8701 Personal history of pneumonia (recurrent): Secondary | ICD-10-CM | POA: Diagnosis not present

## 2024-10-25 DIAGNOSIS — C3411 Malignant neoplasm of upper lobe, right bronchus or lung: Secondary | ICD-10-CM | POA: Insufficient documentation

## 2024-10-25 DIAGNOSIS — Z8249 Family history of ischemic heart disease and other diseases of the circulatory system: Secondary | ICD-10-CM | POA: Insufficient documentation

## 2024-10-25 DIAGNOSIS — Z8619 Personal history of other infectious and parasitic diseases: Secondary | ICD-10-CM | POA: Diagnosis not present

## 2024-10-25 DIAGNOSIS — E785 Hyperlipidemia, unspecified: Secondary | ICD-10-CM | POA: Diagnosis not present

## 2024-10-25 DIAGNOSIS — Z923 Personal history of irradiation: Secondary | ICD-10-CM | POA: Insufficient documentation

## 2024-10-25 DIAGNOSIS — C3491 Malignant neoplasm of unspecified part of right bronchus or lung: Secondary | ICD-10-CM

## 2024-10-25 DIAGNOSIS — Z9221 Personal history of antineoplastic chemotherapy: Secondary | ICD-10-CM | POA: Diagnosis not present

## 2024-10-25 DIAGNOSIS — I10 Essential (primary) hypertension: Secondary | ICD-10-CM | POA: Diagnosis not present

## 2024-10-25 LAB — CBC WITH DIFFERENTIAL (CANCER CENTER ONLY)
Abs Immature Granulocytes: 0.02 K/uL (ref 0.00–0.07)
Basophils Absolute: 0 K/uL (ref 0.0–0.1)
Basophils Relative: 0 %
Eosinophils Absolute: 0.2 K/uL (ref 0.0–0.5)
Eosinophils Relative: 5 %
HCT: 46.8 % (ref 39.0–52.0)
Hemoglobin: 15.5 g/dL (ref 13.0–17.0)
Immature Granulocytes: 0 %
Lymphocytes Relative: 52 %
Lymphs Abs: 2.5 K/uL (ref 0.7–4.0)
MCH: 30.4 pg (ref 26.0–34.0)
MCHC: 33.1 g/dL (ref 30.0–36.0)
MCV: 91.8 fL (ref 80.0–100.0)
Monocytes Absolute: 0.5 K/uL (ref 0.1–1.0)
Monocytes Relative: 11 %
Neutro Abs: 1.6 K/uL — ABNORMAL LOW (ref 1.7–7.7)
Neutrophils Relative %: 32 %
Platelet Count: 161 K/uL (ref 150–400)
RBC: 5.1 MIL/uL (ref 4.22–5.81)
RDW: 13.3 % (ref 11.5–15.5)
WBC Count: 4.9 K/uL (ref 4.0–10.5)
nRBC: 0 % (ref 0.0–0.2)

## 2024-10-25 LAB — CMP (CANCER CENTER ONLY)
ALT: 67 U/L — ABNORMAL HIGH (ref 0–44)
AST: 60 U/L — ABNORMAL HIGH (ref 15–41)
Albumin: 4.2 g/dL (ref 3.5–5.0)
Alkaline Phosphatase: 79 U/L (ref 38–126)
Anion gap: 10 (ref 5–15)
BUN: 12 mg/dL (ref 6–20)
CO2: 26 mmol/L (ref 22–32)
Calcium: 9.9 mg/dL (ref 8.9–10.3)
Chloride: 104 mmol/L (ref 98–111)
Creatinine: 0.82 mg/dL (ref 0.61–1.24)
GFR, Estimated: >60 mL/min
Glucose, Bld: 116 mg/dL — ABNORMAL HIGH (ref 70–99)
Potassium: 3.9 mmol/L (ref 3.5–5.1)
Sodium: 140 mmol/L (ref 135–145)
Total Bilirubin: 0.4 mg/dL (ref 0.0–1.2)
Total Protein: 7.5 g/dL (ref 6.5–8.1)

## 2024-10-25 LAB — TSH: TSH: 1.49 u[IU]/mL (ref 0.350–4.500)

## 2024-10-25 MED ORDER — SODIUM CHLORIDE 0.9 % IV SOLN
INTRAVENOUS | Status: DC
  Filled 2024-10-25: qty 250

## 2024-10-25 MED ORDER — SODIUM CHLORIDE 0.9 % IV SOLN
1500.0000 mg | Freq: Once | INTRAVENOUS | Status: AC
  Administered 2024-10-25: 1500 mg via INTRAVENOUS
  Filled 2024-10-25: qty 30

## 2024-10-25 MED ORDER — OXYCODONE HCL 5 MG PO TABS
5.0000 mg | ORAL_TABLET | Freq: Four times a day (QID) | ORAL | 0 refills | Status: AC | PRN
  Filled 2024-10-25: qty 30, 8d supply, fill #0

## 2024-10-25 MED ORDER — OXYCODONE HCL 5 MG PO TABS: 5.0000 mg | ORAL_TABLET | Freq: Four times a day (QID) | ORAL | 0 refills | Status: DC | PRN

## 2024-10-25 NOTE — Progress Notes (Signed)
 Patient needs a refill on his oxycodone , so I put it in and sent it for Dr. Jerone signature. He is doing well.

## 2024-10-25 NOTE — Patient Instructions (Signed)
 [facility] - A DEPT OF [facility]  Discharge Instructions: Thank you for choosing [facility] to provide your oncology and hematology care.  If you have a lab appointment with the Cancer Center, please go directly to the Cancer Center and check in at the registration area.  Wear comfortable clothing and clothing appropriate for easy access to any Portacath or PICC line.   We strive to give you quality time with your provider. You may need to reschedule your appointment if you arrive late (15 or more minutes).  Arriving late affects you and other patients whose appointments are after yours.  Also, if you miss three or more appointments without notifying the office, you may be dismissed from the clinic at the provider's discretion.      For prescription refill requests, have your pharmacy contact our office and allow 72 hours for refills to be completed.    Today you received the following chemotherapy and/or immunotherapy agents IMFINZI      To help prevent nausea and vomiting after your treatment, we encourage you to take your nausea medication as directed.  BELOW ARE SYMPTOMS THAT SHOULD BE REPORTED IMMEDIATELY: *FEVER GREATER THAN 100.4 F (38 C) OR HIGHER *CHILLS OR SWEATING *NAUSEA AND VOMITING THAT IS NOT CONTROLLED WITH YOUR NAUSEA MEDICATION *UNUSUAL SHORTNESS OF BREATH *UNUSUAL BRUISING OR BLEEDING *URINARY PROBLEMS (pain or burning when urinating, or frequent urination) *BOWEL PROBLEMS (unusual diarrhea, constipation, pain near the anus) TENDERNESS IN MOUTH AND THROAT WITH OR WITHOUT PRESENCE OF ULCERS (sore throat, sores in mouth, or a toothache) UNUSUAL RASH, SWELLING OR PAIN  UNUSUAL VAGINAL DISCHARGE OR ITCHING   Items with * indicate a potential emergency and should be followed up as soon as possible or go to the Emergency Department if any problems should occur.  Please show the CHEMOTHERAPY ALERT CARD or IMMUNOTHERAPY  ALERT CARD at check-in to the Emergency Department and triage nurse.  Should you have questions after your visit or need to cancel or reschedule your appointment, please contact [facility] - A DEPT OF [facility]  [phone] and follow the prompts.  Office hours are 8:00 a.m. to 4:30 p.m. Monday - Friday. Please note that voicemails left after 4:00 p.m. may not be returned until the following business day.  We are closed weekends and major holidays. You have access to a nurse at all times for urgent questions. Please call the main number to the clinic [phone] and follow the prompts.  For any non-urgent questions, you may also contact your provider using MyChart. We now offer e-Visits for anyone 30 and older to request care online for non-urgent symptoms. For details visit [email or web address].   Also download the MyChart app! Go to the app store, search "MyChart", open the app, select [facility], and log in with your MyChart username and password.  Durvalumab Injection What is this medication? DURVALUMAB (dur VAL ue mab) treats some types of cancer. It works by helping your immune system slow or stop the spread of cancer cells. It is a monoclonal antibody. This medicine may be used for other purposes; ask your health care provider or pharmacist if you have questions. COMMON BRAND NAME(S): IMFINZI What should I tell my care team before I take this medication? They need to know if you have any of these conditions: Allogeneic stem cell transplant (uses someone else's stem cells) Autoimmune diseases, such as Crohn disease, ulcerative  colitis, lupus History of chest radiation Nervous system problems, such as Guillain-Barre syndrome, myasthenia gravis Organ transplant An unusual or allergic reaction to durvalumab, other medications, foods, dyes, or preservatives Pregnant or trying to get pregnant Breast-feeding How should I use this medication? This  medication is infused into a vein. It is given by your care team in a hospital or clinic setting. A special MedGuide will be given to you before each treatment. Be sure to read this information carefully each time. Talk to your care team about the use of this medication in children. Special care may be needed. Overdosage: If you think you have taken too much of this medicine contact a poison control center or emergency room at once. NOTE: This medicine is only for you. Do not share this medicine with others. What if I miss a dose? Keep appointments for follow-up doses. It is important not to miss your dose. Call your care team if you are unable to keep an appointment. What may interact with this medication? Interactions have not been studied. This list may not describe all possible interactions. Give your health care provider a list of all the medicines, herbs, non-prescription drugs, or dietary supplements you use. Also tell them if you smoke, drink alcohol, or use illegal drugs. Some items may interact with your medicine. What should I watch for while using this medication? Your condition will be monitored carefully while you are receiving this medication. You may need blood work while taking this medication. This medication may cause serious skin reactions. They can happen weeks to months after starting the medication. Contact your care team right away if you notice fevers or flu-like symptoms with a rash. The rash may be red or purple and then turn into blisters or peeling of the skin. You may also notice a red rash with swelling of the face, lips, or lymph nodes in your neck or under your arms. Tell your care team right away if you have any change in your eyesight. Talk to your care team if you may be pregnant. Serious birth defects can occur if you take this medication during pregnancy and for 3 months after the last dose. You will need a negative pregnancy test before starting this medication.  Contraception is recommended while taking this medication and for 3 months after the last dose. Your care team can help you find the option that works for you. Do not breastfeed while taking this medication and for 3 months after the last dose. What side effects may I notice from receiving this medication? Side effects that you should report to your care team as soon as possible: Allergic reactions--skin rash, itching, hives, swelling of the face, lips, tongue, or throat Dry cough, shortness of breath or trouble breathing Eye pain, redness, irritation, or discharge with blurry or decreased vision Heart muscle inflammation--unusual weakness or fatigue, shortness of breath, chest pain, fast or irregular heartbeat, dizziness, swelling of the ankles, feet, or hands Hormone gland problems--headache, sensitivity to light, unusual weakness or fatigue, dizziness, fast or irregular heartbeat, increased sensitivity to cold or heat, excessive sweating, constipation, hair loss, increased thirst or amount of urine, tremors or shaking, irritability Infusion reactions--chest pain, shortness of breath or trouble breathing, feeling faint or lightheaded Kidney injury (glomerulonephritis)--decrease in the amount of urine, red or dark brown urine, foamy or bubbly urine, swelling of the ankles, hands, or feet Liver injury--right upper belly pain, loss of appetite, nausea, light-colored stool, dark yellow or brown urine, yellowing skin or eyes,  unusual weakness or fatigue Pain, tingling, or numbness in the hands or feet, muscle weakness, change in vision, confusion or trouble speaking, loss of balance or coordination, trouble walking, seizures Rash, fever, and swollen lymph nodes Redness, blistering, peeling, or loosening of the skin, including inside the mouth Sudden or severe stomach pain, bloody diarrhea, fever, nausea, vomiting Side effects that usually do not require medical attention (report these to your care team  if they continue or are bothersome): Bone, joint, or muscle pain Diarrhea Fatigue Loss of appetite Nausea Skin rash This list may not describe all possible side effects. Call your doctor for medical advice about side effects. You may report side effects to FDA at 1-800-FDA-1088. Where should I keep my medication? This medication is given in a hospital or clinic. It will not be stored at home. NOTE: This sheet is a summary. It may not cover all possible information. If you have questions about this medicine, talk to your doctor, pharmacist, or health care provider.  2024 Elsevier/Gold Standard (2022-02-09 00:00:00)

## 2024-10-25 NOTE — Progress Notes (Unsigned)
 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  INTERVAL HISTORY: Patient returns to clinic today for further evaluation and reconsideration of cycle 8 of maintenance durvalumab .  He continues to feel well and remains asymptomatic.  He does not complain of flank pain today.  He continues to tolerate his treatments without significant side effects.  He has no neurologic complaints.  He denies any recent fevers or illnesses.  He has a good appetite and denies weight loss.  He has no chest pain, shortness of breath, cough, or hemoptysis.  He denies any abdominal pain.  He denies any nausea, vomiting, constipation, or diarrhea.  He has no urinary complaints.  Patient offers no specific complaints today.  REVIEW OF SYSTEMS:   Review of Systems  Constitutional: Negative.  Negative for fever, malaise/fatigue and weight loss.  Respiratory: Negative.  Negative for cough, hemoptysis and shortness of breath.   Cardiovascular: Negative.  Negative for chest pain and leg swelling.  Gastrointestinal: Negative.  Negative for abdominal pain.  Genitourinary:  Positive for flank pain. Negative for dysuria.  Musculoskeletal:  Negative for back pain.  Skin: Negative.  Negative for rash.  Neurological: Negative.  Negative for dizziness, focal weakness, weakness and headaches.  Psychiatric/Behavioral: Negative.  The patient is not nervous/anxious.     As per HPI. Otherwise, a complete review of systems is negative.  PAST MEDICAL HISTORY: Past Medical History:  Diagnosis Date   Alcohol abuse     Alcohol abuse, daily use 05/04/2023   Biceps tendinitis 03/24/2018   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)    Cramping of feet 05/04/2023   Cramping of hands 05/04/2023   Hepatitis C    Hypercalcemia 05/04/2023   Hyperlipidemia    Hypertension    Impingement syndrome of shoulder region 03/24/2018   Pneumonia    x several   Rectal bleeding 06/28/2023    PAST SURGICAL HISTORY: Past Surgical History:  Procedure Laterality Date   BRONCHIAL BIOPSY  11/17/2023   Procedure: BRONCHIAL BIOPSIES;  Surgeon: Malka Domino, MD;  Location: [facility];  Service: Pulmonary;;   BRONCHIAL BRUSHINGS  11/17/2023   Procedure: BRONCHIAL BRUSHINGS;  Surgeon: Malka Domino, MD;  Location: [facility];  Service: Pulmonary;;   BRONCHIAL NEEDLE ASPIRATION BIOPSY  11/17/2023   Procedure: BRONCHIAL NEEDLE ASPIRATION BIOPSIES;  Surgeon: Malka Domino, MD;  Location: [facility];  Service: Pulmonary;;   BRONCHIAL WASHINGS  11/17/2023   Procedure: BRONCHIAL WASHINGS;  Surgeon: Malka Domino, MD;  Location: [facility] ENDOSCOPY;  Service: Pulmonary;;   COLONOSCOPY WITH PROPOFOL  N/A 06/28/2023   Procedure: COLONOSCOPY WITH PROPOFOL ;  Surgeon: Therisa Bi, MD;  Location: [facility] ENDOSCOPY;  Service: Gastroenterology;  Laterality: N/A;   ENDOBRONCHIAL ULTRASOUND Bilateral 11/17/2023   Procedure: ENDOBRONCHIAL ULTRASOUND;  Surgeon: Malka Domino, MD;  Location: [facility] ENDOSCOPY;  Service: Pulmonary;  Laterality: Bilateral;   FINE NEEDLE ASPIRATION  11/17/2023   Procedure: FINE NEEDLE ASPIRATION (FNA) LINEAR;  Surgeon: Malka Domino, MD;  Location: [facility];  Service: Pulmonary;;   HEMOSTASIS CONTROL  11/17/2023   Procedure: HEMOSTASIS CONTROL;  Surgeon: Malka Domino, MD;  Location: [facility] ENDOS[facility];  Service: Pulmonary;;   IR IMAGING GUIDED  PORT INSERTION  12/13/2023   POLYPECTOMY  06/28/2023   Procedure: POLYPECTOMY;  Surgeon: Therisa Bi, MD;  Location: [facility] ENDOSCOPY;   Service: Gastroenterology;;   VIDEO BRONCHOSCOPY WITH RADIAL ENDOBRONCHIAL ULTRASOUND  11/17/2023   Procedure: VIDEO BRONCHOSCOPY WITH RADIAL ENDOBRONCHIAL ULTRASOUND;  Surgeon: Malka Domino, MD;  Location: [facility] ENDOSCOPY;  Service: Pulmonary;;    FAMILY HISTORY: Family History  Problem Relation Age of Onset   Hypertension Father    Heart disease Father    Heart attack Father    Heart attack Sister    Hypertension Brother    Healthy Brother    Sickle cell anemia Sister    Hypertension Mother    COPD Mother    Hypertension Maternal Grandmother    Hypertension Maternal Grandfather    Hypertension Paternal Grandmother    Hypertension Paternal Actor     ADVANCED DIRECTIVES (Y/N):  N  HEALTH MAINTENANCE: Social History   Tobacco Use   Smoking status: Former    Current packs/day: 0.50    Average packs/day: 0.5 packs/day for 25.0 years (12.5 ttl pk-yrs)    Types: Cigarettes   Smokeless tobacco: Never   Tobacco comments:    Started smoking at 59 years old.    Smoked 1 PPD at his heaviest.    Now smokes 3 cigarettes. Khj 11/09/2023  Vaping Use   Vaping status: Never Used  Substance Use Topics   Alcohol use: Not Currently    Comment: stopped drinking 11/2017   Drug use: No     Colonoscopy:  PAP:  Bone density:  Lipid panel:  No Known Allergies  Current Outpatient Medications  Medication Sig Dispense Refill   acetaminophen  (TYLENOL ) 325 MG tablet Take 650 mg by mouth every 6 (six) hours as needed.     amLODipine  (NORVASC ) 5 MG tablet Take 1 tablet (5 mg total) by mouth daily. 90 tablet 1   calcium  carbonate (OSCAL) 1500 (600 Ca) MG TABS tablet Take by mouth 2 (two) times daily with a meal.     lidocaine -prilocaine  (EMLA ) cream APPLY TO AFFECTED AREA ONCE AS DIRECTED     oxyCODONE  (OXY IR/ROXICODONE ) 5 MG immediate release tablet Take 1 tablet (5 mg total) by mouth every 6 (six) hours as needed for severe pain (pain score 7-10). 30  tablet 0   pantoprazole  (PROTONIX ) 40 MG tablet Take 1 tablet (40 mg total) by mouth daily. 90 tablet 1   rosuvastatin  (CRESTOR ) 5 MG tablet Take 1 tablet (5 mg total) by mouth daily. 90 tablet 0   No current facility-administered medications for this visit.    OBJECTIVE: Vitals:   10/25/24 0840  BP: 112/84  Pulse: 98  Resp: 18  Temp: 97.9 F (36.6 C)  SpO2: 100%      Body mass index is 24.05 kg/m.    ECOG FS:0 - Asymptomatic  General: Well-developed, well-nourished, no acute distress. Eyes: Pink conjunctiva, anicteric sclera. HEENT: Normocephalic, moist mucous membranes. Lungs: No audible wheezing or coughing. Heart: Regular rate and rhythm. Abdomen: Soft, nontender, no obvious distention. Musculoskeletal: No edema, cyanosis, or clubbing. Neuro: Alert, answering all questions appropriately. Cranial nerves grossly intact. Skin: No rashes or petechiae noted. Psych: Normal affect.  LAB RESULTS:  Lab Results  Component Value Date   NA 140 10/25/2024   K 3.9 10/25/2024   CL 104 10/25/2024   CO2 26 10/25/2024   GLUCOSE 116 (H) 10/25/2024   BUN 12 10/25/2024   CREATININE 0.82 10/25/2024   CALCIUM  9.9 10/25/2024   PROT 7.5 10/25/2024  ALBUMIN 4.2 10/25/2024   AST 60 (H) 10/25/2024   ALT 67 (H) 10/25/2024   ALKPHOS 79 10/25/2024   BILITOT 0.4 10/25/2024   GFRNONAA >60 10/25/2024   GFRAA 125 03/24/2018    Lab Results  Component Value Date   WBC 4.9 10/25/2024   NEUTROABS 1.6 (L) 10/25/2024   HGB 15.5 10/25/2024   HCT 46.8 10/25/2024   MCV 91.8 10/25/2024   PLT 161 10/25/2024     STUDIES: No results found.    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 [employer] sequencing reported 5% PD-L1 as well as a high tumor mutational burden.  No other actionable mutations were reported.  PET scan results November 09, 2023 confirmed stage of  disease.  MRI of the brain on December 09, 2023 did not reveal metastatic disease.  Patient completed concurrent chemotherapy with weekly carboplatinum and Taxol  and daily XRT on Feb 16, 2024.  Patient initiated his year-long maintenance durvalumab  on Mar 01, 2024.  Repeat CT scan on September 24, 2024 reviewed independently and report as above with 2 new lung nodules in the left and upper right lobe, that are too small to characterize.  Will repeat CT imaging in March 2026.  Patient's transaminitis is improving, therefore we will proceed with cycle 8 of treatment today.  Return to clinic in 4 weeks for further evaluation and consideration of cycle 9.  Hepatitis C: Patient has been instructed to call GI for follow-up and consideration of treatment. Left flank/chest wall mass: Resolved.  Biopsy-proven to be metastatic lesion.  Patient has now completed XRT with significant improvement.  Repeat imaging as above.   Pain: Patient does not complain of this today.  Patient only uses oxycodone  intermittently.  Transaminitis: Improving.  Imaging as above.  Proceed with treatment as scheduled.  Follow-up with GI for hepatitis C evaluation and possible treatment.   Patient expressed understanding and was in agreement with this plan. He also understands that He can call clinic at any time with any questions, concerns, or complaints.    Cancer Staging  Lung cancer [facility]) Staging form: Lung, AJCC V9 - Clinical: Stage IIIA (cT4, cN1, cM0) - Signed by Agrawal, Kavita, MD on 11/24/2023   Evalene JINNY Reusing, MD   10/25/2024 9:04 AM     "

## 2024-10-26 ENCOUNTER — Encounter: Payer: Self-pay | Admitting: Oncology

## 2024-10-26 ENCOUNTER — Encounter: Payer: Self-pay | Admitting: Internal Medicine

## 2024-10-26 LAB — T4: T4, Total: 11.5 ug/dL (ref 4.5–12.0)

## 2024-11-06 ENCOUNTER — Ambulatory Visit: Payer: Self-pay

## 2024-11-06 NOTE — Telephone Encounter (Signed)
 FYI Only or Action Required?: Action required by provider: clinical question for provider.  Patient was last seen in primary care on 10/22/2024 by Edith, Nicholas SAILOR, FNP.  Called Nurse Triage reporting Medication Problem.  Triage Disposition: Call PCP When Office is Open  Patient/caregiver understands and will follow disposition?: Yes     Copied from CRM #[id number]. Topic: Clinical - Medication Question >> Nov 06, 2024  4:03 PM Nicholas Mccoy wrote: Reason for CRM: Patient stated that his liver dr stated that they want to take him off of his calcium  carbonate (OSCAL) 1500 (600 Ca) MG TABS tablet, because the medication he needs reacts to it so they want the ok to do so. Callback for clinic [phone] call and ask for nurse Nicholas Mccoy    Reason for Disposition  [1] Caller requesting NON-URGENT health information AND [2] PCP's office is the best resource  Protocols used: Information Only Call - No Triage-A-AH

## 2024-11-07 ENCOUNTER — Telehealth: Payer: Self-pay | Admitting: *Deleted

## 2024-11-07 NOTE — Telephone Encounter (Signed)
 Pt called in to report that his GI doctor wants to start him on Epclusa for Hep C treatment. Pt is wanting to know if he can take this new prescription while receiving immunotherapy. Also, he wants to know if he can stop taking calcium  supplement and protonix  since they will interact with his new prescription. Pt states these medications were started by Dr. DELENA when he started chemo.   Please advise.

## 2024-11-08 ENCOUNTER — Inpatient Hospital Stay: Admitting: Oncology

## 2024-11-08 ENCOUNTER — Inpatient Hospital Stay

## 2024-11-08 ENCOUNTER — Encounter: Payer: Self-pay | Admitting: Internal Medicine

## 2024-11-08 ENCOUNTER — Telehealth: Payer: Self-pay

## 2024-11-08 ENCOUNTER — Encounter: Payer: Self-pay | Admitting: Oncology

## 2024-11-08 NOTE — Telephone Encounter (Signed)
 Pt made aware.

## 2024-11-08 NOTE — Telephone Encounter (Signed)
 Copied from CRM #[id number]. Topic: Clinical - Medication Question >> Nov 08, 2024  9:49 AM Adelita E wrote: Reason for CRM: Chiquita with the [facility] department at [facility] clinic called in asking if the patient can hold his Crestor  while taking Epclusa for 84 days. Callback number for Chiquita is [phone].

## 2024-11-08 NOTE — Telephone Encounter (Signed)
 notified

## 2024-11-22 ENCOUNTER — Inpatient Hospital Stay

## 2024-11-22 ENCOUNTER — Inpatient Hospital Stay: Admitting: Oncology

## 2024-12-06 ENCOUNTER — Inpatient Hospital Stay

## 2024-12-06 ENCOUNTER — Inpatient Hospital Stay: Admitting: Oncology

## 2024-12-20 ENCOUNTER — Inpatient Hospital Stay: Admitting: Oncology

## 2024-12-20 ENCOUNTER — Inpatient Hospital Stay

## 2025-01-17 ENCOUNTER — Inpatient Hospital Stay

## 2025-01-17 ENCOUNTER — Inpatient Hospital Stay: Admitting: Oncology

## 2025-01-31 ENCOUNTER — Ambulatory Visit: Admitting: Radiation Oncology

## 2025-02-13 ENCOUNTER — Other Ambulatory Visit

## 2025-02-27 ENCOUNTER — Ambulatory Visit: Admitting: Radiation Oncology
